# Patient Record
Sex: Male | Born: 1965 | Race: White | Hispanic: No | Marital: Married | Smoking: Former smoker
Health system: Southern US, Community
[De-identification: ages and names within clinical notes are randomized; demographics above are authoritative.]

## PROBLEM LIST (undated history)

## (undated) DIAGNOSIS — A6920 Lyme disease, unspecified: Secondary | ICD-10-CM

## (undated) DIAGNOSIS — G4733 Obstructive sleep apnea (adult) (pediatric): Secondary | ICD-10-CM

## (undated) DIAGNOSIS — R944 Abnormal results of kidney function studies: Secondary | ICD-10-CM

## (undated) DIAGNOSIS — R519 Headache, unspecified: Secondary | ICD-10-CM

## (undated) DIAGNOSIS — T7840XA Allergy, unspecified, initial encounter: Secondary | ICD-10-CM

## (undated) DIAGNOSIS — D125 Benign neoplasm of sigmoid colon: Secondary | ICD-10-CM

## (undated) DIAGNOSIS — I5022 Chronic systolic (congestive) heart failure: Secondary | ICD-10-CM

## (undated) DIAGNOSIS — K219 Gastro-esophageal reflux disease without esophagitis: Secondary | ICD-10-CM

## (undated) DIAGNOSIS — D120 Benign neoplasm of cecum: Secondary | ICD-10-CM

## (undated) DIAGNOSIS — M1990 Unspecified osteoarthritis, unspecified site: Secondary | ICD-10-CM

## (undated) DIAGNOSIS — D122 Benign neoplasm of ascending colon: Secondary | ICD-10-CM

## (undated) DIAGNOSIS — Z87891 Personal history of nicotine dependence: Secondary | ICD-10-CM

## (undated) DIAGNOSIS — R7303 Prediabetes: Secondary | ICD-10-CM

## (undated) DIAGNOSIS — I428 Other cardiomyopathies: Secondary | ICD-10-CM

## (undated) DIAGNOSIS — G4730 Sleep apnea, unspecified: Secondary | ICD-10-CM

## (undated) DIAGNOSIS — Z0000 Encounter for general adult medical examination without abnormal findings: Secondary | ICD-10-CM

## (undated) DIAGNOSIS — D123 Benign neoplasm of transverse colon: Secondary | ICD-10-CM

## (undated) DIAGNOSIS — R51 Headache: Secondary | ICD-10-CM

## (undated) DIAGNOSIS — I2510 Atherosclerotic heart disease of native coronary artery without angina pectoris: Secondary | ICD-10-CM

## (undated) DIAGNOSIS — Z87442 Personal history of urinary calculi: Secondary | ICD-10-CM

## (undated) DIAGNOSIS — Q625 Duplication of ureter: Secondary | ICD-10-CM

## (undated) HISTORY — DX: Other cardiomyopathies: I42.8

## (undated) HISTORY — DX: Allergy, unspecified, initial encounter: T78.40XA

## (undated) HISTORY — DX: Benign neoplasm of transverse colon: D12.3

## (undated) HISTORY — PX: HERNIA REPAIR: SHX51

## (undated) HISTORY — PX: ESOPHAGOGASTRODUODENOSCOPY: SHX1529

## (undated) HISTORY — DX: Prediabetes: R73.03

## (undated) HISTORY — DX: Encounter for general adult medical examination without abnormal findings: Z00.00

## (undated) HISTORY — DX: Gastro-esophageal reflux disease without esophagitis: K21.9

## (undated) HISTORY — DX: Lyme disease, unspecified: A69.20

## (undated) HISTORY — DX: Benign neoplasm of sigmoid colon: D12.5

## (undated) HISTORY — DX: Chronic systolic (congestive) heart failure: I50.22

## (undated) HISTORY — PX: VASECTOMY: SHX75

## (undated) HISTORY — DX: Benign neoplasm of cecum: D12.0

## (undated) HISTORY — DX: Sleep apnea, unspecified: G47.30

## (undated) HISTORY — DX: Personal history of nicotine dependence: Z87.891

## (undated) HISTORY — PX: WISDOM TOOTH EXTRACTION: SHX21

## (undated) HISTORY — DX: Obstructive sleep apnea (adult) (pediatric): G47.33

## (undated) HISTORY — DX: Atherosclerotic heart disease of native coronary artery without angina pectoris: I25.10

## (undated) HISTORY — DX: Benign neoplasm of ascending colon: D12.2

## (undated) HISTORY — PX: CARDIAC CATHETERIZATION: SHX172

## (undated) HISTORY — DX: Abnormal results of kidney function studies: R94.4

---

## 2009-08-20 ENCOUNTER — Ambulatory Visit: Payer: Self-pay | Admitting: Internal Medicine

## 2013-08-19 ENCOUNTER — Ambulatory Visit: Payer: Self-pay | Admitting: Gastroenterology

## 2014-09-14 ENCOUNTER — Encounter: Payer: Self-pay | Admitting: Emergency Medicine

## 2014-09-14 DIAGNOSIS — Z720 Tobacco use: Secondary | ICD-10-CM | POA: Diagnosis not present

## 2014-09-14 DIAGNOSIS — R109 Unspecified abdominal pain: Secondary | ICD-10-CM | POA: Diagnosis present

## 2014-09-14 DIAGNOSIS — N200 Calculus of kidney: Secondary | ICD-10-CM | POA: Diagnosis not present

## 2014-09-14 LAB — CBC
HEMATOCRIT: 48.6 % (ref 40.0–52.0)
HEMOGLOBIN: 16.5 g/dL (ref 13.0–18.0)
MCH: 30.1 pg (ref 26.0–34.0)
MCHC: 33.9 g/dL (ref 32.0–36.0)
MCV: 88.7 fL (ref 80.0–100.0)
Platelets: 255 10*3/uL (ref 150–440)
RBC: 5.48 MIL/uL (ref 4.40–5.90)
RDW: 13.8 % (ref 11.5–14.5)
WBC: 18.3 10*3/uL — ABNORMAL HIGH (ref 3.8–10.6)

## 2014-09-14 LAB — BASIC METABOLIC PANEL
ANION GAP: 11 (ref 5–15)
BUN: 13 mg/dL (ref 6–20)
CHLORIDE: 102 mmol/L (ref 101–111)
CO2: 26 mmol/L (ref 22–32)
CREATININE: 1.31 mg/dL — AB (ref 0.61–1.24)
Calcium: 9.4 mg/dL (ref 8.9–10.3)
GFR calc Af Amer: >60 mL/min (ref >60)
Glucose, Bld: 146 mg/dL — ABNORMAL HIGH (ref 65–99)
Potassium: 4.1 mmol/L (ref 3.5–5.1)
Sodium: 139 mmol/L (ref 135–145)

## 2014-09-14 LAB — URINALYSIS COMPLETE WITH MICROSCOPIC (ARMC ONLY)
Bacteria, UA: NONE SEEN
Bilirubin Urine: NEGATIVE
Glucose, UA: NEGATIVE mg/dL
Hgb urine dipstick: NEGATIVE
Nitrite: NEGATIVE
PH: 7 (ref 5.0–8.0)
Protein, ur: 30 mg/dL — AB
Specific Gravity, Urine: 1.026 (ref 1.005–1.030)

## 2014-09-14 LAB — LIPASE, BLOOD: LIPASE: 39 U/L (ref 22–51)

## 2014-09-14 MED ORDER — KETOROLAC TROMETHAMINE 60 MG/2ML IM SOLN
INTRAMUSCULAR | Status: AC
  Filled 2014-09-14: qty 2

## 2014-09-14 MED ORDER — FENTANYL CITRATE (PF) 100 MCG/2ML IJ SOLN
50.0000 ug | Freq: Once | INTRAMUSCULAR | Status: AC
  Administered 2014-09-14: 50 ug via INTRAVENOUS
  Filled 2014-09-14: qty 2

## 2014-09-14 MED ORDER — ONDANSETRON HCL 4 MG/2ML IJ SOLN
4.0000 mg | Freq: Once | INTRAMUSCULAR | Status: AC | PRN
  Administered 2014-09-14: 4 mg via INTRAVENOUS
  Filled 2014-09-14: qty 2

## 2014-09-14 MED ORDER — KETOROLAC TROMETHAMINE 30 MG/ML IJ SOLN
30.0000 mg | Freq: Once | INTRAMUSCULAR | Status: AC
  Administered 2014-09-14: 30 mg via INTRAVENOUS

## 2014-09-14 NOTE — ED Notes (Signed)
Patient with left flank pain a couple hours ago. Patient with nausea and vomiting. Patient reports previous kidney stones and this feels the same.

## 2014-09-15 ENCOUNTER — Emergency Department
Admission: EM | Admit: 2014-09-15 | Discharge: 2014-09-15 | Disposition: A | Discharge status: Home / Self Care | Payer: 59 | Attending: Emergency Medicine | Admitting: Emergency Medicine

## 2014-09-15 ENCOUNTER — Encounter: Payer: Self-pay | Admitting: Emergency Medicine

## 2014-09-15 DIAGNOSIS — N200 Calculus of kidney: Secondary | ICD-10-CM

## 2014-09-15 MED ORDER — TAMSULOSIN HCL 0.4 MG PO CAPS: 0.4000 mg | ORAL_CAPSULE | Freq: Every day | ORAL | Status: DC

## 2014-09-15 MED ORDER — HYDROMORPHONE HCL 1 MG/ML IJ SOLN
INTRAMUSCULAR | Status: AC
  Administered 2014-09-15: 1 mg via INTRAVENOUS
  Filled 2014-09-15: qty 1

## 2014-09-15 MED ORDER — ONDANSETRON HCL 4 MG PO TABS: 4.0000 mg | ORAL_TABLET | Freq: Every day | ORAL | Status: DC | PRN

## 2014-09-15 MED ORDER — HYDROMORPHONE HCL 1 MG/ML IJ SOLN
1.0000 mg | Freq: Once | INTRAMUSCULAR | Status: AC
  Administered 2014-09-15: 1 mg via INTRAVENOUS

## 2014-09-15 MED ORDER — FENTANYL CITRATE (PF) 100 MCG/2ML IJ SOLN: 50.0000 ug | Freq: Once | INTRAMUSCULAR | Status: DC

## 2014-09-15 MED ORDER — OXYCODONE-ACETAMINOPHEN 5-325 MG PO TABS: 1.0000 | ORAL_TABLET | ORAL | Status: DC | PRN

## 2014-09-15 NOTE — ED Provider Notes (Signed)
[facility] Emergency Department Provider Note    ____________________________________________  Time seen: 1315  I have reviewed the triage vital signs and the nursing notes.   HISTORY  Chief Complaint Nephrolithiasis   History limited by: Not Limited   HPI Willie Hess is a 49 y.o. male who presents with left flank pain. He states the pain started roughly 6 hours ago. It does radiate into his groin. It is severe. It states it does remind him of his previous kidney stones. He has had some associated nausea and vomiting. Denies any fevers.     No past medical history on file.  There are no active problems to display for this patient.   No past surgical history on file.  No current outpatient prescriptions on file.  Allergies Review of patient's allergies indicates no known allergies.  No family history on file.  Social History History  Substance Use Topics  . Smoking status: Current Every Day Smoker -- 1.00 packs/day for 30 years    Types: Cigarettes  . Smokeless tobacco: Not on file  . Alcohol Use: No    Review of Systems  Constitutional: Negative for fever. Cardiovascular: Negative for chest pain. Respiratory: Negative for shortness of breath. Gastrointestinal: Left flank pain. Nausea. Vomiting. Genitourinary: Negative for dysuria. Musculoskeletal: Negative for back pain. Skin: Negative for rash. Neurological: Negative for headaches, focal weakness or numbness.   10-point ROS otherwise negative.  ____________________________________________   PHYSICAL EXAM:  VITAL SIGNS: ED Triage Vitals  Enc Vitals Group     BP 09/14/14 2144 132/77 mmHg     Pulse Rate 09/14/14 2144 68     Resp 09/14/14 2144 20     Temp 09/14/14 2144 97.9 F (36.6 C)     Temp Source 09/14/14 2144 Oral     SpO2 --      Weight 09/14/14 2144 185 lb (83.915 kg)     Height 09/14/14 2144 6\' 2"  (1.88 m)     Head Cir --      Peak Flow --      Pain  Score 09/14/14 2145 10   Constitutional: Alert and oriented. Well appearing and in no distress. Eyes: Conjunctivae are normal. PERRL. Normal extraocular movements. ENT   Head: Normocephalic and atraumatic.   Nose: No congestion/rhinnorhea.   Mouth/Throat: Mucous membranes are moist.   Neck: No stridor. Hematological/Lymphatic/Immunilogical: No cervical lymphadenopathy. Cardiovascular: Normal rate, regular rhythm.  No murmurs, rubs, or gallops. Respiratory: Normal respiratory effort without tachypnea nor retractions. Breath sounds are clear and equal bilaterally. No wheezes/rales/rhonchi. Gastrointestinal: Soft and nontender. No distention. Mild CVA tenderness on the left side. Genitourinary: Deferred Musculoskeletal: Normal range of motion in all extremities. No joint effusions.  No lower extremity tenderness nor edema. Neurologic:  Normal speech and language. No gross focal neurologic deficits are appreciated. Speech is normal.  Skin:  Skin is warm, dry and intact. No rash noted. Psychiatric: Mood and affect are normal. Speech and behavior are normal. Patient exhibits appropriate insight and judgment.  ____________________________________________    LABS (pertinent positives/negatives)  Labs Reviewed  BASIC METABOLIC PANEL - Abnormal; Notable for the following:    Glucose, Bld 146 (*)    Creatinine, Ser 1.31 (*)    All other components within normal limits  CBC - Abnormal; Notable for the following:    WBC 18.3 (*)    All other components within normal limits  URINALYSIS COMPLETEWITH MICROSCOPIC ([facility] ONLY) - Abnormal; Notable for the following:    Color, Urine  YELLOW (*)    APPearance CLEAR (*)    Ketones, ur 1+ (*)    Protein, ur 30 (*)    Leukocytes, UA TRACE (*)    Squamous Epithelial / LPF 0-5 (*)    All other components within normal limits  LIPASE, BLOOD      ____________________________________________   EKG  None  ____________________________________________    RADIOLOGY  None  ____________________________________________   PROCEDURES  Procedure(s) performed: None  Critical Care performed: No  ____________________________________________   INITIAL IMPRESSION / ASSESSMENT AND PLAN / ED COURSE  Pertinent labs & imaging results that were available during my care of the patient were reviewed by me and considered in my medical decision making (see chart for details).  Patient presents with complaints of left flank pain. He states feels similar to his previous kidney stones. Bedside ultrasound did not show any hydronephrosis. There was a kidney stone within the kidney. Blood work without any elevation of red or white blood cells. At this point will treat patient with antiemetics, pain medication and Flomax. Will give patient urology numbers for follow-up as needed.  ____________________________________________   FINAL CLINICAL IMPRESSION(S) / ED DIAGNOSES  Final diagnoses:  Kidney stone     Nance Pear, MD 09/15/14 [phone]

## 2014-09-15 NOTE — Discharge Instructions (Signed)
Please seek medical attention for any high fevers, chest pain, shortness of breath, change in behavior, persistent vomiting, bloody stool or any other new or concerning symptoms.  Dietary Guidelines to Help Prevent Kidney Stones Your risk of kidney stones can be decreased by adjusting the foods you eat. The most important thing you can do is drink enough fluid. You should drink enough fluid to keep your urine clear or pale yellow. The following guidelines provide specific information for the type of kidney stone you have had. GUIDELINES ACCORDING TO TYPE OF KIDNEY STONE Calcium Oxalate Kidney Stones  Reduce the amount of salt you eat. Foods that have a lot of salt cause your body to release excess calcium into your urine. The excess calcium can combine with a substance called oxalate to form kidney stones.  Reduce the amount of animal protein you eat if the amount you eat is excessive. Animal protein causes your body to release excess calcium into your urine. Ask your dietitian how much protein from animal sources you should be eating.  Avoid foods that are high in oxalates. If you take vitamins, they should have less than 500 mg of vitamin C. Your body turns vitamin C into oxalates. You do not need to avoid fruits and vegetables high in vitamin C. Calcium Phosphate Kidney Stones  Reduce the amount of salt you eat to help prevent the release of excess calcium into your urine.  Reduce the amount of animal protein you eat if the amount you eat is excessive. Animal protein causes your body to release excess calcium into your urine. Ask your dietitian how much protein from animal sources you should be eating.  Get enough calcium from food or take a calcium supplement (ask your dietitian for recommendations). Food sources of calcium that do not increase your risk of kidney stones include:  Broccoli.  Dairy products, such as cheese and yogurt.  Pudding. Uric Acid Kidney Stones  Do not have more  than 6 oz of animal protein per day. FOOD SOURCES Animal Protein Sources  Meat (all types).  Poultry.  Eggs.  Fish, seafood. Foods High in [employer] seasonings.  Soy sauce.  Teriyaki sauce.  Cured and processed meats.  Salted crackers and snack foods.  Fast food.  Canned soups and most canned foods. Foods High in Oxalates  Grains:  Amaranth.  Barley.  Grits.  Wheat germ.  Bran.  Buckwheat flour.  All bran cereals.  Pretzels.  Whole wheat bread.  Vegetables:  Beans (wax).  Beets and beet greens.  Collard greens.  Eggplant.  [place]ole.  Leeks.  Okra.  Parsley.  R[place]abagas.  Spinach.  Swiss chard.  Tomato paste.  Fried potatoes.  Sweet potatoes.  Fruits:  Red currants.  Figs.  Kiwi.  Rhubarb.  Meat and Other Protein Sources:  Beans (dried).  Soy burgers and other soybean products.  Miso.  Nuts (peanuts, almonds, pecans, cashews, hazelnuts).  Nut butters.  Sesame seeds and tahini (paste made of sesame seeds).  Poppy seeds.  Beverages:  Chocolate drink mixes.  Soy milk.  Instant iced tea.  Juices made from high-oxalate fruits or vegetables.  Other:  Carob.  Chocolate.  Fruitcake.  Marmalades. Document Released: 05/28/2010 Document Revised: 02/05/2013 Document Reviewed: 12/28/2012 [facility] Patient Information 2015 [place], [place]. This information is not intended to replace advice given to you by your health care provider. Make sure you discuss any questions you have with your health care provider.

## 2014-09-15 NOTE — ED Notes (Signed)
MD at bedside. 

## 2014-09-16 ENCOUNTER — Encounter: Payer: Self-pay | Admitting: Emergency Medicine

## 2014-09-16 ENCOUNTER — Emergency Department: Payer: 59

## 2014-09-16 ENCOUNTER — Emergency Department
Admission: EM | Admit: 2014-09-16 | Discharge: 2014-09-16 | Disposition: A | Discharge status: Home / Self Care | Payer: 59 | Attending: Emergency Medicine | Admitting: Emergency Medicine

## 2014-09-16 DIAGNOSIS — Z79899 Other long term (current) drug therapy: Secondary | ICD-10-CM | POA: Insufficient documentation

## 2014-09-16 DIAGNOSIS — Z720 Tobacco use: Secondary | ICD-10-CM | POA: Insufficient documentation

## 2014-09-16 DIAGNOSIS — N200 Calculus of kidney: Secondary | ICD-10-CM | POA: Diagnosis not present

## 2014-09-16 DIAGNOSIS — R109 Unspecified abdominal pain: Secondary | ICD-10-CM

## 2014-09-16 LAB — BASIC METABOLIC PANEL
Anion gap: 7 (ref 5–15)
BUN: 13 mg/dL (ref 6–20)
CHLORIDE: 107 mmol/L (ref 101–111)
CO2: 27 mmol/L (ref 22–32)
Calcium: 8.9 mg/dL (ref 8.9–10.3)
Creatinine, Ser: 1.6 mg/dL — ABNORMAL HIGH (ref 0.61–1.24)
GFR, EST AFRICAN AMERICAN: 57 mL/min — AB (ref >60)
GFR, EST NON AFRICAN AMERICAN: 49 mL/min — AB (ref >60)
GLUCOSE: 132 mg/dL — AB (ref 65–99)
POTASSIUM: 3.6 mmol/L (ref 3.5–5.1)
SODIUM: 141 mmol/L (ref 135–145)

## 2014-09-16 LAB — URINALYSIS COMPLETE WITH MICROSCOPIC (ARMC ONLY)
Bacteria, UA: NONE SEEN
Bilirubin Urine: NEGATIVE
Glucose, UA: NEGATIVE mg/dL
Hgb urine dipstick: NEGATIVE
Leukocytes, UA: NEGATIVE
Nitrite: NEGATIVE
PROTEIN: NEGATIVE mg/dL
SPECIFIC GRAVITY, URINE: 1.027 (ref 1.005–1.030)
pH: 5 (ref 5.0–8.0)

## 2014-09-16 LAB — CBC
HCT: 44 % (ref 40.0–52.0)
HEMOGLOBIN: 14.9 g/dL (ref 13.0–18.0)
MCH: 30.2 pg (ref 26.0–34.0)
MCHC: 33.8 g/dL (ref 32.0–36.0)
MCV: 89.3 fL (ref 80.0–100.0)
PLATELETS: 219 10*3/uL (ref 150–440)
RBC: 4.93 MIL/uL (ref 4.40–5.90)
RDW: 13.8 % (ref 11.5–14.5)
WBC: 10.1 10*3/uL (ref 3.8–10.6)

## 2014-09-16 MED ORDER — NAPROXEN 500 MG PO TABS: 500.0000 mg | ORAL_TABLET | Freq: Two times a day (BID) | ORAL | Status: DC

## 2014-09-16 MED ORDER — SODIUM CHLORIDE 0.9 % IV BOLUS (SEPSIS)
1000.0000 mL | Freq: Once | INTRAVENOUS | Status: AC
  Administered 2014-09-16: 1000 mL via INTRAVENOUS

## 2014-09-16 MED ORDER — KETOROLAC TROMETHAMINE 30 MG/ML IJ SOLN: 30.0000 mg | Freq: Once | INTRAMUSCULAR | Status: AC

## 2014-09-16 MED ORDER — KETOROLAC TROMETHAMINE 60 MG/2ML IM SOLN
INTRAMUSCULAR | Status: AC
Start: 2014-09-16 — End: 2014-09-16
  Administered 2014-09-16: 30 mg
  Filled 2014-09-16: qty 2

## 2014-09-16 NOTE — Discharge Instructions (Signed)
Flank Pain °Flank pain refers to pain that is located on the side of the body between the upper abdomen and the back. The pain may occur over a short period of time (acute) or may be long-term or reoccurring (chronic). It may be mild or severe. Flank pain can be caused by many things. °CAUSES  °Some of the more common causes of flank pain include: °· Muscle strains.   °· Muscle spasms.   °· A disease of your spine (vertebral disk disease).   °· A lung infection (pneumonia).   °· Fluid around your lungs (pulmonary edema).   °· A kidney infection.   °· Kidney stones.   °· A very painful skin rash caused by the chickenpox virus (shingles).   °· Gallbladder disease.   °HOME CARE INSTRUCTIONS  °Home care will depend on the cause of your pain. In general, °· Rest as directed by your caregiver. °· Drink enough fluids to keep your urine clear or pale yellow. °· Only take over-the-counter or prescription medicines as directed by your caregiver. Some medicines may help relieve the pain. °· Tell your caregiver about any changes in your pain. °· Follow up with your caregiver as directed. °SEEK IMMEDIATE MEDICAL CARE IF:  °· Your pain is not controlled with medicine.   °· You have new or worsening symptoms. °· Your pain increases.   °· You have abdominal pain.   °· You have shortness of breath.   °· You have persistent nausea or vomiting.   °· You have swelling in your abdomen.   °· You feel faint or pass out.   °· You have blood in your urine. °· You have a fever or persistent symptoms for more than 2-3 days. °· You have a fever and your symptoms suddenly get worse. °MAKE SURE YOU:  °· Understand these instructions. °· Will watch your condition. °· Will get help right away if you are not doing well or get worse. °Document Released: 03/24/2005 Document Revised: 10/26/2011 Document Reviewed: 09/15/2011 °[employer]® Patient Information ©2015 [employer]. This information is not intended to replace advice given to you by your  health care provider. Make sure you discuss any questions you have with your health care provider. ° °

## 2014-09-16 NOTE — ED Notes (Signed)
Pt comes into the ED c/o left flank pain.  Patient has a history of kidney stones x4 times.  Patient says he has been doubled over since this morning with the pain.  He has taken 4 oxycodone today with no relief.  Patient was seen here on Sunday with this diagnosis.

## 2014-09-16 NOTE — ED Provider Notes (Signed)
[facility] Emergency Department Provider Note  ____________________________________________  Time seen: 2 PM  I have reviewed the triage vital signs and the nursing notes.   HISTORY  Chief Complaint Flank Pain    HPI Willie Hess is a 49 y.o. male who presents with left flank pain. Patient wears a history of kidney stones and this feels similar. He was seen in the emergency department 2 days prior and had an ultrasound which did not show any swelling. He had been doing well but today his pain became severe and not responsive to oxycodone. No fevers no chills. No history of diabetes.He denies hematuria     Past Medical History  Diagnosis Date  . Kidney stone     There are no active problems to display for this patient.   History reviewed. No pertinent past surgical history.  Current Outpatient Rx  Name  Route  Sig  Dispense  Refill  . naproxen (NAPROSYN) 500 MG tablet   Oral   Take 1 tablet (500 mg total) by mouth 2 (two) times daily with a meal.   20 tablet   2   . ondansetron (ZOFRAN) 4 MG tablet   Oral   Take 1 tablet (4 mg total) by mouth daily as needed for nausea or vomiting.   20 tablet   0   . oxyCODONE-acetaminophen (ROXICET) 5-325 MG per tablet   Oral   Take 1 tablet by mouth every 4 (four) hours as needed for severe pain.   20 tablet   0   . tamsulosin (FLOMAX) 0.4 MG CAPS capsule   Oral   Take 1 capsule (0.4 mg total) by mouth daily.   14 capsule   0     Allergies Bee venom  No family history on file.  Social History History  Substance Use Topics  . Smoking status: Current Every Day Smoker -- 1.00 packs/day for 30 years    Types: Cigarettes  . Smokeless tobacco: Never Used  . Alcohol Use: No    Review of Systems  Constitutional: Negative for fever. Eyes: Negative for visual changes. ENT: Negative for sore throat Cardiovascular: Negative for chest pain. Respiratory: Negative for shortness of  breath. Gastrointestinal: Left flank pain Genitourinary: Negative for dysuria. Musculoskeletal: Negative for back pain. Skin: Negative for rash. Neurological: Negative for headaches  Psychiatric: No anxiety    ____________________________________________   PHYSICAL EXAM:  VITAL SIGNS: ED Triage Vitals  Enc Vitals Group     BP 09/16/14 1333 143/93 mmHg     Pulse Rate 09/16/14 1333 90     Resp 09/16/14 1333 20     Temp 09/16/14 1333 98.1 F (36.7 C)     Temp Source 09/16/14 1333 Oral     SpO2 09/16/14 1333 96 %     Weight 09/16/14 1333 185 lb (83.915 kg)     Height 09/16/14 1333 6\' 2"  (1.88 m)     Head Cir --      Peak Flow --      Pain Score 09/16/14 1333 9     Pain Loc --      Pain Edu? --      Excl. in [place]? --      Constitutional: Alert and oriented. Well appearing and in no distress. Pleasant and interactive Eyes: Conjunctivae are normal.  ENT   Head: Normocephalic and atraumatic.   Mouth/Throat: Mucous membranes are moist. Cardiovascular: Normal rate, regular rhythm. Normal and symmetric distal pulses are present in all extremities.  No murmurs, rubs, or gallops. Respiratory: Normal respiratory effort without tachypnea nor retractions. Breath sounds are clear and equal bilaterally.  Gastrointestinal: Soft and non-tender in all quadrants. No distention. There is no CVA tenderness. Genitourinary: deferred Musculoskeletal: Nontender with normal range of motion in all extremities. No lower extremity tenderness nor edema. Neurologic:  Normal speech and language. No gross focal neurologic deficits are appreciated. Skin:  Skin is warm, dry and intact. No rash noted. Psychiatric: Mood and affect are normal. Patient exhibits appropriate insight and judgment.  ____________________________________________    LABS (pertinent positives/negatives)  Labs Reviewed  BASIC METABOLIC PANEL - Abnormal; Notable for the following:    Glucose, Bld 132 (*)    Creatinine, Ser  1.60 (*)    GFR calc non Af Amer 49 (*)    GFR calc Af Amer 57 (*)    All other components within normal limits  URINALYSIS COMPLETEWITH MICROSCOPIC ([facility] ONLY) - Abnormal; Notable for the following:    Color, Urine YELLOW (*)    APPearance CLEAR (*)    Ketones, ur TRACE (*)    Squamous Epithelial / LPF 0-5 (*)    All other components within normal limits  CBC    ____________________________________________   EKG None  ____________________________________________    RADIOLOGY I have personally reviewed any xrays that were ordered on this patient: CT shows 3 mm left UVJ stone  ____________________________________________   PROCEDURES  Procedure(s) performed: none  Critical Care performed: none  ____________________________________________   INITIAL IMPRESSION / ASSESSMENT AND PLAN / ED COURSE  Pertinent labs & imaging results that were available during my care of the patient were reviewed by me and considered in my medical decision making (see chart for details).  Patient received IV Toradol in the emergency department with significant relief of his pain. He also received 1 L IV fluids. CT demonstrates 3 mm stone. The bacteria in his urine no fever normal white count. Patient is okay for discharge with return precautions given.  ____________________________________________   FINAL CLINICAL IMPRESSION(S) / ED DIAGNOSES  Final diagnoses:  Flank pain  Kidney stone     Lavonia Drafts, MD 09/16/14 1646

## 2014-09-19 ENCOUNTER — Other Ambulatory Visit: Payer: 59

## 2014-09-19 ENCOUNTER — Encounter: Payer: Self-pay | Admitting: *Deleted

## 2014-09-19 NOTE — Patient Instructions (Signed)
  Your procedure is scheduled on: 09-23-14 Report to [facility] To find out your arrival time please call [phone] between 1PM - 3PM on 09-22-14  Remember: Instructions that are not followed completely may result in serious medical risk, up to and including death, or upon the discretion of your surgeon and anesthesiologist your surgery may need to be rescheduled.    _X___ 1. Do not eat food or drink liquids after midnight. No gum chewing or hard candies.     _X___ 2. No Alcohol for 24 hours before or after surgery.   ____ 3. Bring all medications with you on the day of surgery if instructed.    ____ 4. Notify your doctor if there is any change in your medical condition     (cold, fever, infections).     Do not wear jewelry, make-up, hairpins, clips or nail polish.  Do not wear lotions, powders, or perfumes. You may wear deodorant.  Do not shave 48 hours prior to surgery. Men may shave face and neck.  Do not bring valuables to the hospital.    [facility] is not responsible for any belongings or valuables.               Contacts, dentures or bridgework may not be worn into surgery.  Leave your suitcase in the car. After surgery it may be brought to your room.  For patients admitted to the hospital, discharge time is determined by your  treatment team.   Patients discharged the day of surgery will not be allowed to drive home.   Please read over the following fact sheets that you were given:      ____ Take these medicines the morning of surgery with A SIP OF WATER:    1. NONE  2.   3.   4.  5.  6.  ____ Fleet Enema (as directed)   ____ Use CHG Soap as directed  ____ Use inhalers on the day of surgery  ____ Stop metformin 2 days prior to surgery    ____ Take 1/2 of usual insulin dose the night before surgery and none on the morning of surgery.   ____ Stop Coumadin/Plavix/aspirin-N/A  ____ Stop Anti-inflammatories-NO NSAIDS OR ASA  PRODUCTS-NUCYNTA/TYLENOL OK TO TAKE   ____ Stop supplements until after surgery.    ____ Bring C-Pap to the hospital.

## 2014-09-22 NOTE — H&P (Signed)
NAMEKEETON, KASSEBAUM                 ACCOUNT NO.:  [id number]  MEDICAL RECORD NO.:  [id number]  LOCATION:  [facility]                        FACILITY:  [facility]  PHYSICIAN:  Maryan Puls          DATE OF BIRTH:  January 20, 1966  DATE OF ADMISSION:  09/23/2014 DATE OF DISCHARGE:                            HISTORY AND PHYSICAL   CHIEF COMPLAINT:  Flank pain.  HISTORY OF PRESENT ILLNESS:  The patient is a 49 year old, Caucasian male, with sudden onset of left flank pain radiating anteriorly and associated with nausea and vomiting on July 31st.  CT scan was performed at that time, indicating presence of a 4 x 3 mm distal left ureteral stone with hydronephrosis.  Patient comes in now for left ureteroscopic ureterolithotomy with holmium laser lithotripsy.  PAST MEDICAL HISTORY:  No drug allergies.  CURRENT MEDICATIONS:  Zegerid, oxycodone, naproxen, and tamsulosin.  Previous surgical procedures included vasectomy 2011, ureteroscopic stone retrieval 1992, 1989, and 1999.  SOCIAL HISTORY:  Patient smokes a pack a day and has a 20 pack year history.  He denied alcohol use.  FAMILY HISTORY:  Remarkable for sister and father with heart disease.  PAST AND CURRENT MEDICAL CONDITIONS: 1. Allergic rhinitis. 2. Recurrent kidney stone disease.  REVIEW OF SYSTEMS:  Patient denied chest pain, shortness of breath, diabetes, stroke, or hypertension.  PHYSICAL EXAM:  General:  A well-nourished white male, in no distress.  HEENT:  Sclerae were clear.  Pupils are equally round, react to light and accommodation.  Extraocular movements were intact.  NECK:  Supple.  No palpable cervical adenopathy.  LUNGS:  Clear to auscultation.  CARDIOVASCULAR:  Regular rate without audible murmurs.  ABDOMEN:  Soft abdomen.  No abdominal tenderness.  GU AND RECTAL:  Deferred.  NEUROMUSCULAR:  Alert and oriented x3.  IMPRESSION:  Left ureterolithiasis with renal colic.  PLAN:  Left ureteroscopic ureterolithotomy  with holmium laser lithotripsy.          ______________________________ Maryan Puls     MW/MEDQ  D:  09/22/2014  T:  09/22/2014  Job:  [id number]

## 2014-09-23 ENCOUNTER — Encounter: Payer: Self-pay | Admitting: *Deleted

## 2014-09-23 ENCOUNTER — Encounter
Admission: RE | Disposition: A | Discharge status: Home / Self Care | Payer: Self-pay | Source: Ambulatory Visit | Attending: Urology

## 2014-09-23 ENCOUNTER — Ambulatory Visit
Admission: RE | Admit: 2014-09-23 | Discharge: 2014-09-23 | Disposition: A | Discharge status: Home / Self Care | Payer: 59 | Source: Ambulatory Visit | Attending: Urology | Admitting: Urology

## 2014-09-23 ENCOUNTER — Ambulatory Visit: Payer: 59 | Admitting: Anesthesiology

## 2014-09-23 DIAGNOSIS — N201 Calculus of ureter: Secondary | ICD-10-CM | POA: Diagnosis present

## 2014-09-23 DIAGNOSIS — N200 Calculus of kidney: Secondary | ICD-10-CM

## 2014-09-23 DIAGNOSIS — Z87442 Personal history of urinary calculi: Secondary | ICD-10-CM | POA: Diagnosis not present

## 2014-09-23 DIAGNOSIS — Q648 Other specified congenital malformations of urinary system: Secondary | ICD-10-CM | POA: Insufficient documentation

## 2014-09-23 DIAGNOSIS — Z79899 Other long term (current) drug therapy: Secondary | ICD-10-CM | POA: Insufficient documentation

## 2014-09-23 DIAGNOSIS — F17200 Nicotine dependence, unspecified, uncomplicated: Secondary | ICD-10-CM | POA: Insufficient documentation

## 2014-09-23 DIAGNOSIS — F17210 Nicotine dependence, cigarettes, uncomplicated: Secondary | ICD-10-CM | POA: Diagnosis not present

## 2014-09-23 DIAGNOSIS — R51 Headache: Secondary | ICD-10-CM | POA: Diagnosis not present

## 2014-09-23 DIAGNOSIS — N132 Hydronephrosis with renal and ureteral calculous obstruction: Secondary | ICD-10-CM | POA: Insufficient documentation

## 2014-09-23 DIAGNOSIS — K219 Gastro-esophageal reflux disease without esophagitis: Secondary | ICD-10-CM | POA: Insufficient documentation

## 2014-09-23 HISTORY — DX: Headache: R51

## 2014-09-23 HISTORY — PX: URETEROSCOPY WITH HOLMIUM LASER LITHOTRIPSY: SHX6645

## 2014-09-23 HISTORY — DX: Gastro-esophageal reflux disease without esophagitis: K21.9

## 2014-09-23 HISTORY — DX: Headache, unspecified: R51.9

## 2014-09-23 SURGERY — URETEROSCOPY, WITH LITHOTRIPSY USING HOLMIUM LASER
Anesthesia: General | Laterality: Left | Wound class: Clean Contaminated

## 2014-09-23 MED ORDER — FAMOTIDINE 20 MG PO TABS
20.0000 mg | ORAL_TABLET | Freq: Once | ORAL | Status: AC
  Administered 2014-09-23: 20 mg via ORAL

## 2014-09-23 MED ORDER — NUCYNTA 50 MG PO TABS
50.0000 mg | ORAL_TABLET | Freq: Four times a day (QID) | ORAL | Status: DC | PRN
Start: 2014-09-23 — End: 2014-12-11

## 2014-09-23 MED ORDER — LIDOCAINE HCL 2 % EX GEL
CUTANEOUS | Status: AC
  Filled 2014-09-23: qty 10

## 2014-09-23 MED ORDER — ONDANSETRON HCL 4 MG/2ML IJ SOLN: 4.0000 mg | Freq: Once | INTRAMUSCULAR | Status: DC | PRN

## 2014-09-23 MED ORDER — LIDOCAINE HCL (CARDIAC) 20 MG/ML IV SOLN
INTRAVENOUS | Status: DC | PRN
  Administered 2014-09-23: 60 mg via INTRAVENOUS

## 2014-09-23 MED ORDER — BELLADONNA ALKALOIDS-OPIUM 16.2-60 MG RE SUPP
RECTAL | Status: AC
  Filled 2014-09-23: qty 1

## 2014-09-23 MED ORDER — DOCUSATE SODIUM 100 MG PO CAPS: 200.0000 mg | ORAL_CAPSULE | Freq: Two times a day (BID) | ORAL | Status: DC

## 2014-09-23 MED ORDER — FENTANYL CITRATE (PF) 100 MCG/2ML IJ SOLN
INTRAMUSCULAR | Status: DC | PRN
  Administered 2014-09-23: 50 ug via INTRAVENOUS
  Administered 2014-09-23 (×4): 25 ug via INTRAVENOUS
  Administered 2014-09-23: 50 ug via INTRAVENOUS

## 2014-09-23 MED ORDER — LEVOFLOXACIN IN D5W 500 MG/100ML IV SOLN
INTRAVENOUS | Status: AC
  Filled 2014-09-23: qty 100

## 2014-09-23 MED ORDER — FENTANYL CITRATE (PF) 100 MCG/2ML IJ SOLN
INTRAMUSCULAR | Status: DC
Start: 2014-09-23 — End: 2014-09-23
  Filled 2014-09-23: qty 2

## 2014-09-23 MED ORDER — MIDAZOLAM HCL 5 MG/5ML IJ SOLN
INTRAMUSCULAR | Status: DC | PRN
  Administered 2014-09-23: 2 mg via INTRAVENOUS

## 2014-09-23 MED ORDER — FENTANYL CITRATE (PF) 100 MCG/2ML IJ SOLN
INTRAMUSCULAR | Status: AC
  Filled 2014-09-23: qty 2

## 2014-09-23 MED ORDER — HYDROMORPHONE HCL 1 MG/ML IJ SOLN
INTRAMUSCULAR | Status: DC
Start: 2014-09-23 — End: 2014-09-23
  Filled 2014-09-23: qty 1

## 2014-09-23 MED ORDER — HYOSCYAMINE SULFATE 0.125 MG SL SUBL
0.2500 mg | SUBLINGUAL_TABLET | SUBLINGUAL | Status: DC | PRN
  Administered 2014-09-23: 0.25 mg via SUBLINGUAL
  Filled 2014-09-23 (×2): qty 2

## 2014-09-23 MED ORDER — LIDOCAINE HCL 2 % EX GEL
CUTANEOUS | Status: DC | PRN
  Administered 2014-09-23: 1

## 2014-09-23 MED ORDER — SODIUM CHLORIDE 0.9 % IV SOLN
INTRAVENOUS | Status: DC | PRN
  Administered 2014-09-23: 45 mL

## 2014-09-23 MED ORDER — FAMOTIDINE 20 MG PO TABS
ORAL_TABLET | ORAL | Status: AC
  Administered 2014-09-23: 20 mg via ORAL
  Filled 2014-09-23: qty 1

## 2014-09-23 MED ORDER — PROPOFOL 10 MG/ML IV BOLUS
INTRAVENOUS | Status: DC | PRN
  Administered 2014-09-23: 30 mg via INTRAVENOUS
  Administered 2014-09-23: 170 mg via INTRAVENOUS
  Administered 2014-09-23: 30 mg via INTRAVENOUS

## 2014-09-23 MED ORDER — ONDANSETRON HCL 4 MG/2ML IJ SOLN
INTRAMUSCULAR | Status: DC | PRN
  Administered 2014-09-23: 4 mg via INTRAVENOUS

## 2014-09-23 MED ORDER — FENTANYL CITRATE (PF) 100 MCG/2ML IJ SOLN
25.0000 ug | INTRAMUSCULAR | Status: AC | PRN
  Administered 2014-09-23 (×6): 25 ug via INTRAVENOUS

## 2014-09-23 MED ORDER — LEVOFLOXACIN IN D5W 500 MG/100ML IV SOLN
500.0000 mg | Freq: Once | INTRAVENOUS | Status: AC
  Administered 2014-09-23: 500 mg via INTRAVENOUS

## 2014-09-23 MED ORDER — ONDANSETRON 8 MG PO TBDP: 8.0000 mg | ORAL_TABLET | Freq: Four times a day (QID) | ORAL | Status: DC | PRN

## 2014-09-23 MED ORDER — URIBEL 118 MG PO CAPS: 1.0000 | ORAL_CAPSULE | Freq: Four times a day (QID) | ORAL | Status: DC | PRN

## 2014-09-23 MED ORDER — SODIUM CHLORIDE 0.9 % IR SOLN
Status: DC | PRN
  Administered 2014-09-23: 3500 mL

## 2014-09-23 MED ORDER — BELLADONNA ALKALOIDS-OPIUM 16.2-60 MG RE SUPP
RECTAL | Status: DC | PRN
  Administered 2014-09-23: 1 via RECTAL

## 2014-09-23 MED ORDER — LACTATED RINGERS IV SOLN
INTRAVENOUS | Status: DC
  Administered 2014-09-23 (×2): via INTRAVENOUS

## 2014-09-23 MED ORDER — HYDROMORPHONE HCL 1 MG/ML IJ SOLN
INTRAMUSCULAR | Status: AC
  Filled 2014-09-23: qty 1

## 2014-09-23 MED ORDER — LEVOFLOXACIN IN D5W 500 MG/100ML IV SOLN: 500.0000 mg | INTRAVENOUS | Status: DC

## 2014-09-23 MED ORDER — LEVOFLOXACIN 500 MG PO TABS: 500.0000 mg | ORAL_TABLET | Freq: Every day | ORAL | Status: DC

## 2014-09-23 MED ORDER — HYDROMORPHONE HCL 1 MG/ML IJ SOLN
0.2500 mg | INTRAMUSCULAR | Status: DC | PRN
  Administered 2014-09-23 (×8): 0.25 mg via INTRAVENOUS

## 2014-09-23 SURGICAL SUPPLY — 31 items
BAG DRAIN CYSTO-URO LG1000N (MISCELLANEOUS) ×2 IMPLANT
BALLN URETL DIL 7X4 (MISCELLANEOUS) ×2
BALLOON URETL DIL 7X4 (MISCELLANEOUS) ×1 IMPLANT
CATH URETL 5X70 OPEN END (CATHETERS) ×2 IMPLANT
CNTNR SPEC 2.5X3XGRAD LEK (MISCELLANEOUS) ×1
CONRAY 43 FOR UROLOGY 50M (MISCELLANEOUS) ×2 IMPLANT
CONT SPEC 4OZ STER OR WHT (MISCELLANEOUS) ×1
CONTAINER SPEC 2.5X3XGRAD LEK (MISCELLANEOUS) ×1 IMPLANT
FEE TECHNICIAN ONLY PER HOUR (MISCELLANEOUS) IMPLANT
GLOVE BIO SURGEON STRL SZ7 (GLOVE) ×4 IMPLANT
GLOVE BIO SURGEON STRL SZ7.5 (GLOVE) ×2 IMPLANT
GOWN STRL REUS W/ TWL LRG LVL3 (GOWN DISPOSABLE) ×1 IMPLANT
GOWN STRL REUS W/TWL LRG LVL3 (GOWN DISPOSABLE) ×1
GUIDEWIRE STR ZIPWIRE 025X150 (MISCELLANEOUS) ×4 IMPLANT
GUIDEWIRE STR ZIPWIRE 035X150 (MISCELLANEOUS) ×2 IMPLANT
JELLY LUB 2OZ STRL (MISCELLANEOUS) ×1
JELLY LUBE 2OZ STRL (MISCELLANEOUS) ×1 IMPLANT
KIT RM TURNOVER CYSTO AR (KITS) ×2 IMPLANT
LASER HOLMIUM FIBER SU 272UM (MISCELLANEOUS) IMPLANT
LASER HOLMIUM STANDBY (MISCELLANEOUS) ×2 IMPLANT
LASER HOLMIUM SU 940UM (MISCELLANEOUS) IMPLANT
PACK CYSTO AR (MISCELLANEOUS) ×2 IMPLANT
PREP PVP WINGED SPONGE (MISCELLANEOUS) ×2 IMPLANT
SET CYSTO W/LG BORE CLAMP LF (SET/KITS/TRAYS/PACK) ×2 IMPLANT
SOL .9 NS 3000ML IRR  AL (IV SOLUTION) ×2
SOL .9 NS 3000ML IRR UROMATIC (IV SOLUTION) ×2 IMPLANT
SOL PREP PVP 2OZ (MISCELLANEOUS) ×2
SOLUTION PREP PVP 2OZ (MISCELLANEOUS) ×1 IMPLANT
STENT URET 6FRX26 CONTOUR (STENTS) ×4 IMPLANT
SYRINGE 10CC LL (SYRINGE) ×2 IMPLANT
WATER STERILE IRR 1000ML POUR (IV SOLUTION) ×2 IMPLANT

## 2014-09-23 NOTE — Anesthesia Procedure Notes (Signed)
Procedure Name: LMA Insertion Date/Time: 09/23/2014 2:29 PM Performed by: Dionne Bucy Pre-anesthesia Checklist: Patient identified, Patient being monitored, Timeout performed, Emergency Drugs available and Suction available Patient Re-evaluated:Patient Re-evaluated prior to inductionOxygen Delivery Method: Circle system utilized Preoxygenation: Pre-oxygenation with 100% oxygen Intubation Type: IV induction Ventilation: Mask ventilation without difficulty LMA: LMA inserted LMA Size: 5.0 Number of attempts: 1 Placement Confirmation: positive ETCO2 and breath sounds checked- equal and bilateral Tube secured with: Tape Dental Injury: Teeth and Oropharynx as per pre-operative assessment

## 2014-09-23 NOTE — Anesthesia Preprocedure Evaluation (Addendum)
Anesthesia Evaluation  Patient identified by MRN, date of birth, ID band Patient awake    Reviewed: Allergy & Precautions, H&P , NPO status , Patient's Chart, lab work & pertinent test results  Airway Mallampati: III  TM Distance: >3 FB Neck ROM: full    Dental no notable dental hx. (+) Teeth Intact   Pulmonary Current Smoker,  breath sounds clear to auscultation  Pulmonary exam normal       Cardiovascular Exercise Tolerance: Good negative cardio ROS Normal cardiovascular examRhythm:regular Rate:Normal     Neuro/Psych  Headaches, negative psych ROS   GI/Hepatic Neg liver ROS, GERD-  Controlled and Medicated,  Endo/Other  negative endocrine ROS  Renal/GU Renal disease  negative genitourinary   Musculoskeletal   Abdominal   Peds  Hematology negative hematology ROS (+)   Anesthesia Other Findings Past Medical History:   Kidney stone                                                 GERD (gastroesophageal reflux disease)                       Headache                                                     Reproductive/Obstetrics negative OB ROS                           Anesthesia Physical Anesthesia Plan  ASA: III  Anesthesia Plan: General LMA   Post-op Pain Management:    Induction:   Airway Management Planned:   Additional Equipment:   Intra-op Plan:   Post-operative Plan:   Informed Consent: I have reviewed the patients History and Physical, chart, labs and discussed the procedure including the risks, benefits and alternatives for the proposed anesthesia with the patient or authorized representative who has indicated his/her understanding and acceptance.   Dental Advisory Given  Plan Discussed with: Anesthesiologist, CRNA and Surgeon  Anesthesia Plan Comments:       Anesthesia Quick Evaluation

## 2014-09-23 NOTE — Transfer of Care (Signed)
Immediate Anesthesia Transfer of Care Note  Patient: Willie Hess  Procedure(s) Performed: Procedure(s): URETEROSCOPY, CYSTOSCOPY WITH STENT PLACEMENT, HOLMIUM STAND BY  (Left)  Patient Location: PACU  Anesthesia Type:General  Level of Consciousness: awake and patient cooperative  Airway & Oxygen Therapy: Patient Spontanous Breathing and Patient connected to face mask oxygen  Post-op Assessment: Report given to RN and Post -op Vital signs reviewed and stable  Post vital signs: Reviewed and stable  Last Vitals:  Filed Vitals:   09/23/14 1605  BP: 143/83  Pulse: 88  Temp: 97.4  Resp: 16    Complications: No apparent anesthesia complications

## 2014-09-23 NOTE — H&P (Signed)
Date of Initial H&P: 09/22/14  History reviewed, patient examined, no change in status, stable for surgery.

## 2014-09-23 NOTE — Op Note (Signed)
Preoperative diagnosis: Left ureterolithiasis Postoperative diagnosis: 1. Left ureterolithiasis                                              2. Left hydronephrosis with duplex collecting system  Procedure: 1. Left ureteroscopy                      2. Left retrograde pyelogram                       3. Placement of double pigtail stents into left duplex ureters   Surgeon: Otelia Limes. Yves Dill MD, FACS Anesthesia: Gen.  Indications:See the history and physical. After informed consent the above procedure(s) were requested     Technique and findings: After adequate general anesthesia been obtained patient was placed into dorsal lithotomy position and the perineum was prepped and draped in the usual fashion. Fluoroscopy revealed a possible distal left ureteral stone. At this point the 23 [place] the scope was coupled to the camera and then visually advanced into the bladder. Bladder was thoroughly inspected. No bladder tumors were identified. Right orifice was normally situated on the trigone and had clear reflux. The left hemitrigone was dilated and ureter dictated very distally on the trigone. A 0.025 Glidewire was then passed into the left ureter. The distal ureter was then dilated to 7 mm with balloon dilating catheter. The balloon was then deflated and the dilating catheter was removed taking care to leave the guidewire in position. The mini Stortz ureteroscope was then visually advanced into the bladder and into the distal left ureter. The patient appeared to have a duplex ureter that connected with in the intramural segment. The ureter that was balloon dilated was then inspected with the ureteroscope. There was significant edema of the intramural portion and proximal ureter was dilated. The ureteroscope was passed easily to the proximal ureter and stone was not identified. At this point the ureteroscope was packed out to the right of attachment to the duplicated ureter and a 0.025 Glidewire was passed through  this ureter. The ureteroscope was passed up this ureter and again edema was noted in the intramural segment but stone not identified all the way up to the proximal portion of this ureter. Retrograde pyelogram performed were performed of both ureters through the ureteroscope and no filling defects were identified. The cystoscope was then sequentially backloaded over either guidewire and 6 x 26 cm double pigtail stents were passed over both guidewires and positioned in both ureters under fluoroscopic guidance. Both guidewires were pulled taking care leave the stent in position. Sutures were left on the stent for later retrieval. Bladder was drained and the cystoscope was removed. 10 cc of viscous Xylocaine was instilled within the ureter and the bladder. A B&O suppository was placed. Procedure was then terminated and the patient was transferred to the recovery room in stable condition.

## 2014-09-23 NOTE — Discharge Instructions (Addendum)
Kidney Stones °Kidney stones (urolithiasis) are deposits that form inside your kidneys. The intense pain is caused by the stone moving through the urinary tract. When the stone moves, the ureter goes into spasm around the stone. The stone is usually passed in the urine.  °CAUSES  °· A disorder that makes certain neck glands produce too much parathyroid hormone (primary hyperparathyroidism). °· A buildup of uric acid crystals, similar to gout in your joints. °· Narrowing (stricture) of the ureter. °· A kidney obstruction present at birth (congenital obstruction). °· Previous surgery on the kidney or ureters. °· Numerous kidney infections. °SYMPTOMS  °· Feeling sick to your stomach (nauseous). °· Throwing up (vomiting). °· Blood in the urine (hematuria). °· Pain that usually spreads (radiates) to the groin. °· Frequency or urgency of urination. °DIAGNOSIS  °· Taking a history and physical exam. °· Blood or urine tests. °· CT scan. °· Occasionally, an examination of the inside of the urinary bladder (cystoscopy) is performed. °TREATMENT  °· Observation. °· Increasing your fluid intake. °· Extracorporeal shock wave lithotripsy--This is a noninvasive procedure that uses shock waves to break up kidney stones. °· Surgery may be needed if you have severe pain or persistent obstruction. There are various surgical procedures. Most of the procedures are performed with the use of small instruments. Only small incisions are needed to accommodate these instruments, so recovery time is minimized. °The size, location, and chemical composition are all important variables that will determine the proper choice of action for you. Talk to your health care provider to better understand your situation so that you will minimize the risk of injury to yourself and your kidney.  °HOME CARE INSTRUCTIONS  °· Drink enough water and fluids to keep your urine clear or pale yellow. This will help you to pass the stone or stone fragments. °· Strain  all urine through the provided strainer. Keep all particulate matter and stones for your health care provider to see. The stone causing the pain may be as small as a grain of salt. It is very important to use the strainer each and every time you pass your urine. The collection of your stone will allow your health care provider to analyze it and verify that a stone has actually passed. The stone analysis will often identify what you can do to reduce the incidence of recurrences. °· Only take over-the-counter or prescription medicines for pain, discomfort, or fever as directed by your health care provider. °· Make a follow-up appointment with your health care provider as directed. °· Get follow-up X-rays if required. The absence of pain does not always mean that the stone has passed. It may have only stopped moving. If the urine remains completely obstructed, it can cause loss of kidney function or even complete destruction of the kidney. It is your responsibility to make sure X-rays and follow-ups are completed. Ultrasounds of the kidney can show blockages and the status of the kidney. Ultrasounds are not associated with any radiation and can be performed easily in a matter of minutes. °SEEK MEDICAL CARE IF: °· You experience pain that is progressive and unresponsive to any pain medicine you have been prescribed. °SEEK IMMEDIATE MEDICAL CARE IF:  °· Pain cannot be controlled with the prescribed medicine. °· You have a fever or shaking chills. °· The severity or intensity of pain increases over 18 hours and is not relieved by pain medicine. °· You develop a new onset of abdominal pain. °· You feel faint or pass out. °·   You are unable to urinate. MAKE SURE YOU:   Understand these instructions.  Will watch your condition.  Will get help right away if you are not doing well or get worse. Document Released: 01/31/2005 Document Revised: 10/03/2012 Document Reviewed: 07/04/2012 [facility] Patient Information 2015  [place], [place]. This information is not intended to replace advice given to you by your health care provider. Make sure you discuss any questions you have with your health care provider. Ureteral Stent Implantation, Care After Refer to this sheet in the next few weeks. These instructions provide you with information on caring for yourself after your procedure. Your health care provider may also give you more specific instructions. Your treatment has been planned according to current medical practices, but problems sometimes occur. Call your health care provider if you have any problems or questions after your procedure. WHAT TO EXPECT AFTER THE PROCEDURE You should be back to normal activity within 48 hours after the procedure. Nausea and vomiting may occur and are commonly the result of anesthesia. It is common to experience sharp pain in the back or lower abdomen and penis with voiding. This is caused by movement of the ends of the stent with the act of urinating.It usually goes away within minutes after you have stopped urinating. HOME CARE INSTRUCTIONS Make sure to drink plenty of fluids. You may have small amounts of bleeding, causing your urine to be red. This is normal. Certain movements may trigger pain or a feeling that you need to urinate. You may be given medicines to prevent infection or bladder spasms. Be sure to take all medicines as directed. Only take over-the-counter or prescription medicines for pain, discomfort, or fever as directed by your health care provider. Do not take aspirin, as this can make bleeding worse. Your stent will be left in until the blockage is resolved. This may take 2 weeks or longer, depending on the reason for stent implantation. You may have an X-ray exam to make sure your ureter is open and that the stent has not moved out of position (migrated). The stent can be removed by your health care provider in the office. Medicines may be given for comfort while the stent  is being removed. Be sure to keep all follow-up appointments so your health care provider can check that you are healing properly. SEEK MEDICAL CARE IF:  You experience increasing pain.  Your pain medicine is not working. SEEK IMMEDIATE MEDICAL CARE IF:  Your urine is dark red or has blood clots.  You are leaking urine (incontinent).  You have a fever, chills, feeling sick to your stomach (nausea), or vomiting.  Your pain is not relieved by pain medicine.  The end of the stent comes out of the urethra.  You are unable to urinate. Document Released: 10/03/2012 Document Revised: 02/05/2013 Document Reviewed: 10/03/2012 [facility] Patient Information 2015 [place], [place]. This information is not intended to replace advice given to you by your health care provider. Make sure you discuss any questions you have with your health care provider. Ureteral Stent Implantation, Care After Refer to this sheet in the next few weeks. These instructions provide you with information on caring for yourself after your procedure. Your health care provider may also give you more specific instructions. Your treatment has been planned according to current medical practices, but problems sometimes occur. Call your health care provider if you have any problems or questions after your procedure. WHAT TO EXPECT AFTER THE PROCEDURE You should be back to normal activity  within 48 hours after the procedure. Nausea and vomiting may occur and are commonly the result of anesthesia. It is common to experience sharp pain in the back or lower abdomen and penis with voiding. This is caused by movement of the ends of the stent with the act of urinating.It usually goes away within minutes after you have stopped urinating. HOME CARE INSTRUCTIONS Make sure to drink plenty of fluids. You may have small amounts of bleeding, causing your urine to be red. This is normal. Certain movements may trigger pain or a feeling that you need to  urinate. You may be given medicines to prevent infection or bladder spasms. Be sure to take all medicines as directed. Only take over-the-counter or prescription medicines for pain, discomfort, or fever as directed by your health care provider. Do not take aspirin, as this can make bleeding worse. Your stent will be left in until the blockage is resolved. This may take 2 weeks or longer, depending on the reason for stent implantation. You may have an X-ray exam to make sure your ureter is open and that the stent has not moved out of position (migrated). The stent can be removed by your health care provider in the office. Medicines may be given for comfort while the stent is being removed. Be sure to keep all follow-up appointments so your health care provider can check that you are healing properly. SEEK MEDICAL CARE IF:  You experience increasing pain.  Your pain medicine is not working. SEEK IMMEDIATE MEDICAL CARE IF:  Your urine is dark red or has blood clots.  You are leaking urine (incontinent).  You have a fever, chills, feeling sick to your stomach (nausea), or vomiting.  Your pain is not relieved by pain medicine.  The end of the stent comes out of the urethra.  You are unable to urinate. Document Released: 10/03/2012 Document Revised: 02/05/2013 Document Reviewed: 10/03/2012 [facility] Patient Information 2015 [place], [place]. This information is not intended to replace advice given to you by your health care provider. Make sure you discuss any questions you have with your health care provider. Ureteral Stent Implantation, Care After Refer to this sheet in the next few weeks. These instructions provide you with information on caring for yourself after your procedure. Your health care provider may also give you more specific instructions. Your treatment has been planned according to current medical practices, but problems sometimes occur. Call your health care provider if you have any  problems or questions after your procedure. WHAT TO EXPECT AFTER THE PROCEDURE You should be back to normal activity within 48 hours after the procedure. Nausea and vomiting may occur and are commonly the result of anesthesia. It is common to experience sharp pain in the back or lower abdomen and penis with voiding. This is caused by movement of the ends of the stent with the act of urinating.It usually goes away within minutes after you have stopped urinating. HOME CARE INSTRUCTIONS Make sure to drink plenty of fluids. You may have small amounts of bleeding, causing your urine to be red. This is normal. Certain movements may trigger pain or a feeling that you need to urinate. You may be given medicines to prevent infection or bladder spasms. Be sure to take all medicines as directed. Only take over-the-counter or prescription medicines for pain, discomfort, or fever as directed by your health care provider. Do not take aspirin, as this can make bleeding worse. Your stent will be left in until the blockage is  resolved. This may take 2 weeks or longer, depending on the reason for stent implantation. You may have an X-ray exam to make sure your ureter is open and that the stent has not moved out of position (migrated). The stent can be removed by your health care provider in the office. Medicines may be given for comfort while the stent is being removed. Be sure to keep all follow-up appointments so your health care provider can check that you are healing properly. SEEK MEDICAL CARE IF:  You experience increasing pain.  Your pain medicine is not working. SEEK IMMEDIATE MEDICAL CARE IF:  Your urine is dark red or has blood clots.  You are leaking urine (incontinent).  You have a fever, chills, feeling sick to your stomach (nausea), or vomiting.  Your pain is not relieved by pain medicine.  The end of the stent comes out of the urethra.  You are unable to urinate. AMBULATORY SURGERY  DISCHARGE  INSTRUCTIONS   1) The drugs that you were given will stay in your system until tomorrow so for the next 24 hours you should not:  A) Drive an automobile B) Make any legal decisions C) Drink any alcoholic beverage   2) You may resume regular meals tomorrow.  Today it is better to start with liquids and gradually work up to solid foods.  You may eat anything you prefer, but it is better to start with liquids, then soup and crackers, and gradually work up to solid foods.   3) Please notify your doctor immediately if you have any unusual bleeding, trouble breathing, redness and pain at the surgery site, drainage, fever, or pain not relieved by medication.    4) Additional Instructions:  Call the office to schedule a return appointment for one week from today to have stents removed.   Please contact your physician with any problems or Same Day Surgery at [phone], Monday through Friday 6 am to 4 pm, or [place] at [facility] number at [phone].

## 2014-09-24 ENCOUNTER — Encounter: Payer: Self-pay | Admitting: Urology

## 2014-09-24 NOTE — Anesthesia Postprocedure Evaluation (Signed)
  Anesthesia Post-op Note  Patient: Willie Hess  Procedure(s) Performed: Procedure(s): URETEROSCOPY, CYSTOSCOPY WITH STENT PLACEMENT, HOLMIUM STAND BY  (Left)  Anesthesia type:General LMA  Patient location: PACU  Post pain: Pain level controlled  Post assessment: Post-op Vital signs reviewed, Patient's Cardiovascular Status Stable, Respiratory Function Stable, Patent Airway and No signs of Nausea or vomiting  Post vital signs: Reviewed and stable  Last Vitals:  Filed Vitals:   09/23/14 1824  BP: 131/78  Pulse: 75  Temp:   Resp: 16    Level of consciousness: awake, alert  and patient cooperative  Complications: No apparent anesthesia complications

## 2014-11-17 ENCOUNTER — Telehealth: Payer: Self-pay | Admitting: *Deleted

## 2014-11-17 NOTE — Telephone Encounter (Signed)
  Oncology Nurse Navigator Documentation    Navigator Encounter Type: Telephone;Screening (11/17/14 1200)      Notified patient that due to age being below the target age of 64-77 for lung cancer screening, he does not meet criteria for being at higher risk for lung cancer and being eligible for lung cancer screening scan. Encouraged to participate in smoking cessation program offered by the hospital and to have screening scan when he is 49 years old. Will forward to referring provider.

## 2014-12-11 ENCOUNTER — Encounter (INDEPENDENT_AMBULATORY_CARE_PROVIDER_SITE_OTHER): Payer: Self-pay

## 2014-12-11 ENCOUNTER — Ambulatory Visit (INDEPENDENT_AMBULATORY_CARE_PROVIDER_SITE_OTHER): Payer: 59 | Admitting: Family Medicine

## 2014-12-11 ENCOUNTER — Encounter: Payer: Self-pay | Admitting: Family Medicine

## 2014-12-11 VITALS — BP 130/76 | HR 85 | Temp 98.3°F | Resp 16 | Ht 74.0 in | Wt 186.6 lb

## 2014-12-11 DIAGNOSIS — R944 Abnormal results of kidney function studies: Secondary | ICD-10-CM | POA: Diagnosis not present

## 2014-12-11 DIAGNOSIS — Z720 Tobacco use: Secondary | ICD-10-CM

## 2014-12-11 DIAGNOSIS — Z87442 Personal history of urinary calculi: Secondary | ICD-10-CM

## 2014-12-11 DIAGNOSIS — Z23 Encounter for immunization: Secondary | ICD-10-CM

## 2014-12-11 DIAGNOSIS — F17210 Nicotine dependence, cigarettes, uncomplicated: Secondary | ICD-10-CM | POA: Insufficient documentation

## 2014-12-11 DIAGNOSIS — K219 Gastro-esophageal reflux disease without esophagitis: Secondary | ICD-10-CM

## 2014-12-11 DIAGNOSIS — IMO0001 Reserved for inherently not codable concepts without codable children: Secondary | ICD-10-CM | POA: Insufficient documentation

## 2014-12-11 DIAGNOSIS — Z716 Tobacco abuse counseling: Secondary | ICD-10-CM | POA: Diagnosis not present

## 2014-12-11 HISTORY — DX: Gastro-esophageal reflux disease without esophagitis: K21.9

## 2014-12-11 HISTORY — DX: Abnormal results of kidney function studies: R94.4

## 2014-12-11 MED ORDER — BUPROPION HCL ER (SR) 150 MG PO TB12: 150.0000 mg | ORAL_TABLET | Freq: Two times a day (BID) | ORAL | Status: DC

## 2014-12-11 NOTE — Progress Notes (Signed)
Name: Willie Hess   MRN: [id number]    DOB: Jul 06, 1965   Date:12/11/2014       Progress Note  Subjective  Chief Complaint  Chief Complaint  Patient presents with  . Establish Care    HPI   Willie Hess is a 49 y.o. male here today to transition care of medical needs to a primary care provider. He reports a recent history of kidney stone and was seen in the local ER otherwise he has some acid reflux. No complaints or concerns expressed today. He is a heavy cigarette smoker, about 1ppd most days but sometimes more. Started age 13 yo. Has tried to quit cold [place] on his own several times but he gets quite moody. Has a strong family hx of cardiac issues so had comprehensive cardiovascular testing done in 2014 which was all normal.    Past Medical History  Diagnosis Date  . Kidney stone   . GERD (gastroesophageal reflux disease)   . Headache     Patient Active Problem List   Diagnosis Date Noted  . GERD without esophagitis 12/11/2014  . History of kidney stones 12/11/2014    Social History  Substance Use Topics  . Smoking status: Current Every Day Smoker -- 1.00 packs/day for 30 years    Types: Cigarettes  . Smokeless tobacco: Never Used  . Alcohol Use: No     Current outpatient prescriptions:  .  Omeprazole-Sodium Bicarbonate (ZEGERID) 20-1100 MG CAPS capsule, Take 1 capsule by mouth 2 (two) times daily., Disp: , Rfl:   Past Surgical History  Procedure Laterality Date  . Esophagogastroduodenoscopy    . Wisdom tooth extraction    . Ureteroscopy with holmium laser lithotripsy Left 09/23/2014    Procedure: URETEROSCOPY, CYSTOSCOPY WITH STENT PLACEMENT, HOLMIUM STAND BY ;  Surgeon: Royston Cowper, MD;  Location: [facility] ORS;  Service: Urology;  Laterality: Left;    No family history on file.  Allergies  Allergen Reactions  . Bee Venom Swelling     Review of Systems  CONSTITUTIONAL: No significant weight changes, fever, chills, weakness or fatigue.  HEENT:  -  Eyes: No visual changes.  - Ears: No auditory changes. No pain.  - Nose: No sneezing, congestion, runny nose. - Throat: No sore throat. No changes in swallowing. SKIN: No rash or itching.  CARDIOVASCULAR: No chest pain, chest pressure or chest discomfort. No palpitations or edema.  RESPIRATORY: No shortness of breath, cough or sputum.  GASTROINTESTINAL: No anorexia, nausea, vomiting. No changes in bowel habits. No abdominal pain or blood.  GENITOURINARY: No dysuria. No frequency. No discharge.  NEUROLOGICAL: No headache, dizziness, syncope, paralysis, ataxia, numbness or tingling in the extremities. No memory changes. No change in bowel or bladder control.  MUSCULOSKELETAL: No joint pain. No muscle pain. HEMATOLOGIC: No anemia, bleeding or bruising.  LYMPHATICS: No enlarged lymph nodes.  PSYCHIATRIC: No change in mood. No change in sleep pattern.  ENDOCRINOLOGIC: No reports of sweating, cold or heat intolerance. No polyuria or polydipsia.     Objective  BP 130/76 mmHg  Pulse 85  Temp(Src) 98.3 F (36.8 C) (Oral)  Resp 16  Ht _0  (1.88 m)  Wt 186 lb 9.6 oz (84.641 kg)  BMI 23.95 kg/m2  SpO2 97% Body mass index is 23.95 kg/(m^2).  Physical Exam  Constitutional: Patient appears well-developed and well-nourished. In no distress.  HEENT:  - Head: Normocephalic and atraumatic.  - Ears: Bilateral TMs gray, no erythema or effusion - Nose: Nasal mucosa moist -  Mouth/Throat: Oropharynx is clear and moist. No tonsillar hypertrophy or erythema. No post nasal drainage.  - Eyes: Conjunctivae clear, EOM movements normal. PERRLA. No scleral icterus.  Neck: Normal range of motion. Neck supple. No JVD present. No thyromegaly present.  Cardiovascular: Normal rate, regular rhythm and normal heart sounds.  No murmur heard.  Pulmonary/Chest: Effort normal and breath sounds normal. No respiratory distress. Musculoskeletal: Normal range of motion bilateral UE and LE, no joint  effusions. Peripheral vascular: Bilateral LE no edema. Neurological: CN II-XII grossly intact with no focal deficits. Alert and oriented to person, place, and time. Coordination, balance, strength, speech and gait are normal.  Skin: Skin is warm and dry. No rash noted. No erythema.  Psychiatric: Patient has a normal mood and affect. Behavior is normal in office today. Judgment and thought content normal in office today.   Recent Results (from the past 2160 hour(s))  Basic metabolic panel     Status: Abnormal   Collection Time: 09/14/14  9:49 PM  Result Value Ref Range   Sodium 139 135 - 145 mmol/L   Potassium 4.1 3.5 - 5.1 mmol/L   Chloride 102 101 - 111 mmol/L   CO2 26 22 - 32 mmol/L   Glucose, Bld 146 (H) 65 - 99 mg/dL   BUN 13 6 - 20 mg/dL   Creatinine, Ser 1.31 (H) 0.61 - 1.24 mg/dL   Calcium 9.4 8.9 - 10.3 mg/dL   GFR calc non Af Amer >60 >60 mL/min   GFR calc Af Amer >60 >60 mL/min    Comment: (NOTE) The eGFR has been calculated using the CKD EPI equation. This calculation has not been validated in all clinical situations. eGFR's persistently <60 mL/min signify possible Chronic Kidney Disease.    Anion gap 11 5 - 15  CBC     Status: Abnormal   Collection Time: 09/14/14  9:49 PM  Result Value Ref Range   WBC 18.3 (H) 3.8 - 10.6 K/uL   RBC 5.48 4.40 - 5.90 MIL/uL   Hemoglobin 16.5 13.0 - 18.0 g/dL   HCT 48.6 40.0 - 52.0 %   MCV 88.7 80.0 - 100.0 fL   MCH 30.1 26.0 - 34.0 pg   MCHC 33.9 32.0 - 36.0 g/dL   RDW 13.8 11.5 - 14.5 %   Platelets 255 150 - 440 K/uL  Urinalysis complete, with microscopic- may I&O cath if menses [facility] only)     Status: Abnormal   Collection Time: 09/14/14  9:49 PM  Result Value Ref Range   Color, Urine YELLOW (A) YELLOW   APPearance CLEAR (A) CLEAR   Glucose, UA NEGATIVE NEGATIVE mg/dL   Bilirubin Urine NEGATIVE NEGATIVE   Ketones, ur 1+ (A) NEGATIVE mg/dL   Specific Gravity, Urine 1.026 1.005 - 1.030   Hgb urine dipstick NEGATIVE NEGATIVE    pH 7.0 5.0 - 8.0   Protein, ur 30 (A) NEGATIVE mg/dL   Nitrite NEGATIVE NEGATIVE   Leukocytes, UA TRACE (A) NEGATIVE   RBC / HPF 0-5 0 - 5 RBC/hpf   WBC, UA 0-5 0 - 5 WBC/hpf   Bacteria, UA NONE SEEN NONE SEEN   Squamous Epithelial / LPF 0-5 (A) NONE SEEN   Mucous PRESENT   Lipase, blood     Status: None   Collection Time: 09/14/14  9:49 PM  Result Value Ref Range   Lipase 39 22 - 51 U/L  CBC     Status: None   Collection Time: 09/16/14  1:37 PM  Result  Value Ref Range   WBC 10.1 3.8 - 10.6 K/uL   RBC 4.93 4.40 - 5.90 MIL/uL   Hemoglobin 14.9 13.0 - 18.0 g/dL   HCT 44.0 40.0 - 52.0 %   MCV 89.3 80.0 - 100.0 fL   MCH 30.2 26.0 - 34.0 pg   MCHC 33.8 32.0 - 36.0 g/dL   RDW 13.8 11.5 - 14.5 %   Platelets 219 150 - 440 K/uL  Basic metabolic panel     Status: Abnormal   Collection Time: 09/16/14  1:37 PM  Result Value Ref Range   Sodium 141 135 - 145 mmol/L   Potassium 3.6 3.5 - 5.1 mmol/L   Chloride 107 101 - 111 mmol/L   CO2 27 22 - 32 mmol/L   Glucose, Bld 132 (H) 65 - 99 mg/dL   BUN 13 6 - 20 mg/dL   Creatinine, Ser 1.60 (H) 0.61 - 1.24 mg/dL   Calcium 8.9 8.9 - 10.3 mg/dL   GFR calc non Af Amer 49 (L) >60 mL/min   GFR calc Af Amer 57 (L) >60 mL/min    Comment: (NOTE) The eGFR has been calculated using the CKD EPI equation. This calculation has not been validated in all clinical situations. eGFR's persistently <60 mL/min signify possible Chronic Kidney Disease.    Anion gap 7 5 - 15  Urinalysis complete, with microscopic- may I&O cath if menses [facility] only)     Status: Abnormal   Collection Time: 09/16/14  1:37 PM  Result Value Ref Range   Color, Urine YELLOW (A) YELLOW   APPearance CLEAR (A) CLEAR   Glucose, UA NEGATIVE NEGATIVE mg/dL   Bilirubin Urine NEGATIVE NEGATIVE   Ketones, ur TRACE (A) NEGATIVE mg/dL   Specific Gravity, Urine 1.027 1.005 - 1.030   Hgb urine dipstick NEGATIVE NEGATIVE   pH 5.0 5.0 - 8.0   Protein, ur NEGATIVE NEGATIVE mg/dL   Nitrite  NEGATIVE NEGATIVE   Leukocytes, UA NEGATIVE NEGATIVE   RBC / HPF 0-5 0 - 5 RBC/hpf   WBC, UA 0-5 0 - 5 WBC/hpf   Bacteria, UA NONE SEEN NONE SEEN   Squamous Epithelial / LPF 0-5 (A) NONE SEEN   Mucous PRESENT      Assessment & Plan  1. GERD without esophagitis Stable.  2. Decreased creatinine clearance Repeat CMP.  - Comprehensive metabolic panel  3. Heavy cigarette smoker The patient has been counseled on smoking cessation benefits, goals, strategies and available over the counter and prescription medications that may help them in their efforts.  Options discussed include Nicoderm patches, Wellbutrin and Chantix.  The patient voices understanding their increased risk of cardiovascular and pulmonary diseases with continued use of tobacco products.  - buPROPion (WELLBUTRIN SR) 150 MG 12 hr tablet; Take 1 tablet (150 mg total) by mouth 2 (two) times daily.  Dispense: 60 tablet; Refill: 1  4. Encounter for smoking cessation counseling  - buPROPion (WELLBUTRIN SR) 150 MG 12 hr tablet; Take 1 tablet (150 mg total) by mouth 2 (two) times daily.  Dispense: 60 tablet; Refill: 1  5. Need for immunization against influenza  - Flu Vaccine QUAD 36+ mos PF IM (Fluarix & Fluzone Quad PF)  6. History of kidney stones Keep well hydrated.

## 2014-12-11 NOTE — Patient Instructions (Signed)
Smoking Cessation, Tips for Success If you are ready to quit smoking, congratulations! You have chosen to help yourself be healthier. Cigarettes bring nicotine, tar, carbon monoxide, and other irritants into your body. Your lungs, heart, and blood vessels will be able to work better without these poisons. There are many different ways to quit smoking. Nicotine gum, nicotine patches, a nicotine inhaler, or nicotine nasal spray can help with physical craving. Hypnosis, support groups, and medicines help break the habit of smoking. WHAT THINGS CAN I DO TO MAKE QUITTING EASIER?  Here are some tips to help you quit for good:  Pick a date when you will quit smoking completely. Tell all of your friends and family about your plan to quit on that date.  Do not try to slowly cut down on the number of cigarettes you are smoking. Pick a quit date and quit smoking completely starting on that day.  Throw away all cigarettes.   Clean and remove all ashtrays from your home, work, and car.  On a card, write down your reasons for quitting. Carry the card with you and read it when you get the urge to smoke.  Cleanse your body of nicotine. Drink enough water and fluids to keep your urine clear or pale yellow. Do this after quitting to flush the nicotine from your body.  Learn to predict your moods. Do not let a bad situation be your excuse to have a cigarette. Some situations in your life might tempt you into wanting a cigarette.  Never have "just one" cigarette. It leads to wanting another and another. Remind yourself of your decision to quit.  Change habits associated with smoking. If you smoked while driving or when feeling stressed, try other activities to replace smoking. Stand up when drinking your coffee. Brush your teeth after eating. Sit in a different chair when you read the paper. Avoid alcohol while trying to quit, and try to drink fewer caffeinated beverages. Alcohol and caffeine may urge you to  smoke.  Avoid foods and drinks that can trigger a desire to smoke, such as sugary or spicy foods and alcohol.  Ask people who smoke not to smoke around you.  Have something planned to do right after eating or having a cup of coffee. For example, plan to take a walk or exercise.  Try a relaxation exercise to calm you down and decrease your stress. Remember, you may be tense and nervous for the first 2 weeks after you quit, but this will pass.  Find new activities to keep your hands busy. Play with a pen, coin, or rubber band. Doodle or draw things on paper.  Brush your teeth right after eating. This will help cut down on the craving for the taste of tobacco after meals. You can also try mouthwash.   Use oral substitutes in place of cigarettes. Try using lemon drops, carrots, cinnamon sticks, or chewing gum. Keep them handy so they are available when you have the urge to smoke.  When you have the urge to smoke, try deep breathing.  Designate your home as a nonsmoking area.  If you are a heavy smoker, ask your health care provider about a prescription for nicotine chewing gum. It can ease your withdrawal from nicotine.  Reward yourself. Set aside the cigarette money you save and buy yourself something nice.  Look for support from others. Join a support group or smoking cessation program. Ask someone at home or at work to help you with your plan   to quit smoking.  Always ask yourself, "Do I need this cigarette or is this just a reflex?" Tell yourself, "Today, I choose not to smoke," or "I do not want to smoke." You are reminding yourself of your decision to quit.  Do not replace cigarette smoking with electronic cigarettes (commonly called e-cigarettes). The safety of e-cigarettes is unknown, and some may contain harmful chemicals.  If you relapse, do not give up! Plan ahead and think about what you will do the next time you get the urge to smoke. HOW WILL I FEEL WHEN I QUIT SMOKING? You  may have symptoms of withdrawal because your body is used to nicotine (the addictive substance in cigarettes). You may crave cigarettes, be irritable, feel very hungry, cough often, get headaches, or have difficulty concentrating. The withdrawal symptoms are only temporary. They are strongest when you first quit but will go away within 10-14 days. When withdrawal symptoms occur, stay in control. Think about your reasons for quitting. Remind yourself that these are signs that your body is healing and getting used to being without cigarettes. Remember that withdrawal symptoms are easier to treat than the major diseases that smoking can cause.  Even after the withdrawal is over, expect periodic urges to smoke. However, these cravings are generally short lived and will go away whether you smoke or not. Do not smoke! WHAT RESOURCES ARE AVAILABLE TO HELP ME QUIT SMOKING? Your health care provider can direct you to community resources or hospitals for support, which may include:  Group support.  Education.  Hypnosis.  Therapy.   This information is not intended to replace advice given to you by your health care provider. Make sure you discuss any questions you have with your health care provider.   Document Released: 10/30/2003 Document Revised: 02/21/2014 Document Reviewed: 07/19/2012 Elsevier Interactive Patient Education 2016 [employer].  

## 2015-02-14 ENCOUNTER — Other Ambulatory Visit: Payer: Self-pay | Admitting: Family Medicine

## 2015-10-08 ENCOUNTER — Ambulatory Visit: Payer: 59 | Admitting: Family Medicine

## 2015-10-12 ENCOUNTER — Encounter: Payer: Self-pay | Admitting: Family Medicine

## 2015-10-12 ENCOUNTER — Other Ambulatory Visit: Payer: Self-pay

## 2015-10-12 ENCOUNTER — Ambulatory Visit (INDEPENDENT_AMBULATORY_CARE_PROVIDER_SITE_OTHER): Payer: 59 | Admitting: Family Medicine

## 2015-10-12 DIAGNOSIS — Z0000 Encounter for general adult medical examination without abnormal findings: Secondary | ICD-10-CM | POA: Insufficient documentation

## 2015-10-12 DIAGNOSIS — Z1211 Encounter for screening for malignant neoplasm of colon: Secondary | ICD-10-CM | POA: Diagnosis not present

## 2015-10-12 DIAGNOSIS — R944 Abnormal results of kidney function studies: Secondary | ICD-10-CM

## 2015-10-12 DIAGNOSIS — IMO0001 Reserved for inherently not codable concepts without codable children: Secondary | ICD-10-CM

## 2015-10-12 DIAGNOSIS — Z720 Tobacco use: Secondary | ICD-10-CM | POA: Diagnosis not present

## 2015-10-12 DIAGNOSIS — K219 Gastro-esophageal reflux disease without esophagitis: Secondary | ICD-10-CM

## 2015-10-12 HISTORY — DX: Encounter for general adult medical examination without abnormal findings: Z00.00

## 2015-10-12 MED ORDER — VARENICLINE TARTRATE 1 MG PO TABS: 1.0000 mg | ORAL_TABLET | Freq: Two times a day (BID) | ORAL | 3 refills | Status: DC

## 2015-10-12 MED ORDER — VARENICLINE TARTRATE 0.5 MG X 11 & 1 MG X 42 PO MISC: ORAL | 0 refills | Status: DC

## 2015-10-12 NOTE — Assessment & Plan Note (Signed)
Refer to have colonoscopy done

## 2015-10-12 NOTE — Assessment & Plan Note (Addendum)
81 mg coated aspirin recommended for heart protection, and patient says he'll be able to tolerate this just fine; avoid triggers if possible; use of PPI chronically has risks

## 2015-10-12 NOTE — Patient Instructions (Addendum)
I do encourage you to quit smoking Call [phone] to sign up for smoking cessation classes You can call 1-800-QUIT-NOW to talk with a smoking cessation coach  Caution: prolonged use of proton pump inhibitors like omeprazole (Prilosec), pantoprazole (Protonix), esomeprazole (Nexium), and others like Dexilant and Aciphex may increase your risk of pneumonia, Clostridium difficile colitis, osteoporosis, anemia and other health complications Try to limit or avoid triggers like coffee, caffeinated beverages, onions, chocolate, spicy foods, peppermint, acid foods like pizza, spaghetti sauce, and orange juice Lose weight if you are overweight or obese Try elevating the head of your bed by placing a small wedge between your mattress and box springs to keep acid in the stomach at night instead of coming up into your esophagus  Smoking Cessation, Tips for Success If you are ready to quit smoking, congratulations! You have chosen to help yourself be healthier. Cigarettes bring nicotine, tar, carbon monoxide, and other irritants into your body. Your lungs, heart, and blood vessels will be able to work better without these poisons. There are many different ways to quit smoking. Nicotine gum, nicotine patches, a nicotine inhaler, or nicotine nasal spray can help with physical craving. Hypnosis, support groups, and medicines help break the habit of smoking. WHAT THINGS CAN I DO TO MAKE QUITTING EASIER?  Here are some tips to help you quit for good:  Pick a date when you will quit smoking completely. Tell all of your friends and family about your plan to quit on that date.  Do not try to slowly cut down on the number of cigarettes you are smoking. Pick a quit date and quit smoking completely starting on that day.  Throw away all cigarettes.   Clean and remove all ashtrays from your home, work, and car.  On a card, write down your reasons for quitting. Carry the card with you and read it when you get the  urge to smoke.  Cleanse your body of nicotine. Drink enough water and fluids to keep your urine clear or pale yellow. Do this after quitting to flush the nicotine from your body.  Learn to predict your moods. Do not let a bad situation be your excuse to have a cigarette. Some situations in your life might tempt you into wanting a cigarette.  Never have "just one" cigarette. It leads to wanting another and another. Remind yourself of your decision to quit.  Change habits associated with smoking. If you smoked while driving or when feeling stressed, try other activities to replace smoking. Stand up when drinking your coffee. Brush your teeth after eating. Sit in a different chair when you read the paper. Avoid alcohol while trying to quit, and try to drink fewer caffeinated beverages. Alcohol and caffeine may urge you to smoke.  Avoid foods and drinks that can trigger a desire to smoke, such as sugary or spicy foods and alcohol.  Ask people who smoke not to smoke around you.  Have something planned to do right after eating or having a cup of coffee. For example, plan to take a walk or exercise.  Try a relaxation exercise to calm you down and decrease your stress. Remember, you may be tense and nervous for the first 2 weeks after you quit, but this will pass.  Find new activities to keep your hands busy. Play with a pen, coin, or rubber band. Doodle or draw things on paper.  Brush your teeth right after eating. This will help cut down on the craving for the taste  of tobacco after meals. You can also try mouthwash.   Use oral substitutes in place of cigarettes. Try using lemon drops, carrots, cinnamon sticks, or chewing gum. Keep them handy so they are available when you have the urge to smoke.  When you have the urge to smoke, try deep breathing.  Designate your home as a nonsmoking area.  If you are a heavy smoker, ask your health care provider about a prescription for nicotine chewing  gum. It can ease your withdrawal from nicotine.  Reward yourself. Set aside the cigarette money you save and buy yourself something nice.  Look for support from others. Join a support group or smoking cessation program. Ask someone at home or at work to help you with your plan to quit smoking.  Always ask yourself, "Do I need this cigarette or is this just a reflex?" Tell yourself, "Today, I choose not to smoke," or "I do not want to smoke." You are reminding yourself of your decision to quit.  Do not replace cigarette smoking with electronic cigarettes (commonly called e-cigarettes). The safety of e-cigarettes is unknown, and some may contain harmful chemicals.  If you relapse, do not give up! Plan ahead and think about what you will do the next time you get the urge to smoke. HOW WILL I FEEL WHEN I QUIT SMOKING? You may have symptoms of withdrawal because your body is used to nicotine (the addictive substance in cigarettes). You may crave cigarettes, be irritable, feel very hungry, cough often, get headaches, or have difficulty concentrating. The withdrawal symptoms are only temporary. They are strongest when you first quit but will go away within 10-14 days. When withdrawal symptoms occur, stay in control. Think about your reasons for quitting. Remind yourself that these are signs that your body is healing and getting used to being without cigarettes. Remember that withdrawal symptoms are easier to treat than the major diseases that smoking can cause.  Even after the withdrawal is over, expect periodic urges to smoke. However, these cravings are generally short lived and will go away whether you smoke or not. Do not smoke! WHAT RESOURCES ARE AVAILABLE TO HELP ME QUIT SMOKING? Your health care provider can direct you to community resources or hospitals for support, which may include:  Group support.  Education.  Hypnosis.  Therapy.   This information is not intended to replace advice given  to you by your health care provider. Make sure you discuss any questions you have with your health care provider.   Document Released: 10/30/2003 Document Revised: 02/21/2014 Document Reviewed: 07/19/2012 Elsevier Interactive Patient Education [employer].

## 2015-10-12 NOTE — Assessment & Plan Note (Addendum)
Screening form completed; labs pending

## 2015-10-12 NOTE — Progress Notes (Signed)
BP 114/72   Pulse 98   Temp 98.5 F (36.9 C) (Oral)   Resp 16   Ht 6' (1.829 m)   Wt 186 lb 6.4 oz (84.6 kg)   SpO2 98%   BMI 25.28 kg/m    Subjective:    Patient ID: Willie Hess, male    DOB: 1965-06-23, 50 y.o.   MRN: [id number]  HPI: Willie Hess is a 50 y.o. male  Chief Complaint  Patient presents with  . Form    [facility]   Patient is new to me; here to get a wellness screen form filled out for his employer; needs fasting labs and needs to quit smoking  Patient smokes about a 1 ppd He has tried to quit a few times; he tried cold [place], tried backing off May have tried wellbutrin to help  He is not trying anything right now He has not tried Chantix  He used to watch the commercials and worry about the side effects Started smoking at age 62 years old No smokers in the home, did grow up with smokers Allowed to smoke at work, could just step outside First cig of the day within 30 minutes  Last cig of the evening just before Not smoking in the middle of the night  Has had acid reflux since high school Started on zantac and that really helped Symptoms well-controlled with medicine; no blood in the stool  Depression screen [facility] 2/9 10/12/2015 12/11/2014  Decreased Interest 0 0  Down, Depressed, Hopeless 0 0  PHQ - 2 Score 0 0   Relevant past medical, surgical, family and social history reviewed Past Medical History:  Diagnosis Date  . GERD (gastroesophageal reflux disease)   . Headache   . Kidney stone    Past Surgical History:  Procedure Laterality Date  . ESOPHAGOGASTRODUODENOSCOPY    . URETEROSCOPY WITH HOLMIUM LASER LITHOTRIPSY Left 09/23/2014   Procedure: URETEROSCOPY, CYSTOSCOPY WITH STENT PLACEMENT, HOLMIUM STAND BY ;  Surgeon: Royston Cowper, MD;  Location: [facility] ORS;  Service: Urology;  Laterality: Left;  . WISDOM TOOTH EXTRACTION     No family history on file. Social History  Substance Use Topics  . Smoking status: Current Every Day Smoker   Packs/day: 1.00    Years: 30.00    Types: Cigarettes  . Smokeless tobacco: Never Used  . Alcohol use No   Interim medical history since last visit reviewed. Allergies and medications reviewed  Review of Systems Per HPI unless specifically indicated above     Objective:    BP 114/72   Pulse 98   Temp 98.5 F (36.9 C) (Oral)   Resp 16   Ht 6' (1.829 m)   Wt 186 lb 6.4 oz (84.6 kg)   SpO2 98%   BMI 25.28 kg/m   Wt Readings from Last 3 Encounters:  10/12/15 186 lb 6.4 oz (84.6 kg)  12/11/14 186 lb 9.6 oz (84.6 kg)  09/23/14 185 lb (83.9 kg)    Physical Exam  Constitutional: He appears well-developed and well-nourished. No distress.  HENT:  Head: Normocephalic and atraumatic.  Eyes: EOM are normal. No scleral icterus.  Neck: No JVD present.  Cardiovascular: Normal rate and regular rhythm.   Pulmonary/Chest: Effort normal and breath sounds normal.  Abdominal: Soft. He exhibits no distension.  Musculoskeletal: He exhibits no edema.  Neurological: He is alert.  Skin: Skin is warm and dry. No pallor.  Psychiatric: He has a normal mood and affect. His behavior is  normal. Judgment and thought content normal.   Results for orders placed or performed during the hospital encounter of 09/16/14  CBC  Result Value Ref Range   WBC 10.1 3.8 - 10.6 K/uL   RBC 4.93 4.40 - 5.90 MIL/uL   Hemoglobin 14.9 13.0 - 18.0 g/dL   HCT 44.0 40.0 - 52.0 %   MCV 89.3 80.0 - 100.0 fL   MCH 30.2 26.0 - 34.0 pg   MCHC 33.8 32.0 - 36.0 g/dL   RDW 13.8 11.5 - 14.5 %   Platelets 219 150 - 440 K/uL  Basic metabolic panel  Result Value Ref Range   Sodium 141 135 - 145 mmol/L   Potassium 3.6 3.5 - 5.1 mmol/L   Chloride 107 101 - 111 mmol/L   CO2 27 22 - 32 mmol/L   Glucose, Bld 132 (H) 65 - 99 mg/dL   BUN 13 6 - 20 mg/dL   Creatinine, Ser 1.60 (H) 0.61 - 1.24 mg/dL   Calcium 8.9 8.9 - 10.3 mg/dL   GFR calc non Af Amer 49 (L) >60 mL/min   GFR calc Af Amer 57 (L) >60 mL/min   Anion gap 7 5 - 15   Urinalysis complete, with microscopic- may I&O cath if menses ([facility] only)  Result Value Ref Range   Color, Urine YELLOW (A) YELLOW   APPearance CLEAR (A) CLEAR   Glucose, UA NEGATIVE NEGATIVE mg/dL   Bilirubin Urine NEGATIVE NEGATIVE   Ketones, ur TRACE (A) NEGATIVE mg/dL   Specific Gravity, Urine 1.027 1.005 - 1.030   Hgb urine dipstick NEGATIVE NEGATIVE   pH 5.0 5.0 - 8.0   Protein, ur NEGATIVE NEGATIVE mg/dL   Nitrite NEGATIVE NEGATIVE   Leukocytes, UA NEGATIVE NEGATIVE   RBC / HPF 0-5 0 - 5 RBC/hpf   WBC, UA 0-5 0 - 5 WBC/hpf   Bacteria, UA NONE SEEN NONE SEEN   Squamous Epithelial / LPF 0-5 (A) NONE SEEN   Mucous PRESENT       Assessment & Plan:   Problem List Items Addressed This Visit      Digestive   GERD without esophagitis    81 mg coated aspirin recommended for heart protection, and patient says he'll be able to tolerate this just fine; avoid triggers if possible; use of PPI chronically has risks        Other   Tobacco abuse    Discussed different forms of smoking cessation with the patient, and I encouraged cessation. Patient requests trial of Chantix.  I discussed common side effects including but not limited to nausea/vomiting, vivid dreams, behaviour changes, depression, suicidal ideation; also explained possible increased cardiovascular risk. After discussion, patient opts to try quitting with Chantix. The 1-800-QUIT-NOW number given in patient instructions. Patient was encouraged to choose a quit date about 1 week after starting medicine.      Relevant Medications   varenicline (CHANTIX STARTING MONTH PAK) 0.5 MG X 11 & 1 MG X 42 tablet   varenicline (CHANTIX CONTINUING MONTH PAK) 1 MG tablet   Preventative health care    Screening form completed; labs pending      Relevant Orders   Glucose, Random   Decreased creatinine clearance    Noted on previous labs from last year; will be getting creatinine as part of today's labs      Colon cancer screening      Refer to have colonoscopy done      Relevant Orders   Ambulatory referral to Gastroenterology  Other Visit Diagnoses   None.      Follow up plan: Return in about 1 year (around 10/11/2016) for complete physical.  An after-visit summary was printed and given to the patient at [place].  Please see the patient instructions which may contain other information and recommendations beyond what is mentioned above in the assessment and plan.  Meds ordered this encounter  Medications  . varenicline (CHANTIX STARTING MONTH PAK) 0.5 MG X 11 & 1 MG X 42 tablet    Sig: Take one 0.5 mg tablet by mouth once daily for 3 days, then increase to one 0.5 mg tablet twice daily for 4 days, then increase to one 1 mg tablet twice daily.    Dispense:  53 tablet    Refill:  0  . varenicline (CHANTIX CONTINUING MONTH PAK) 1 MG tablet    Sig: Take 1 tablet (1 mg total) by mouth 2 (two) times daily.    Dispense:  60 tablet    Refill:  3    Orders Placed This Encounter  Procedures  . Glucose, Random  . Ambulatory referral to Gastroenterology

## 2015-10-12 NOTE — Assessment & Plan Note (Signed)
Discussed different forms of smoking cessation with the patient, and I encouraged cessation. Patient requests trial of Chantix.  I discussed common side effects including but not limited to nausea/vomiting, vivid dreams, behaviour changes, depression, suicidal ideation; also explained possible increased cardiovascular risk. After discussion, patient opts to try quitting with Chantix. The 1-800-QUIT-NOW number given in patient instructions. Patient was encouraged to choose a quit date about 1 week after starting medicine. 

## 2015-10-12 NOTE — Assessment & Plan Note (Signed)
Noted on previous labs from last year; will be getting creatinine as part of today's labs

## 2015-10-13 ENCOUNTER — Telehealth: Payer: Self-pay

## 2015-10-13 ENCOUNTER — Other Ambulatory Visit: Payer: Self-pay

## 2015-10-13 LAB — LP+CREAT+HB A1C
CHOLESTEROL TOTAL: 198 mg/dL (ref 100–199)
CREATININE: 1.08 mg/dL (ref 0.76–1.27)
Chol/HDL Ratio: 3.9 ratio units (ref 0.0–5.0)
GFR calc non Af Amer: 80 mL/min/{1.73_m2} (ref >59)
GFR, EST AFRICAN AMERICAN: 92 mL/min/{1.73_m2} (ref >59)
HDL: 51 mg/dL (ref >39)
Hgb A1c MFr Bld: 5.6 % (ref 4.8–5.6)
LDL CALC: 131 mg/dL — AB (ref 0–99)
TRIGLYCERIDES: 81 mg/dL (ref 0–149)
VLDL Cholesterol Cal: 16 mg/dL (ref 5–40)

## 2015-10-13 LAB — GLUCOSE, RANDOM: Glucose: 82 mg/dL (ref 65–99)

## 2015-10-13 NOTE — Telephone Encounter (Signed)
Screening Colonoscopy Z12.11 [facility] 0000000 Please pre cert

## 2015-10-13 NOTE — Telephone Encounter (Signed)
Gastroenterology Pre-Procedure Review  Request Date: 11/12/2015 Requesting Physician: Dr. Sanda Klein  PATIENT REVIEW QUESTIONS: The patient responded to the following health history questions as indicated:    1. Are you having any GI issues? no 2. Do you have a personal history of Polyps? no 3. Do you have a family history of Colon Cancer or Polyps? no 4. Diabetes Mellitus? no 5. Joint replacements in the past 12 months?no 6. Major health problems in the past 3 months?no 7. Any artificial heart valves, MVP, or defibrillator?no    MEDICATIONS & ALLERGIES:    Patient reports the following regarding taking any anticoagulation/antiplatelet therapy:   Plavix, Coumadin, Eliquis, Xarelto, Lovenox, Pradaxa, Brilinta, or Effient? no Aspirin? no  Patient confirms/reports the following medications:  Current Outpatient Prescriptions  Medication Sig Dispense Refill  . Omeprazole-Sodium Bicarbonate (ZEGERID) 20-1100 MG CAPS capsule Take 1 capsule by mouth 2 (two) times daily.    . varenicline (CHANTIX STARTING MONTH PAK) 0.5 MG X 11 & 1 MG X 42 tablet Take one 0.5 mg tablet by mouth once daily for 3 days, then increase to one 0.5 mg tablet twice daily for 4 days, then increase to one 1 mg tablet twice daily. 53 tablet 0   No current facility-administered medications for this visit.     Patient confirms/reports the following allergies:  Allergies  Allergen Reactions  . Bee Venom Swelling    No orders of the defined types were placed in this encounter.   AUTHORIZATION INFORMATION Primary Insurance: 1D#: Group #:  Secondary Insurance: 1D#: Group #:  SCHEDULE INFORMATION: Date: 11/12/2015 Time: Location: [facility]

## 2015-10-27 ENCOUNTER — Encounter: Payer: Self-pay | Admitting: Family Medicine

## 2015-10-27 ENCOUNTER — Ambulatory Visit (INDEPENDENT_AMBULATORY_CARE_PROVIDER_SITE_OTHER): Payer: 59 | Admitting: Family Medicine

## 2015-10-27 VITALS — BP 110/74 | HR 88 | Temp 97.9°F | Wt 187.8 lb

## 2015-10-27 DIAGNOSIS — Z23 Encounter for immunization: Secondary | ICD-10-CM | POA: Diagnosis not present

## 2015-10-27 NOTE — Progress Notes (Signed)
Brought in the tobacco appeal form we already discussed at last visit; form completed CMA gave flu shot

## 2015-11-05 ENCOUNTER — Encounter: Payer: Self-pay | Admitting: *Deleted

## 2015-11-10 NOTE — Discharge Instructions (Signed)

## 2015-11-12 ENCOUNTER — Ambulatory Visit: Payer: 59 | Admitting: Anesthesiology

## 2015-11-12 ENCOUNTER — Encounter
Admission: RE | Disposition: A | Discharge status: Home / Self Care | Payer: Self-pay | Source: Ambulatory Visit | Attending: Gastroenterology

## 2015-11-12 ENCOUNTER — Ambulatory Visit
Admission: RE | Admit: 2015-11-12 | Discharge: 2015-11-12 | Disposition: A | Discharge status: Home / Self Care | Payer: 59 | Source: Ambulatory Visit | Attending: Gastroenterology | Admitting: Gastroenterology

## 2015-11-12 DIAGNOSIS — K641 Second degree hemorrhoids: Secondary | ICD-10-CM | POA: Diagnosis not present

## 2015-11-12 DIAGNOSIS — D125 Benign neoplasm of sigmoid colon: Secondary | ICD-10-CM | POA: Diagnosis not present

## 2015-11-12 DIAGNOSIS — Z87442 Personal history of urinary calculi: Secondary | ICD-10-CM | POA: Insufficient documentation

## 2015-11-12 DIAGNOSIS — K219 Gastro-esophageal reflux disease without esophagitis: Secondary | ICD-10-CM | POA: Insufficient documentation

## 2015-11-12 DIAGNOSIS — D120 Benign neoplasm of cecum: Secondary | ICD-10-CM | POA: Diagnosis not present

## 2015-11-12 DIAGNOSIS — D123 Benign neoplasm of transverse colon: Secondary | ICD-10-CM | POA: Diagnosis not present

## 2015-11-12 DIAGNOSIS — Z1211 Encounter for screening for malignant neoplasm of colon: Secondary | ICD-10-CM | POA: Insufficient documentation

## 2015-11-12 DIAGNOSIS — F17210 Nicotine dependence, cigarettes, uncomplicated: Secondary | ICD-10-CM | POA: Insufficient documentation

## 2015-11-12 DIAGNOSIS — D122 Benign neoplasm of ascending colon: Secondary | ICD-10-CM | POA: Diagnosis not present

## 2015-11-12 DIAGNOSIS — Z91030 Bee allergy status: Secondary | ICD-10-CM | POA: Insufficient documentation

## 2015-11-12 HISTORY — PX: COLONOSCOPY WITH PROPOFOL: SHX5780

## 2015-11-12 HISTORY — PX: POLYPECTOMY: SHX5525

## 2015-11-12 SURGERY — COLONOSCOPY WITH PROPOFOL
Anesthesia: Monitor Anesthesia Care | Wound class: Contaminated

## 2015-11-12 MED ORDER — LACTATED RINGERS IV SOLN
INTRAVENOUS | Status: DC
  Administered 2015-11-12 (×2): via INTRAVENOUS

## 2015-11-12 MED ORDER — PROPOFOL 10 MG/ML IV BOLUS
INTRAVENOUS | Status: DC | PRN
  Administered 2015-11-12: 30 mg via INTRAVENOUS
  Administered 2015-11-12: 70 mg via INTRAVENOUS
  Administered 2015-11-12: 20 mg via INTRAVENOUS
  Administered 2015-11-12: 30 mg via INTRAVENOUS
  Administered 2015-11-12: 20 mg via INTRAVENOUS
  Administered 2015-11-12: 30 mg via INTRAVENOUS
  Administered 2015-11-12: 20 mg via INTRAVENOUS
  Administered 2015-11-12: 30 mg via INTRAVENOUS

## 2015-11-12 MED ORDER — STERILE WATER FOR IRRIGATION IR SOLN
Status: DC | PRN
  Administered 2015-11-12: 11:00:00

## 2015-11-12 MED ORDER — LIDOCAINE HCL (CARDIAC) 20 MG/ML IV SOLN
INTRAVENOUS | Status: DC | PRN
  Administered 2015-11-12: 50 mg via INTRAVENOUS

## 2015-11-12 SURGICAL SUPPLY — 23 items

## 2015-11-12 NOTE — H&P (Signed)
  Willie Lame, MD [facility] [address] [place], [place] [address] Phone: [phone] Fax : [phone]  Primary Care Physician:  Willie Derry, MD Primary Gastroenterologist:  Dr. Allen Norris  Pre-Procedure History & Physical: HPI:  Willie Hess is a 50 y.o. male is here for a screening colonoscopy.   Past Medical History:  Diagnosis Date  . GERD (gastroesophageal reflux disease)   . Headache    migraines 1x/6 mos (lesser HAs more often)  . Kidney stone     Past Surgical History:  Procedure Laterality Date  . ESOPHAGOGASTRODUODENOSCOPY    . URETEROSCOPY WITH HOLMIUM LASER LITHOTRIPSY Left 09/23/2014   Procedure: URETEROSCOPY, CYSTOSCOPY WITH STENT PLACEMENT, HOLMIUM STAND BY ;  Surgeon: Royston Cowper, MD;  Location: [facility] ORS;  Service: Urology;  Laterality: Left;  . WISDOM TOOTH EXTRACTION      Prior to Admission medications   Medication Sig Start Date End Date Taking? Authorizing Provider  Omeprazole-Sodium Bicarbonate (ZEGERID) 20-1100 MG CAPS capsule Take 1 capsule by mouth 2 (two) times daily.   Yes Historical Provider, MD  varenicline (CHANTIX STARTING MONTH PAK) 0.5 MG X 11 & 1 MG X 42 tablet Take one 0.5 mg tablet by mouth once daily for 3 days, then increase to one 0.5 mg tablet twice daily for 4 days, then increase to one 1 mg tablet twice daily. 10/12/15   Arnetha Courser, MD    Allergies as of 10/13/2015 - Review Complete 10/13/2015  Allergen Reaction Noted  . Bee venom Swelling 09/15/2014    History reviewed. No pertinent family history.  Social History   Social History  . Marital status: Married    Spouse name: N/A  . Number of children: N/A  . Years of education: N/A   Occupational History  . Not on file.   Social History Main Topics  . Smoking status: Current Every Day Smoker    Packs/day: 1.00    Years: 30.00    Types: Cigarettes  . Smokeless tobacco: Never Used  . Alcohol use No  . Drug use: No  . Sexual activity: Not on file   Other  Topics Concern  . Not on file   Social History Narrative  . No narrative on file    Review of Systems: See HPI, otherwise negative ROS  Physical Exam: Pulse 70   Temp 97.5 F (36.4 C) (Temporal)   Resp 16   Ht 6' (1.829 m)   Wt 187 lb (84.8 kg)   SpO2 99%   BMI 25.36 kg/m  General:   Alert,  pleasant and cooperative in NAD Head:  Normocephalic and atraumatic. Neck:  Supple; no masses or thyromegaly. Lungs:  Clear throughout to auscultation.    Heart:  Regular rate and rhythm. Abdomen:  Soft, nontender and nondistended. Normal bowel sounds, without guarding, and without rebound.   Neurologic:  Alert and  oriented x4;  grossly normal neurologically.  Impression/Plan: Willie Hess is now here to undergo a screening colonoscopy.  Risks, benefits, and alternatives regarding colonoscopy have been reviewed with the patient.  Questions have been answered.  All parties agreeable.

## 2015-11-12 NOTE — Transfer of Care (Signed)
Immediate Anesthesia Transfer of Care Note  Patient: Willie Hess  Procedure(s) Performed: Procedure(s): COLONOSCOPY WITH PROPOFOL (N/A) POLYPECTOMY  Patient Location: PACU  Anesthesia Type: MAC  Level of Consciousness: awake, alert  and patient cooperative  Airway and Oxygen Therapy: Patient Spontanous Breathing and Patient connected to supplemental oxygen  Post-op Assessment: Post-op Vital signs reviewed, Patient's Cardiovascular Status Stable, Respiratory Function Stable, Patent Airway and No signs of Nausea or vomiting  Post-op Vital Signs: Reviewed and stable  Complications: No apparent anesthesia complications

## 2015-11-12 NOTE — Op Note (Signed)
[facility] Gastroenterology Patient Name: Willie Hess Procedure Date: 11/12/2015 10:53 AM MRN: [id number] Account #: [id number] Date of Birth: [birth year] Admit Type: Outpatient Age: 50 Room: [facility] OR ROOM 01 Gender: Male Note Status: Finalized Procedure:            Colonoscopy Indications:          Screening for colorectal malignant neoplasm Providers:            Lucilla Lame MD, MD Referring MD:         Arnetha Courser (Referring MD) Medicines:            Propofol per Anesthesia Complications:        No immediate complications. Procedure:            Pre-Anesthesia Assessment:                       - Prior to the procedure, a History and Physical was                        performed, and patient medications and allergies were                        reviewed. The patient's tolerance of previous                        anesthesia was also reviewed. The risks and benefits of                        the procedure and the sedation options and risks were                        discussed with the patient. All questions were                        answered, and informed consent was obtained. Prior                        Anticoagulants: The patient has taken no previous                        anticoagulant or antiplatelet agents. ASA Grade                        Assessment: II - A patient with mild systemic disease.                        After reviewing the risks and benefits, the patient was                        deemed in satisfactory condition to undergo the                        procedure.                       After obtaining informed consent, the colonoscope was                        passed under direct vision. Throughout the procedure,  the patient's blood pressure, pulse, and oxygen                        saturations were monitored continuously. The Olympus                        CF-HQ190L Colonoscope (S#. [id number]) was introduced              through the anus and advanced to the the cecum,                        identified by appendiceal orifice and ileocecal valve.                        The colonoscopy was performed without difficulty. The                        patient tolerated the procedure well. The quality of                        the bowel preparation was excellent. Findings:      The perianal and digital rectal examinations were normal.      A 3 mm polyp was found in the cecum. The polyp was sessile. The polyp       was removed with a cold biopsy forceps. Resection and retrieval were       complete.      A 4 mm polyp was found in the ascending colon. The polyp was sessile.       The polyp was removed with a cold biopsy forceps. Resection and       retrieval were complete.      A 4 mm polyp was found in the transverse colon. The polyp was sessile.       The polyp was removed with a cold biopsy forceps. Resection and       retrieval were complete.      A 4 mm polyp was found in the sigmoid colon. The polyp was sessile. The       polyp was removed with a cold biopsy forceps. Resection and retrieval       were complete.      Non-bleeding internal hemorrhoids were found during retroflexion. The       hemorrhoids were Grade II (internal hemorrhoids that prolapse but reduce       spontaneously). Impression:           - One 3 mm polyp in the cecum, removed with a cold                        biopsy forceps. Resected and retrieved.                       - One 4 mm polyp in the ascending colon, removed with a                        cold biopsy forceps. Resected and retrieved.                       - One 4 mm polyp in the transverse colon, removed with  a cold biopsy forceps. Resected and retrieved.                       - One 4 mm polyp in the sigmoid colon, removed with a                        cold biopsy forceps. Resected and retrieved.                       - Non-bleeding internal  hemorrhoids. Recommendation:       - Await pathology results.                       - Discharge patient to home.                       - Resume previous diet.                       - Continue present medications. Procedure Code(s):    --- Professional ---                       [phone], Colonoscopy, flexible; with biopsy, single or                        multiple Diagnosis Code(s):    --- Professional ---                       Z12.11, Encounter for screening for malignant neoplasm                        of colon                       D12.0, Benign neoplasm of cecum                       D12.2, Benign neoplasm of ascending colon                       D12.3, Benign neoplasm of transverse colon (hepatic                        flexure or splenic flexure)                       D12.5, Benign neoplasm of sigmoid colon CPT copyright 2016 American Medical Association. All rights reserved. The codes documented in this report are preliminary and upon coder review may  be revised to meet current compliance requirements. Lucilla Lame MD, MD 11/12/2015 11:13:36 AM This report has been signed electronically. Number of Addenda: 0 Note Initiated On: 11/12/2015 10:53 AM Scope Withdrawal Time: 0 hours 7 minutes 59 seconds  Total Procedure Duration: 0 hours 10 minutes 16 seconds       [facility]

## 2015-11-12 NOTE — Anesthesia Procedure Notes (Signed)
Procedure Name: MAC Performed by: Dimitri Dsouza Pre-anesthesia Checklist: Patient identified, Emergency Drugs available, Suction available, Timeout performed and Patient being monitored Patient Re-evaluated:Patient Re-evaluated prior to inductionOxygen Delivery Method: Nasal cannula Placement Confirmation: positive ETCO2       

## 2015-11-12 NOTE — Anesthesia Postprocedure Evaluation (Signed)
Anesthesia Post Note  Patient: Willie Hess  Procedure(s) Performed: Procedure(s) (LRB): COLONOSCOPY WITH PROPOFOL (N/A) POLYPECTOMY  Patient location during evaluation: PACU Anesthesia Type: MAC Level of consciousness: awake and alert Pain management: pain level controlled Vital Signs Assessment: post-procedure vital signs reviewed and stable Respiratory status: spontaneous breathing, nonlabored ventilation, respiratory function stable and patient connected to nasal cannula oxygen Cardiovascular status: stable and blood pressure returned to baseline Anesthetic complications: no    Jaslyn Bansal

## 2015-11-12 NOTE — Anesthesia Preprocedure Evaluation (Signed)
Anesthesia Evaluation  Patient identified by MRN, date of birth, ID band Patient awake    Reviewed: NPO status   Airway Mallampati: III  TM Distance: >3 FB Neck ROM: full    Dental no notable dental hx.    Pulmonary Current Smoker,    Pulmonary exam normal        Cardiovascular Exercise Tolerance: Good negative cardio ROS Normal cardiovascular exam     Neuro/Psych  Headaches, negative psych ROS   GI/Hepatic Neg liver ROS, GERD  Controlled,  Endo/Other  negative endocrine ROS  Renal/GU Renal disease (kidney stones)  negative genitourinary   Musculoskeletal   Abdominal   Peds  Hematology negative hematology ROS (+)   Anesthesia Other Findings                                         Reproductive/Obstetrics                             Anesthesia Physical Anesthesia Plan  ASA: II  Anesthesia Plan: MAC   Post-op Pain Management:    Induction:   Airway Management Planned:   Additional Equipment:   Intra-op Plan:   Post-operative Plan:   Informed Consent: I have reviewed the patients History and Physical, chart, labs and discussed the procedure including the risks, benefits and alternatives for the proposed anesthesia with the patient or authorized representative who has indicated his/her understanding and acceptance.     Plan Discussed with: CRNA  Anesthesia Plan Comments:         Anesthesia Quick Evaluation

## 2015-11-13 ENCOUNTER — Encounter: Payer: Self-pay | Admitting: Gastroenterology

## 2015-11-17 ENCOUNTER — Encounter: Payer: Self-pay | Admitting: Gastroenterology

## 2015-11-23 ENCOUNTER — Encounter: Payer: Self-pay | Admitting: Gastroenterology

## 2015-12-17 ENCOUNTER — Encounter: Payer: 59 | Admitting: Family Medicine

## 2015-12-24 ENCOUNTER — Encounter: Payer: Self-pay | Admitting: Family Medicine

## 2015-12-24 ENCOUNTER — Ambulatory Visit (INDEPENDENT_AMBULATORY_CARE_PROVIDER_SITE_OTHER): Payer: 59 | Admitting: Family Medicine

## 2015-12-24 DIAGNOSIS — Z0000 Encounter for general adult medical examination without abnormal findings: Secondary | ICD-10-CM

## 2015-12-24 NOTE — Assessment & Plan Note (Signed)
USPSTF grade A and B recommendations reviewed with patient; age-appropriate recommendations, preventive care, screening tests, etc discussed and encouraged; healthy living encouraged; see AVS for patient education given to patient  

## 2015-12-24 NOTE — Patient Instructions (Addendum)
Smoking Cessation, Tips for Success If you are ready to quit smoking, congratulations! You have chosen to help yourself be healthier. Cigarettes bring nicotine, tar, carbon monoxide, and other irritants into your body. Your lungs, heart, and blood vessels will be able to work better without these poisons. There are many different ways to quit smoking. Nicotine gum, nicotine patches, a nicotine inhaler, or nicotine nasal spray can help with physical craving. Hypnosis, support groups, and medicines help break the habit of smoking. WHAT THINGS CAN I DO TO MAKE QUITTING EASIER?  Here are some tips to help you quit for good:  Pick a date when you will quit smoking completely. Tell all of your friends and family about your plan to quit on that date.  Do not try to slowly cut down on the number of cigarettes you are smoking. Pick a quit date and quit smoking completely starting on that day.  Throw away all cigarettes.   Clean and remove all ashtrays from your home, work, and car.  On a card, write down your reasons for quitting. Carry the card with you and read it when you get the urge to smoke.  Cleanse your body of nicotine. Drink enough water and fluids to keep your urine clear or pale yellow. Do this after quitting to flush the nicotine from your body.  Learn to predict your moods. Do not let a bad situation be your excuse to have a cigarette. Some situations in your life might tempt you into wanting a cigarette.  Never have "just one" cigarette. It leads to wanting another and another. Remind yourself of your decision to quit.  Change habits associated with smoking. If you smoked while driving or when feeling stressed, try other activities to replace smoking. Stand up when drinking your coffee. Brush your teeth after eating. Sit in a different chair when you read the paper. Avoid alcohol while trying to quit, and try to drink fewer caffeinated beverages. Alcohol and caffeine may urge you to  smoke.  Avoid foods and drinks that can trigger a desire to smoke, such as sugary or spicy foods and alcohol.  Ask people who smoke not to smoke around you.  Have something planned to do right after eating or having a cup of coffee. For example, plan to take a walk or exercise.  Try a relaxation exercise to calm you down and decrease your stress. Remember, you may be tense and nervous for the first 2 weeks after you quit, but this will pass.  Find new activities to keep your hands busy. Play with a pen, coin, or rubber band. Doodle or draw things on paper.  Brush your teeth right after eating. This will help cut down on the craving for the taste of tobacco after meals. You can also try mouthwash.   Use oral substitutes in place of cigarettes. Try using lemon drops, carrots, cinnamon sticks, or chewing gum. Keep them handy so they are available when you have the urge to smoke.  When you have the urge to smoke, try deep breathing.  Designate your home as a nonsmoking area.  If you are a heavy smoker, ask your health care provider about a prescription for nicotine chewing gum. It can ease your withdrawal from nicotine.  Reward yourself. Set aside the cigarette money you save and buy yourself something nice.  Look for support from others. Join a support group or smoking cessation program. Ask someone at home or at work to help you with your plan   to quit smoking.  Always ask yourself, "Do I need this cigarette or is this just a reflex?" Tell yourself, "Today, I choose not to smoke," or "I do not want to smoke." You are reminding yourself of your decision to quit.  Do not replace cigarette smoking with electronic cigarettes (commonly called e-cigarettes). The safety of e-cigarettes is unknown, and some may contain harmful chemicals.  If you relapse, do not give up! Plan ahead and think about what you will do the next time you get the urge to smoke. HOW WILL I FEEL WHEN I QUIT SMOKING? You  may have symptoms of withdrawal because your body is used to nicotine (the addictive substance in cigarettes). You may crave cigarettes, be irritable, feel very hungry, cough often, get headaches, or have difficulty concentrating. The withdrawal symptoms are only temporary. They are strongest when you first quit but will go away within 10-14 days. When withdrawal symptoms occur, stay in control. Think about your reasons for quitting. Remind yourself that these are signs that your body is healing and getting used to being without cigarettes. Remember that withdrawal symptoms are easier to treat than the major diseases that smoking can cause.  Even after the withdrawal is over, expect periodic urges to smoke. However, these cravings are generally short lived and will go away whether you smoke or not. Do not smoke! WHAT RESOURCES ARE AVAILABLE TO HELP ME QUIT SMOKING? Your health care provider can direct you to community resources or hospitals for support, which may include:  Group support.  Education.  Hypnosis.  Therapy.   This information is not intended to replace advice given to you by your health care provider. Make sure you discuss any questions you have with your health care provider.   Document Released: 10/30/2003 Document Revised: 02/21/2014 Document Reviewed: 07/19/2012 Elsevier Interactive Patient Education 2016 [place] Maintenance, Male A healthy lifestyle and preventative care can promote health and wellness.  Maintain regular health, dental, and eye exams.  Eat a healthy diet. Foods like vegetables, fruits, whole grains, low-fat dairy products, and lean protein foods contain the nutrients you need and are low in calories. Decrease your intake of foods high in solid fats, added sugars, and salt. Get information about a proper diet from your health care provider, if necessary.  Regular physical exercise is one of the most important things you can do for your health.  Most adults should get at least 150 minutes of moderate-intensity exercise (any activity that increases your heart rate and causes you to sweat) each week. In addition, most adults need muscle-strengthening exercises on 2 or more days a week.   Maintain a healthy weight. The body mass index (BMI) is a screening tool to identify possible weight problems. It provides an estimate of body fat based on height and weight. Your health care provider can find your BMI and can help you achieve or maintain a healthy weight. For males 20 years and older:  A BMI below 18.5 is considered underweight.  A BMI of 18.5 to 24.9 is normal.  A BMI of 25 to 29.9 is considered overweight.  A BMI of 30 and above is considered obese.  Maintain normal blood lipids and cholesterol by exercising and minimizing your intake of saturated fat. Eat a balanced diet with plenty of fruits and vegetables. Blood tests for lipids and cholesterol should begin at age 63 and be repeated every 5 years. If your lipid or cholesterol levels are high, you are over age 32,  or you are at high risk for heart disease, you may need your cholesterol levels checked more frequently.Ongoing high lipid and cholesterol levels should be treated with medicines if diet and exercise are not working.  If you smoke, find out from your health care provider how to quit. If you do not use tobacco, do not start.  Lung cancer screening is recommended for adults aged 65-80 years who are at high risk for developing lung cancer because of a history of smoking. A yearly low-dose CT scan of the lungs is recommended for people who have at least a 30-pack-year history of smoking and are current smokers or have quit within the past 15 years. A pack year of smoking is smoking an average of 1 pack of cigarettes a day for 1 year (for example, a 30-pack-year history of smoking could mean smoking 1 pack a day for 30 years or 2 packs a day for 15 years). Yearly screening should  continue until the smoker has stopped smoking for at least 15 years. Yearly screening should be stopped for people who develop a health problem that would prevent them from having lung cancer treatment.  If you choose to drink alcohol, do not have more than 2 drinks per day. One drink is considered to be 12 oz (360 mL) of beer, 5 oz (150 mL) of wine, or 1.5 oz (45 mL) of liquor.  Avoid the use of street drugs. Do not share needles with anyone. Ask for help if you need support or instructions about stopping the use of drugs.  High blood pressure causes heart disease and increases the risk of stroke. High blood pressure is more likely to develop in:  People who have blood pressure in the end of the normal range (100-139/85-89 mm Hg).  People who are overweight or obese.  People who are African American.  If you are 48-27 years of age, have your blood pressure checked every 3-5 years. If you are 5 years of age or older, have your blood pressure checked every year. You should have your blood pressure measured twice--once when you are at a hospital or clinic, and once when you are not at a hospital or clinic. Record the average of the two measurements. To check your blood pressure when you are not at a hospital or clinic, you can use:  An automated blood pressure machine at a pharmacy.  A home blood pressure monitor.  If you are 32-48 years old, ask your health care provider if you should take aspirin to prevent heart disease.  Diabetes screening involves taking a blood sample to check your fasting blood sugar level. This should be done once every 3 years after age 3 if you are at a normal weight and without risk factors for diabetes. Testing should be considered at a younger age or be carried out more frequently if you are overweight and have at least 1 risk factor for diabetes.  Colorectal cancer can be detected and often prevented. Most routine colorectal cancer screening begins at the age of  4 and continues through age 37. However, your health care provider may recommend screening at an earlier age if you have risk factors for colon cancer. On a yearly basis, your health care provider may provide home test kits to check for hidden blood in the stool. A small camera at the end of a tube may be used to directly examine the colon (sigmoidoscopy or colonoscopy) to detect the earliest forms of colorectal cancer. Talk to  your health care provider about this at age 77 when routine screening begins. A direct exam of the colon should be repeated every 5-10 years through age 80, unless early forms of precancerous polyps or small growths are found.  People who are at an increased risk for hepatitis B should be screened for this virus. You are considered at high risk for hepatitis B if:  You were born in a country where hepatitis B occurs often. Talk with your health care provider about which countries are considered high risk.  Your parents were born in a high-risk country and you have not received a shot to protect against hepatitis B (hepatitis B vaccine).  You have HIV or AIDS.  You use needles to inject street drugs.  You live with, or have sex with, someone who has hepatitis B.  You are a man who has sex with other men (MSM).  You get hemodialysis treatment.  You take certain medicines for conditions like cancer, organ transplantation, and autoimmune conditions.  Hepatitis C blood testing is recommended for all people born from [birth year] through 1965 and any individual with known risk factors for hepatitis C.  Healthy men should no longer receive prostate-specific antigen (PSA) blood tests as part of routine cancer screening. Talk to your health care provider about prostate cancer screening.  Testicular cancer screening is not recommended for adolescents or adult males who have no symptoms. Screening includes self-exam, a health care provider exam, and other screening tests. Consult with  your health care provider about any symptoms you have or any concerns you have about testicular cancer.  Practice safe sex. Use condoms and avoid high-risk sexual practices to reduce the spread of sexually transmitted infections (STIs).  You should be screened for STIs, including gonorrhea and chlamydia if:  You are sexually active and are younger than 24 years.  You are older than 24 years, and your health care provider tells you that you are at risk for this type of infection.  Your sexual activity has changed since you were last screened, and you are at an increased risk for chlamydia or gonorrhea. Ask your health care provider if you are at risk.  If you are at risk of being infected with HIV, it is recommended that you take a prescription medicine daily to prevent HIV infection. This is called pre-exposure prophylaxis (PrEP). You are considered at risk if:  You are a man who has sex with other men (MSM).  You are a heterosexual man who is sexually active with multiple partners.  You take drugs by injection.  You are sexually active with a partner who has HIV.  Talk with your health care provider about whether you are at high risk of being infected with HIV. If you choose to begin PrEP, you should first be tested for HIV. You should then be tested every 3 months for as long as you are taking PrEP.  Use sunscreen. Apply sunscreen liberally and repeatedly throughout the day. You should seek shade when your shadow is shorter than you. Protect yourself by wearing long sleeves, pants, a wide-brimmed hat, and sunglasses year round whenever you are outdoors.  Tell your health care provider of new moles or changes in moles, especially if there is a change in shape or color. Also, tell your health care provider if a mole is larger than the size of a pencil eraser.  A one-time screening for abdominal aortic aneurysm (AAA) and surgical repair of large AAAs by ultrasound is recommended  for men  aged 7-75 years who are current or former smokers.  Stay current with your vaccines (immunizations).   This information is not intended to replace advice given to you by your health care provider. Make sure you discuss any questions you have with your health care provider.   Document Released: 07/30/2007 Document Revised: 02/21/2014 Document Reviewed: 06/28/2010 Elsevier Interactive Patient Education [employer].

## 2015-12-24 NOTE — Progress Notes (Signed)
Patient ID: Willie Hess, male   DOB: Mar 17, 1965, 50 y.o.   MRN: [id number]   Subjective:   Willie Hess is a 50 y.o. male here for a complete physical exam  Interim issues since last visit: had colonoscopy; no problems  USPSTF grade A and B recommendations Alcohol:  Depression:  Depression screen [facility] 2/9 12/24/2015 10/12/2015 12/11/2014  Decreased Interest 0 0 0  Down, Depressed, Hopeless 0 0 0  PHQ - 2 Score 0 0 0   Hypertension: excellent Obesity: no Tobacco use: going to start chantix and will quit HIV, hep B, hep C: declined STD testing and prevention (chl/gon/syphilis): declined Lipids: UTD Glucose: UTD Colorectal cancer: UTD Breast cancer: no lumps Lung cancer: n/a, start chest CT at age 87 Osteoporosis: no steroids AAA: n/a Aspirin: 10 year risk assessement is 5.5% as a smoker; 2.5% as a NON-smoker if he will quit, reviewed; had big work-up on the heart, EKG, stress test in his mid-40s, had echo; everything came back perfect; mother has longevity on mother's side of the family Diet: not much red meat, likes his steak, not often, once in a while, meat and two veggies, pork roast, beef roast, chicken; not enough fruits and veggies Exercise: very active at work, going all day long Skin cancer: no worrisome moles Prostate cancer: start 74, no fam hx  Past Medical History:  Diagnosis Date  . GERD (gastroesophageal reflux disease)   . Headache    migraines 1x/6 mos (lesser HAs more often)  . Kidney stone    Past Surgical History:  Procedure Laterality Date  . COLONOSCOPY WITH PROPOFOL N/A 11/12/2015   Procedure: COLONOSCOPY WITH PROPOFOL;  Surgeon: Lucilla Lame, MD;  Location: [place];  Service: Endoscopy;  Laterality: N/A;  . ESOPHAGOGASTRODUODENOSCOPY    . POLYPECTOMY  11/12/2015   Procedure: POLYPECTOMY;  Surgeon: Lucilla Lame, MD;  Location: [place];  Service: Endoscopy;;  . URETEROSCOPY WITH HOLMIUM LASER LITHOTRIPSY Left 09/23/2014   Procedure: URETEROSCOPY, CYSTOSCOPY WITH STENT PLACEMENT, HOLMIUM STAND BY ;  Surgeon: Royston Cowper, MD;  Location: [facility];  Service: Urology;  Laterality: Left;  . WISDOM TOOTH EXTRACTION     Family History  Problem Relation Age of Onset  . Thyroid disease Mother   . Heart disease Father    Social History  Substance Use Topics  . Smoking status: Current Every Day Smoker    Packs/day: 1.00    Years: 30.00    Types: Cigarettes  . Smokeless tobacco: Never Used  . Alcohol use No   Review of Systems  Objective:   Vitals:   12/24/15 0834  BP: (!) 108/58  Pulse: 87  Resp: 14  Temp: 98.5 F (36.9 C)  TempSrc: Oral  SpO2: 97%  Weight: 186 lb (84.4 kg)   Body mass index is 25.23 kg/m. Wt Readings from Last 3 Encounters:  12/24/15 186 lb (84.4 kg)  11/12/15 187 lb (84.8 kg)  10/27/15 187 lb 12.8 oz (85.2 kg)   Physical Exam  Constitutional: He appears well-developed and well-nourished. No distress.  HENT:  Head: Normocephalic and atraumatic.  Nose: Nose normal.  Mouth/Throat: Oropharynx is clear and moist.  Eyes: EOM are normal. No scleral icterus.  Neck: No JVD present. No thyromegaly present.  Cardiovascular: Normal rate, regular rhythm and normal heart sounds.   Pulmonary/Chest: Effort normal and breath sounds normal. No respiratory distress. He has no wheezes. He has no rales.  Abdominal: Soft. Bowel sounds are normal. He exhibits no distension.  There is no tenderness. There is no guarding.  Musculoskeletal: Normal range of motion. He exhibits no edema.  Lymphadenopathy:    He has no cervical adenopathy.  Neurological: He is alert. He displays normal reflexes. He exhibits normal muscle tone. Coordination normal.  Skin: Skin is warm and dry. No rash noted. He is not diaphoretic. No erythema. No pallor.  Psychiatric: He has a normal mood and affect. His behavior is normal. Judgment and thought content normal.    Assessment/Plan:   Problem List Items Addressed  This Visit      Other   Preventative health care    USPSTF grade A and B recommendations reviewed with patient; age-appropriate recommendations, preventive care, screening tests, etc discussed and encouraged; healthy living encouraged; see AVS for patient education given to patient         Follow up plan: Return in about 1 year (around 12/23/2016) for complete physical.  An After Visit Summary was printed and given to the patient.

## 2015-12-24 NOTE — Progress Notes (Signed)
BP (!) 108/58   Pulse 87   Temp 98.5 F (36.9 C) (Oral)   Resp 14   Wt 186 lb (84.4 kg)   SpO2 97%   BMI 25.23 kg/m    Subjective:    Patient ID: Willie Hess, male    DOB: 02/01/1966, 50 y.o.   MRN: [id number]  HPI: Willie Hess is a 50 y.o. male  Chief Complaint  Patient presents with  . Annual Exam    Depression screen [facility] 2/9 12/24/2015 10/12/2015 12/11/2014  Decreased Interest 0 0 0  Down, Depressed, Hopeless 0 0 0  PHQ - 2 Score 0 0 0    No flowsheet data found.  Relevant past medical, surgical, family and social history reviewed Past Medical History:  Diagnosis Date  . GERD (gastroesophageal reflux disease)   . Headache    migraines 1x/6 mos (lesser HAs more often)  . Kidney stone    Past Surgical History:  Procedure Laterality Date  . COLONOSCOPY WITH PROPOFOL N/A 11/12/2015   Procedure: COLONOSCOPY WITH PROPOFOL;  Surgeon: Lucilla Lame, MD;  Location: [place];  Service: Endoscopy;  Laterality: N/A;  . ESOPHAGOGASTRODUODENOSCOPY    . POLYPECTOMY  11/12/2015   Procedure: POLYPECTOMY;  Surgeon: Lucilla Lame, MD;  Location: [place];  Service: Endoscopy;;  . URETEROSCOPY WITH HOLMIUM LASER LITHOTRIPSY Left 09/23/2014   Procedure: URETEROSCOPY, CYSTOSCOPY WITH STENT PLACEMENT, HOLMIUM STAND BY ;  Surgeon: Royston Cowper, MD;  Location: [facility];  Service: Urology;  Laterality: Left;  . WISDOM TOOTH EXTRACTION     Family History  Problem Relation Age of Onset  . Thyroid disease Mother   . Heart disease Father    Social History  Substance Use Topics  . Smoking status: Current Every Day Smoker    Packs/day: 1.00    Years: 30.00    Types: Cigarettes  . Smokeless tobacco: Never Used  . Alcohol use No    Interim medical history since last visit reviewed. Allergies and medications reviewed  Review of Systems Per HPI unless specifically indicated above     Objective:    BP (!) 108/58   Pulse 87   Temp 98.5 F (36.9 C) (Oral)    Resp 14   Wt 186 lb (84.4 kg)   SpO2 97%   BMI 25.23 kg/m   Wt Readings from Last 3 Encounters:  12/24/15 186 lb (84.4 kg)  11/12/15 187 lb (84.8 kg)  10/27/15 187 lb 12.8 oz (85.2 kg)    Physical Exam  Results for orders placed or performed in visit on 10/12/15  LP+Creat+Hb A1c  Result Value Ref Range   Hgb A1c MFr Bld 5.6 4.8 - 5.6 %   Creatinine, Ser 1.08 0.76 - 1.27 mg/dL   GFR calc non Af Amer 80 >59 mL/min/1.73   GFR calc Af Amer 92 >59 mL/min/1.73   Cholesterol, Total 198 100 - 199 mg/dL   Triglycerides 81 0 - 149 mg/dL   HDL 51 >39 mg/dL   VLDL Cholesterol Cal 16 5 - 40 mg/dL   LDL Calculated 131 (H) 0 - 99 mg/dL   Chol/HDL Ratio 3.9 0.0 - 5.0 ratio units  Glucose, random  Result Value Ref Range   Glucose 82 65 - 99 mg/dL      Assessment & Plan:   Problem List Items Addressed This Visit    None       Follow up plan: No Follow-up on file.  An after-visit summary was printed  and given to the patient at [place].  Please see the patient instructions which may contain other information and recommendations beyond what is mentioned above in the assessment and plan.  No orders of the defined types were placed in this encounter.   No orders of the defined types were placed in this encounter.

## 2016-01-26 ENCOUNTER — Encounter: Payer: Self-pay | Admitting: Emergency Medicine

## 2016-01-26 ENCOUNTER — Ambulatory Visit
Admission: EM | Admit: 2016-01-26 | Discharge: 2016-01-26 | Disposition: A | Discharge status: Home / Self Care | Payer: 59 | Attending: Family Medicine | Admitting: Family Medicine

## 2016-01-26 DIAGNOSIS — J0140 Acute pansinusitis, unspecified: Secondary | ICD-10-CM

## 2016-01-26 MED ORDER — PSEUDOEPHEDRINE HCL 60 MG PO TABS: 60.0000 mg | ORAL_TABLET | Freq: Four times a day (QID) | ORAL | 0 refills | Status: DC | PRN

## 2016-01-26 MED ORDER — AMOXICILLIN-POT CLAVULANATE 875-125 MG PO TABS: 1.0000 | ORAL_TABLET | Freq: Two times a day (BID) | ORAL | 0 refills | Status: DC

## 2016-01-26 NOTE — ED Provider Notes (Signed)
CSN: [id number]     Arrival date & time 01/26/16  0957 History   First MD Initiated Contact with Patient 01/26/16 1151     Chief Complaint  Patient presents with  . Facial Pain   (Consider location/radiation/quality/duration/timing/severity/associated sxs/prior Treatment) 50 year old male presents with nasal congestion and slight cough for over 2 weeks. In the past 4 days, experiencing more severe left sided facial and sinus pain and pressure, headaches, and low grade fever. Denies any GI symptoms. Has taken OTC cold medication and Mucinex with minimal relief. Daughter and wife recently ill with URI/Bronchitis last week.    The history is provided by the patient.    Past Medical History:  Diagnosis Date  . GERD (gastroesophageal reflux disease)   . Headache    migraines 1x/6 mos (lesser HAs more often)  . Kidney stone    Past Surgical History:  Procedure Laterality Date  . COLONOSCOPY WITH PROPOFOL N/A 11/12/2015   Procedure: COLONOSCOPY WITH PROPOFOL;  Surgeon: Lucilla Lame, MD;  Location: [place];  Service: Endoscopy;  Laterality: N/A;  . ESOPHAGOGASTRODUODENOSCOPY    . POLYPECTOMY  11/12/2015   Procedure: POLYPECTOMY;  Surgeon: Lucilla Lame, MD;  Location: [place];  Service: Endoscopy;;  . URETEROSCOPY WITH HOLMIUM LASER LITHOTRIPSY Left 09/23/2014   Procedure: URETEROSCOPY, CYSTOSCOPY WITH STENT PLACEMENT, HOLMIUM STAND BY ;  Surgeon: Royston Cowper, MD;  Location: [facility];  Service: Urology;  Laterality: Left;  . WISDOM TOOTH EXTRACTION     Family History  Problem Relation Age of Onset  . Thyroid disease Mother   . Heart disease Father    Social History  Substance Use Topics  . Smoking status: Current Every Day Smoker    Packs/day: 1.00    Years: 30.00    Types: Cigarettes  . Smokeless tobacco: Never Used  . Alcohol use No    Review of Systems  Constitutional: Positive for fatigue and fever. Negative for appetite change and chills.  HENT:  Positive for congestion, rhinorrhea, sinus pain, sinus pressure and sore throat. Negative for ear pain.   Eyes: Negative for discharge.  Respiratory: Positive for cough. Negative for chest tightness, shortness of breath and wheezing.   Cardiovascular: Negative for chest pain.  Gastrointestinal: Negative for abdominal pain, diarrhea, nausea and vomiting.  Musculoskeletal: Negative for arthralgias, myalgias, neck pain and neck stiffness.  Skin: Negative for rash.  Neurological: Positive for headaches. Negative for dizziness, syncope, weakness, light-headedness and numbness.  Hematological: Negative for adenopathy.    Allergies  Bee venom  Home Medications   Prior to Admission medications   Medication Sig Start Date End Date Taking? Authorizing Provider  amoxicillin-clavulanate (AUGMENTIN) 875-125 MG tablet Take 1 tablet by mouth every 12 (twelve) hours. 01/26/16   Katy Apo, NP  Omeprazole-Sodium Bicarbonate (ZEGERID) 20-1100 MG CAPS capsule Take 1 capsule by mouth 2 (two) times daily.    Historical Provider, MD  pseudoephedrine (SUDAFED) 60 MG tablet Take 1 tablet (60 mg total) by mouth every 6 (six) hours as needed for congestion. 01/26/16   Katy Apo, NP  varenicline (CHANTIX STARTING MONTH PAK) 0.5 MG X 11 & 1 MG X 42 tablet Take one 0.5 mg tablet by mouth once daily for 3 days, then increase to one 0.5 mg tablet twice daily for 4 days, then increase to one 1 mg tablet twice daily. 10/12/15   Arnetha Courser, MD   Meds Ordered and Administered this Visit  Medications - No data to display  BP (!) 150/91 (BP Location: Right Arm)   Pulse 81   Temp 98.5 F (36.9 C) (Oral)   Resp 16   Ht 6\' 2"  (1.88 m)   Wt 186 lb (84.4 kg)   SpO2 100%   BMI 23.88 kg/m  No data found.   Physical Exam  Constitutional: He is oriented to person, place, and time. He appears well-developed and well-nourished. No distress.  HENT:  Head: Normocephalic and atraumatic.  Right Ear: Hearing,  tympanic membrane, external ear and ear canal normal.  Left Ear: Hearing, tympanic membrane, external ear and ear canal normal.  Nose: Mucosal edema and rhinorrhea present. Right sinus exhibits no maxillary sinus tenderness and no frontal sinus tenderness. Left sinus exhibits maxillary sinus tenderness and frontal sinus tenderness.  Mouth/Throat: Uvula is midline and mucous membranes are normal. Posterior oropharyngeal erythema present.  Neck: Normal range of motion. Neck supple.  Cardiovascular: Normal rate, regular rhythm and normal heart sounds.   Pulmonary/Chest: Effort normal. No respiratory distress. He has decreased breath sounds in the right upper field and the left upper field. He has no wheezes. He has no rhonchi.  Lymphadenopathy:    He has no cervical adenopathy.  Neurological: He is alert and oriented to person, place, and time.  Skin: Skin is warm and dry. Capillary refill takes less than 2 seconds.  Psychiatric: He has a normal mood and affect. His behavior is normal. Judgment and thought content normal.    Urgent Care Course   Clinical Course     Procedures (including critical care time)  Labs Review Labs Reviewed - No data to display  Imaging Review No results found.   Visual Acuity Review  Right Eye Distance:   Left Eye Distance:   Bilateral Distance:    Right Eye Near:   Left Eye Near:    Bilateral Near:         MDM   1. Acute non-recurrent pansinusitis    Recommend start Augmentin 875mg  twice a day as directed. May use Sudafed 60mg  every 8 hours as needed for congestion but may raise BP- Continue to monitor.Take Ibuprofen 600mg  every 8 hours as needed for pain. Increase fluid intake to help loosen mucus. Follow-up with your primary care provider in 3 to 4 days if not improving.      Katy Apo, NP 01/27/16 2055

## 2016-01-26 NOTE — ED Triage Notes (Signed)
Patient c/o sinus congestion and pressure since Friday.  Patient denies fevers.

## 2016-01-26 NOTE — Discharge Instructions (Signed)
Start Augmentin 875mg  twice a day as directed. Use Sudafed 60mg  every 8 hours as needed for congestion. Increase fluid intake to loosen mucus. Follow-up in 3 days with your primary care provider if not improving.

## 2016-06-29 IMAGING — CT CT RENAL STONE PROTOCOL
1 of 2 series · 15 of 32 positions shown, 19 images · non-contrast
Comparison: None.

CLINICAL DATA: Left flank pain for 1 day

EXAM:
CT ABDOMEN AND PELVIS WITHOUT CONTRAST
TECHNIQUE: Multidetector CT imaging of the abdomen and pelvis was performed
following the standard protocol without oral or intravenous contrast
material administration.

[Series 2: stone standard full · axial · 0.79mm/px · z∈[-1024,-578]mm · 15 of 97 slices shown, 19 images]
[im 4/97  soft-tissue]
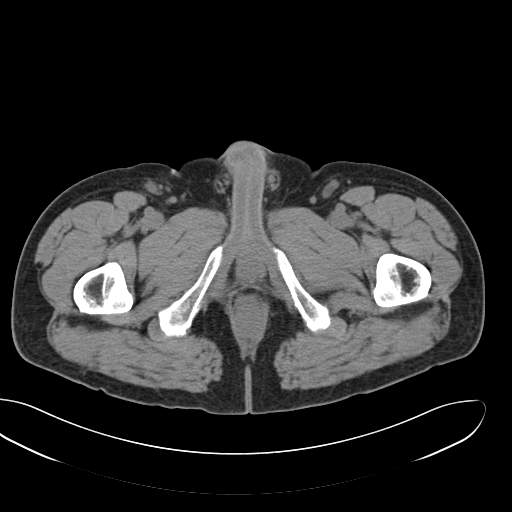
[im 4/97  bone]
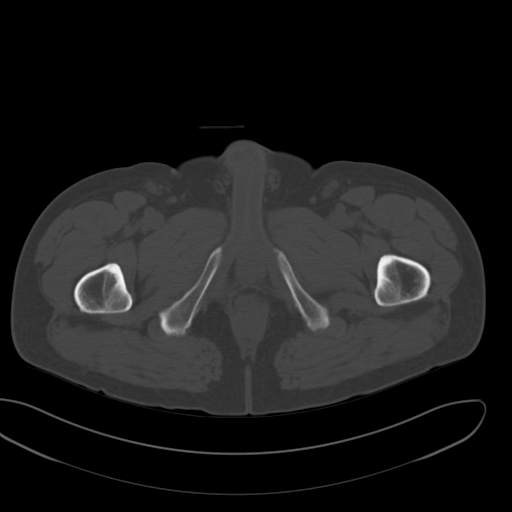
[im 12/97  soft-tissue]
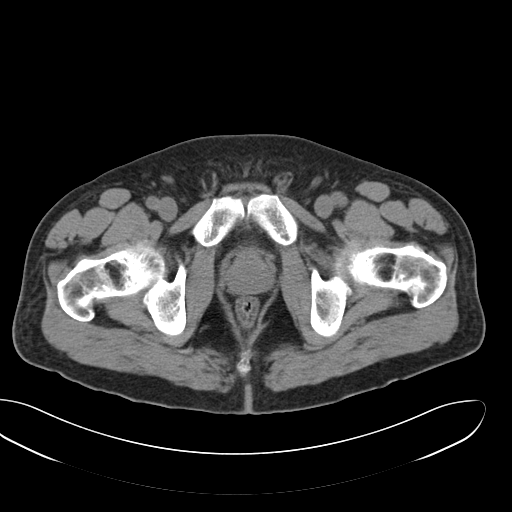
[im 20/97  soft-tissue]
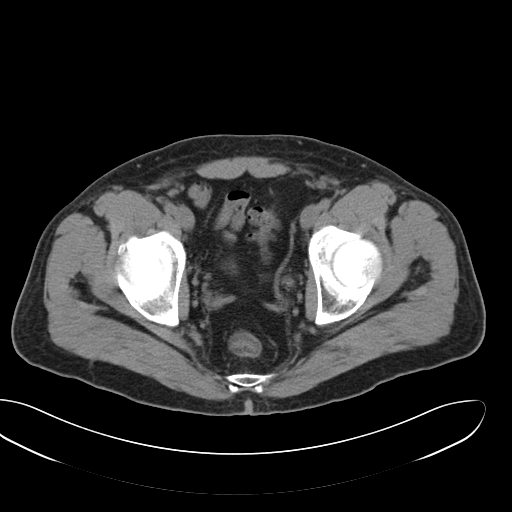
[im 27/97  soft-tissue]
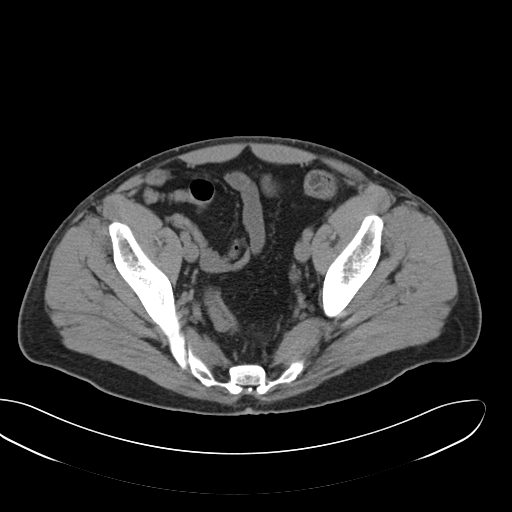
[im 35/97  soft-tissue]
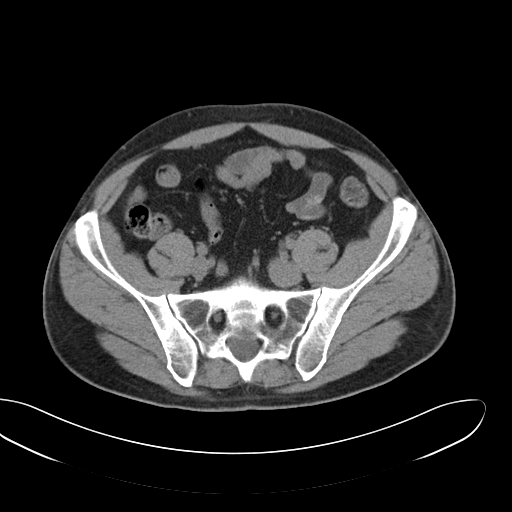
[im 43/97  soft-tissue]
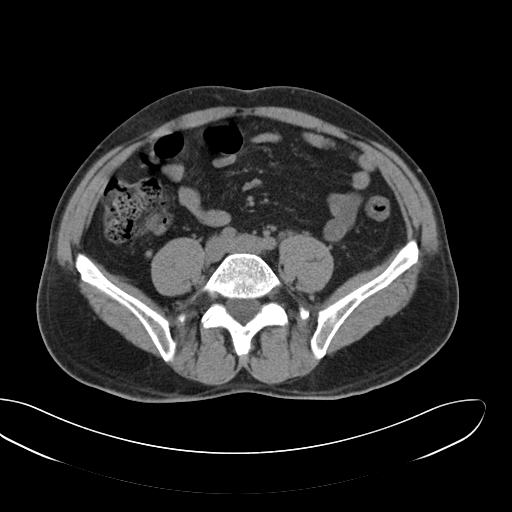
[im 50/97  soft-tissue]
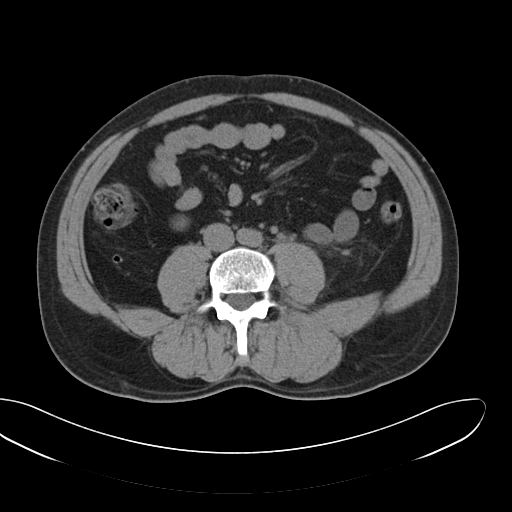
[im 54/97  soft-tissue]
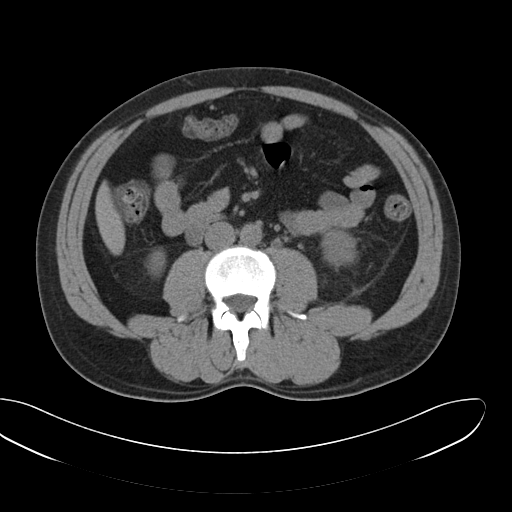
[im 62/97  soft-tissue]
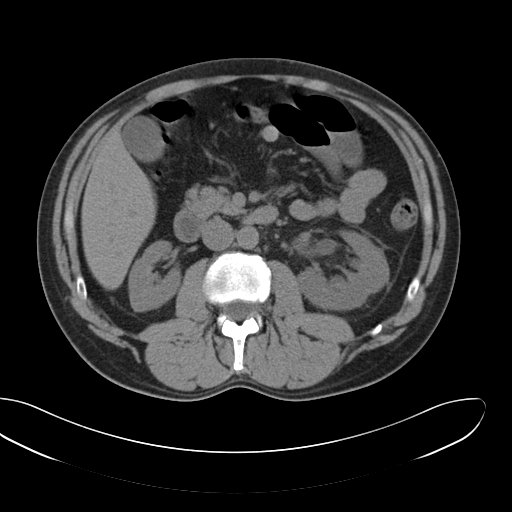
[im 62/97  bone]
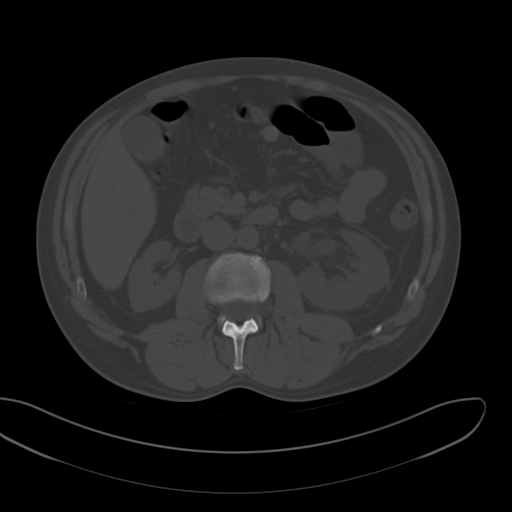
[im 70/97  soft-tissue]
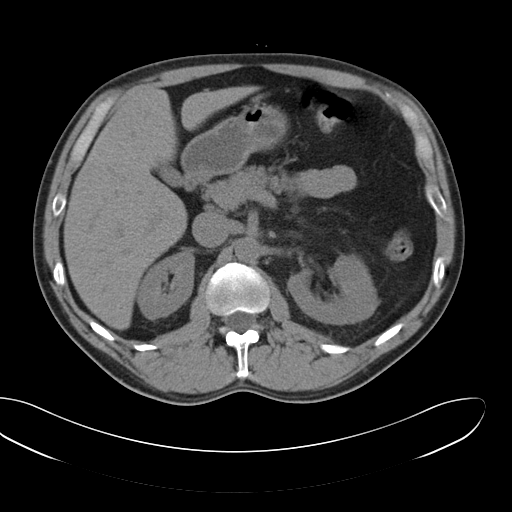
[im 77/97  soft-tissue]
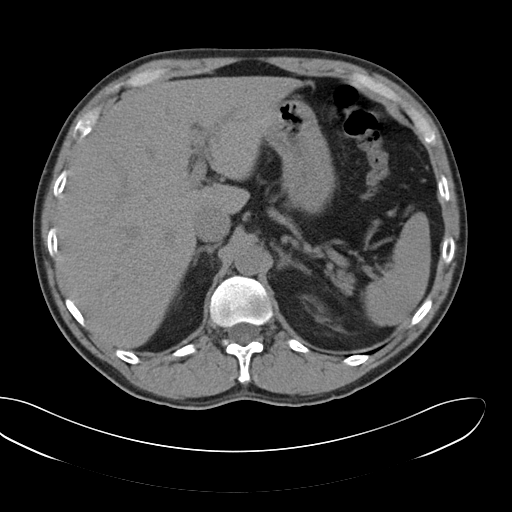
[im 81/97  lung]
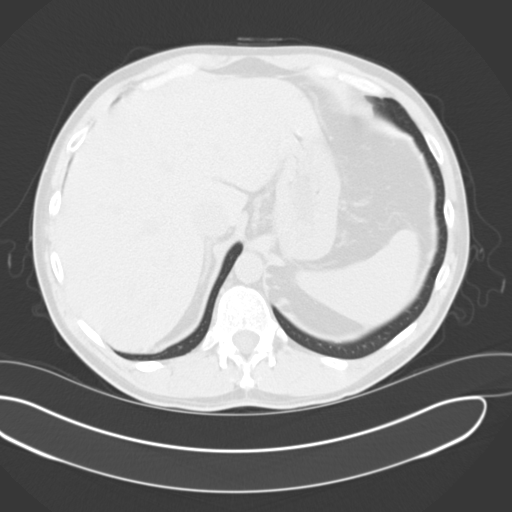
[im 85/97  soft-tissue]
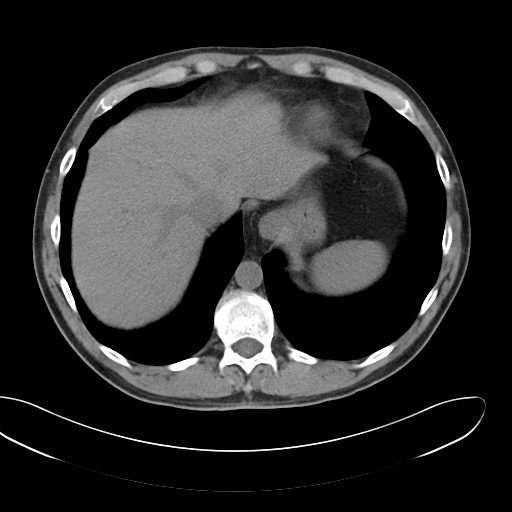
[im 85/97  lung]
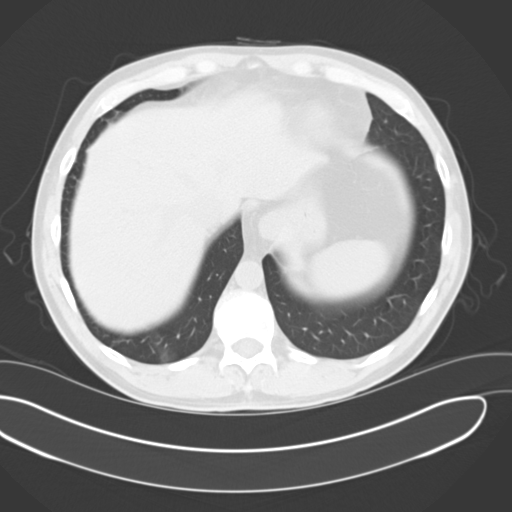
[im 89/97  lung]
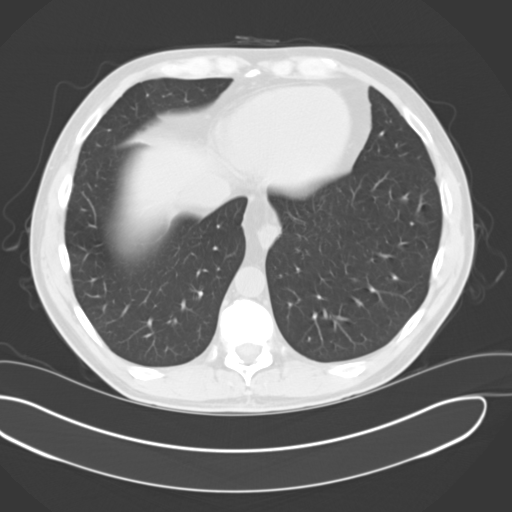
[im 93/97  soft-tissue]
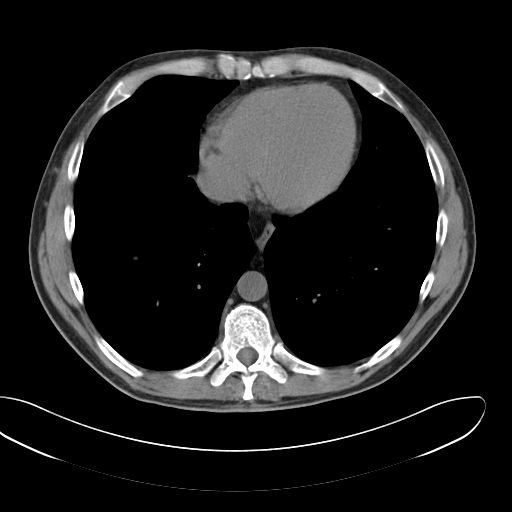
[im 93/97  lung]
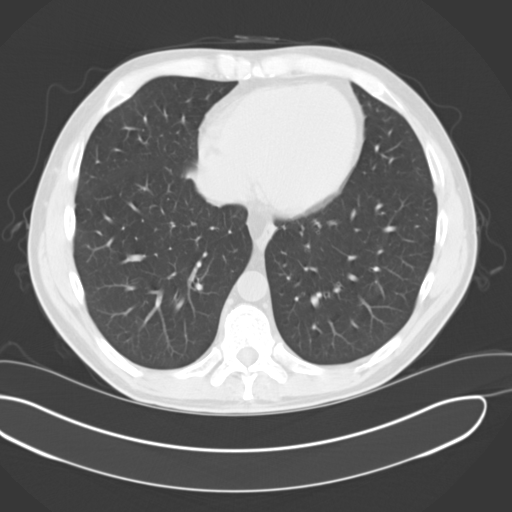

[15 of 32 positions shown; findings below may reference images not displayed]

FINDINGS: Lung bases are clear.

No focal liver lesions are identified on this noncontrast enhanced
study. Gallbladder wall is not thickened. There is no
pericholecystic fluid.

Spleen, pancreas, and adrenals appear normal.

Right kidney shows no evidence of mass or hydronephrosis. There is
no right-sided renal or ureteral calculus. On the left, there is no
renal mass or intrarenal calculus. There is moderate hydronephrosis
on the left with mild perinephric stranding on the left. There is a
calculus in the distal left ureter near the ureterovesical junction
measuring 4 x 3 mm. No other ureteral calculus is identified.

In the pelvis, the urinary bladder is midline with wall thickness
within normal limits. There is a mild degree of fat in each inguinal
ring. There is no pelvic mass or pelvic fluid collection. The
appendix appears normal.

There is no bowel obstruction. No free air or portal venous air.
There is no demonstrable ascites, adenopathy, or abscess in the
abdomen or pelvis. There is mild atherosclerotic change in the aorta
but no aneurysm appreciable. There are no blastic or lytic bone
lesions.
IMPRESSION: For a 3 mm calculus distal left ureter with moderate hydronephrosis
on the left.

No bowel obstruction.  No abscess.  Appendix appears normal.

## 2016-07-06 IMAGING — RF DG ABDOMEN 2V
3 series · 3 of 3 positions shown · non-contrast
Comparison: none

[Series 1: pädiatrie · 1 of 1 slices shown (1 of 3)]
[im 1/1]
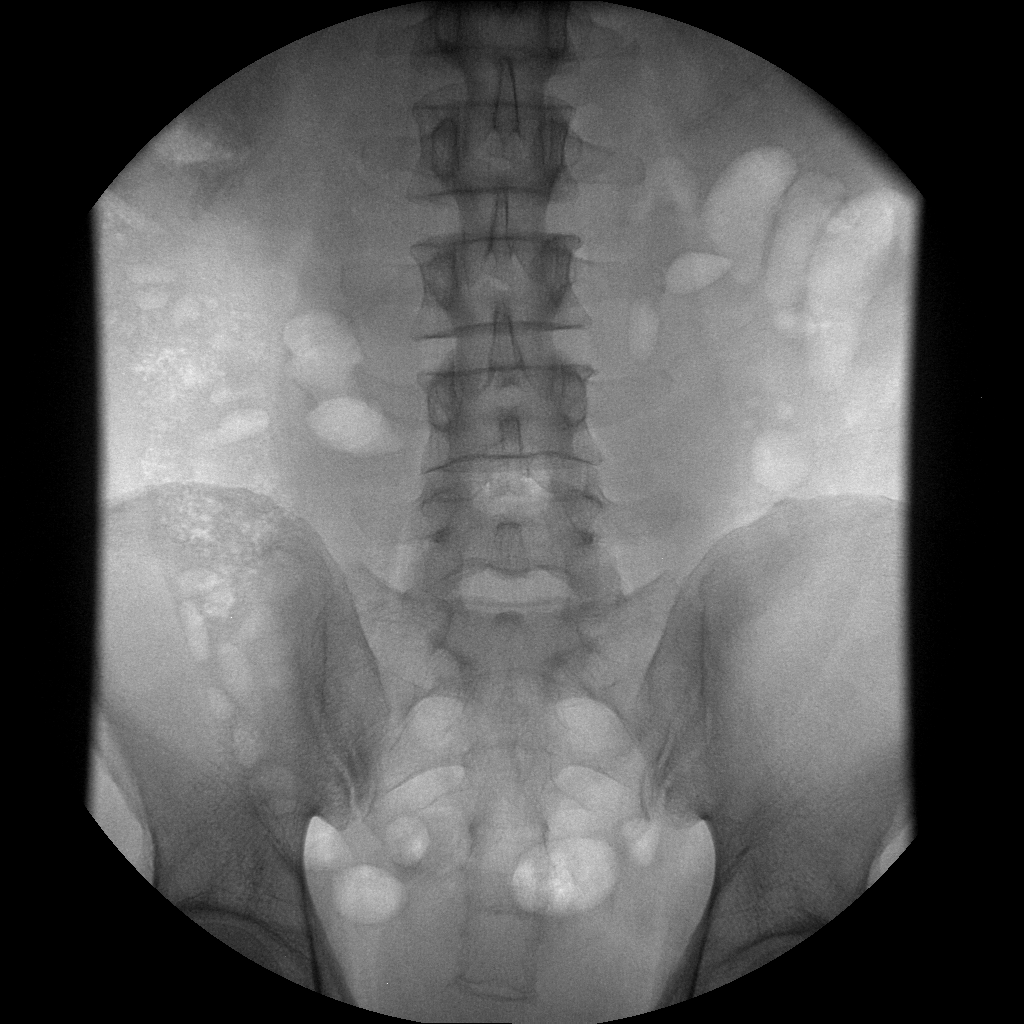

[Series 2: pädiatrie · 1 of 1 slices shown (2 of 3)]
[im 1/1]
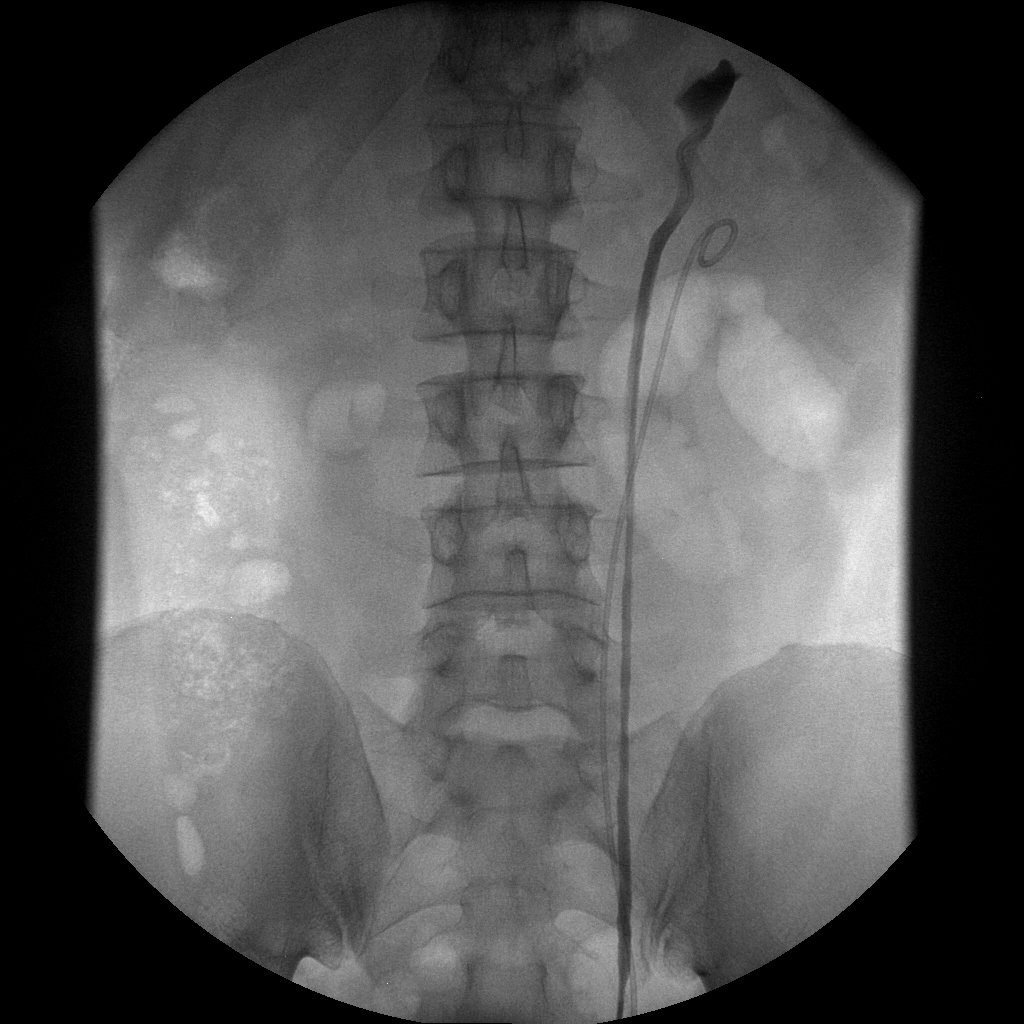

[Series 3: pädiatrie · 1 of 1 slices shown (3 of 3)]
[im 1/1]
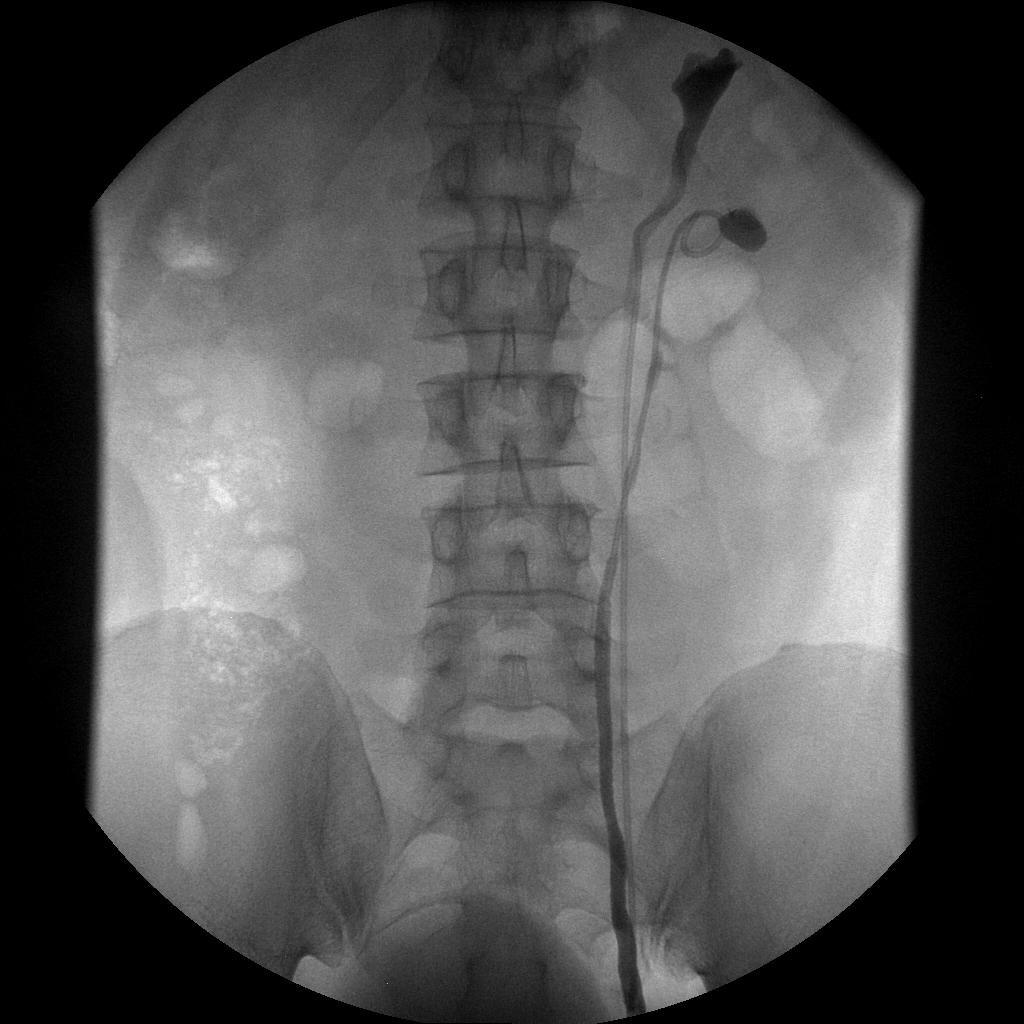

[3 of 3 positions shown; findings below may reference images not displayed]

Canned report from images found in remote index.

Refer to host system for actual result text.

## 2016-08-25 ENCOUNTER — Ambulatory Visit (INDEPENDENT_AMBULATORY_CARE_PROVIDER_SITE_OTHER): Payer: 59 | Admitting: Family Medicine

## 2016-08-25 ENCOUNTER — Encounter: Payer: Self-pay | Admitting: Family Medicine

## 2016-08-25 VITALS — BP 132/80 | HR 93 | Temp 98.2°F | Resp 16 | Ht 72.2 in | Wt 189.0 lb

## 2016-08-25 DIAGNOSIS — Z131 Encounter for screening for diabetes mellitus: Secondary | ICD-10-CM | POA: Diagnosis not present

## 2016-08-25 DIAGNOSIS — Z13220 Encounter for screening for lipoid disorders: Secondary | ICD-10-CM

## 2016-08-25 DIAGNOSIS — Z720 Tobacco use: Secondary | ICD-10-CM

## 2016-08-25 MED ORDER — VARENICLINE TARTRATE 0.5 MG X 11 & 1 MG X 42 PO MISC: ORAL | 0 refills | Status: DC

## 2016-08-25 NOTE — Patient Instructions (Addendum)
I do encourage you to quit smoking Call [phone] to sign up for smoking cessation classes You can call 1-800-QUIT-NOW to talk with a smoking cessation coach   Health Risks of Smoking Smoking cigarettes is very bad for your health. Tobacco smoke has over 200 known poisons in it. It contains the poisonous gases nitrogen oxide and carbon monoxide. There are over 60 chemicals in tobacco smoke that cause cancer. Smoking is difficult to quit because a chemical in tobacco, called nicotine, causes addiction or dependence. When you smoke and inhale, nicotine is absorbed rapidly into the bloodstream through your lungs. Both inhaled and non-inhaled nicotine may be addictive. What are the risks of cigarette smoke? Cigarette smokers have an increased risk of many serious medical problems, including:  Lung cancer.  Lung disease, such as pneumonia, bronchitis, and emphysema.  Chest pain (angina) and heart attack because the heart is not getting enough oxygen.  Heart disease and peripheral blood vessel disease.  High blood pressure (hypertension).  Stroke.  Oral cancer, including cancer of the lip, mouth, or voice box.  Bladder cancer.  Pancreatic cancer.  Cervical cancer.  Pregnancy complications, including premature birth.  Stillbirths and smaller newborn babies, birth defects, and genetic damage to sperm.  Early menopause.  Lower estrogen level for women.  Infertility.  Facial wrinkles.  Blindness.  Increased risk of broken bones (fractures).  Senile dementia.  Stomach ulcers and internal bleeding.  Delayed wound healing and increased risk of complications during surgery.  Even smoking lightly shortens your life expectancy by several years.  Because of secondhand smoke exposure, children of smokers have an increased risk of the following:  Sudden infant death syndrome (SIDS).  Respiratory infections.  Lung cancer.  Heart disease.  Ear infections.  What are  the benefits of quitting? There are many health benefits of quitting smoking. Here are some of them:  Within days of quitting smoking, your risk of having a heart attack decreases, your blood flow improves, and your lung capacity improves. Blood pressure, pulse rate, and breathing patterns start returning to normal soon after quitting.  Within months, your lungs may clear up completely.  Quitting for 10 years reduces your risk of developing lung cancer and heart disease to almost that of a nonsmoker.  People who quit may see an improvement in their overall quality of life.  How do I quit smoking? Smoking is an addiction with both physical and psychological effects, and longtime habits can be hard to change. Your health care provider can recommend:  Programs and community resources, which may include group support, education, or talk therapy.  Prescription medicines to help reduce cravings.  Nicotine replacement products, such as patches, gum, and nasal sprays. Use these products only as directed. Do not replace cigarette smoking with electronic cigarettes, which are commonly called e-cigarettes. The safety of e-cigarettes is not known, and some may contain harmful chemicals.  A combination of two or more of these methods.  Where to find more information:  American Lung Association: www.lung.org  American Cancer Society: www.cancer.org Summary  Smoking cigarettes is very bad for your health. Cigarette smokers have an increased risk of many serious medical problems, including several cancers, heart disease, and stroke.  Smoking is an addiction with both physical and psychological effects, and longtime habits can be hard to change.  By stopping right away, you can greatly reduce the risk of medical problems for you and your family.  To help you quit smoking, your health care provider can recommend programs,   programs, community resources, prescription medicines, and nicotine replacement  products such as patches, gum, and nasal sprays. This information is not intended to replace advice given to you by your health care provider. Make sure you discuss any questions you have with your health care provider. Document Released: 03/10/2004 Document Revised: 02/05/2016 Document Reviewed: 02/05/2016 Elsevier Interactive Patient Education  2017 [employer].  

## 2016-08-25 NOTE — Assessment & Plan Note (Addendum)
Discussed different forms of smoking cessation with the patient, and I encouraged cessation. Patient requests trial of Chantix.  I discussed common side effects including but not limited to nausea/vomiting, vivid dreams, behaviour changes, depression, suicidal ideation; also explained possible increased cardiovascular risk. After discussion, patient opts to try quitting with Chantix. The 1-800-QUIT-NOW number given in patient instructions. Patient was encouraged to choose a quit date about 1 week after starting medicine. More than 3 minutes spent counseling

## 2016-08-25 NOTE — Progress Notes (Signed)
BP 132/80   Pulse 93   Temp 98.2 F (36.8 C) (Oral)   Resp 16   Ht 6' 0.2" (1.834 m)   Wt 189 lb (85.7 kg)   SpO2 96%   BMI 25.49 kg/m    Subjective:    Patient ID: Willie Hess, male    DOB: January 19, 1966, 51 y.o.   MRN: [id number]  HPI: Willie Hess is a 51 y.o. male  Chief Complaint  Patient presents with  . wellness Form   HPI Patient just needs wellness screen for work He is smoking; had the Chantix, never filled He desparately needing to quit His job is really stressful; manages [employer]; employees and management, lots of pressure, numbers Smoking 1 ppd; sometimes more or less; first cig 30 minutes after waking; 30 minutes before bed is last cig; not smoking in the middle of the night No smokers at home  Depression screen [facility] 2/9 08/25/2016 12/24/2015 10/12/2015 12/11/2014  Decreased Interest 0 0 0 0  Down, Depressed, Hopeless 0 0 0 0  PHQ - 2 Score 0 0 0 0   Relevant past medical, surgical, family and social history reviewed Past Medical History:  Diagnosis Date  . GERD (gastroesophageal reflux disease)   . Headache    migraines 1x/6 mos (lesser HAs more often)  . Kidney stone    Past Surgical History:  Procedure Laterality Date  . COLONOSCOPY WITH PROPOFOL N/A 11/12/2015   Procedure: COLONOSCOPY WITH PROPOFOL;  Surgeon: Lucilla Lame, MD;  Location: [place];  Service: Endoscopy;  Laterality: N/A;  . ESOPHAGOGASTRODUODENOSCOPY    . POLYPECTOMY  11/12/2015   Procedure: POLYPECTOMY;  Surgeon: Lucilla Lame, MD;  Location: [place];  Service: Endoscopy;;  . URETEROSCOPY WITH HOLMIUM LASER LITHOTRIPSY Left 09/23/2014   Procedure: URETEROSCOPY, CYSTOSCOPY WITH STENT PLACEMENT, HOLMIUM STAND BY ;  Surgeon: Royston Cowper, MD;  Location: [facility];  Service: Urology;  Laterality: Left;  . WISDOM TOOTH EXTRACTION     Family History  Problem Relation Age of Onset  . Thyroid disease Mother   . Heart disease Father   . Heart disease Sister   .  Heart disease Paternal Grandmother   . Emphysema Paternal Grandfather    Social History   Social History  . Marital status: Married    Spouse name: N/A  . Number of children: N/A  . Years of education: N/A   Occupational History  . Not on file.   Social History Main Topics  . Smoking status: Current Every Day Smoker    Packs/day: 1.00    Years: 30.00    Types: Cigarettes  . Smokeless tobacco: Never Used  . Alcohol use No  . Drug use: No  . Sexual activity: Yes   Other Topics Concern  . Not on file   Social History Narrative  . No narrative on file   Interim medical history since last visit reviewed. Allergies and medications reviewed  Review of Systems Per HPI unless specifically indicated above     Objective:    BP 132/80   Pulse 93   Temp 98.2 F (36.8 C) (Oral)   Resp 16   Ht 6' 0.2" (1.834 m)   Wt 189 lb (85.7 kg)   SpO2 96%   BMI 25.49 kg/m   Wt Readings from Last 3 Encounters:  08/25/16 189 lb (85.7 kg)  01/26/16 186 lb (84.4 kg)  12/24/15 186 lb (84.4 kg)    Physical Exam  Constitutional: He appears  well-developed and well-nourished. No distress.  Eyes: No scleral icterus.  Cardiovascular: Normal rate and regular rhythm.   Pulmonary/Chest: Effort normal and breath sounds normal.  Neurological: He is alert.  Skin: No pallor.  Psychiatric: He has a normal mood and affect.       Assessment & Plan:   Problem List Items Addressed This Visit      Other   Tobacco abuse - Primary    Discussed different forms of smoking cessation with the patient, and I encouraged cessation. Patient requests trial of Chantix.  I discussed common side effects including but not limited to nausea/vomiting, vivid dreams, behaviour changes, depression, suicidal ideation; also explained possible increased cardiovascular risk. After discussion, patient opts to try quitting with Chantix. The 1-800-QUIT-NOW number given in patient instructions. Patient was encouraged to  choose a quit date about 1 week after starting medicine. More than 3 minutes spent counseling       Other Visit Diagnoses    Screening cholesterol level       Relevant Orders   LP+Creat+Hb A1c   Screening for diabetes mellitus       Relevant Orders   LP+Creat+Hb A1c   Glucose      Face-to-face time with patient was more than 15 minutes, >50% time spent counseling and coordination of care  Follow up plan: No Follow-up on file.  An after-visit summary was printed and given to the patient at [place].  Please see the patient instructions which may contain other information and recommendations beyond what is mentioned above in the assessment and plan.  Meds ordered this encounter  Medications  . varenicline (CHANTIX STARTING MONTH PAK) 0.5 MG X 11 & 1 MG X 42 tablet    Sig: Take one 0.5 mg tablet by mouth once daily for 3 days, then increase to one 0.5 mg tablet twice daily for 4 days, then increase to one 1 mg tablet twice daily.    Dispense:  53 tablet    Refill:  0    Orders Placed This Encounter  Procedures  . LP+Creat+Hb A1c  . Glucose

## 2016-08-26 LAB — LP+CREAT+HB A1C
CHOL/HDL RATIO: 4.7 ratio (ref 0.0–5.0)
CREATININE: 1.04 mg/dL (ref 0.76–1.27)
Cholesterol, Total: 200 mg/dL — ABNORMAL HIGH (ref 100–199)
GFR calc Af Amer: 96 mL/min/{1.73_m2} (ref >59)
GFR calc non Af Amer: 83 mL/min/{1.73_m2} (ref >59)
HDL: 43 mg/dL (ref >39)
Hgb A1c MFr Bld: 5.7 % — ABNORMAL HIGH (ref 4.8–5.6)
LDL CALC: 140 mg/dL — AB (ref 0–99)
TRIGLYCERIDES: 86 mg/dL (ref 0–149)
VLDL Cholesterol Cal: 17 mg/dL (ref 5–40)

## 2016-08-26 LAB — GLUCOSE, RANDOM: GLUCOSE: 61 mg/dL — AB (ref 65–99)

## 2016-08-31 ENCOUNTER — Other Ambulatory Visit: Payer: Self-pay

## 2016-08-31 DIAGNOSIS — E7800 Pure hypercholesterolemia, unspecified: Secondary | ICD-10-CM

## 2016-10-10 ENCOUNTER — Ambulatory Visit: Payer: 59 | Admitting: Family Medicine

## 2016-10-11 ENCOUNTER — Encounter: Payer: Self-pay | Admitting: Family Medicine

## 2016-10-11 ENCOUNTER — Ambulatory Visit (INDEPENDENT_AMBULATORY_CARE_PROVIDER_SITE_OTHER): Payer: 59 | Admitting: Family Medicine

## 2016-10-11 VITALS — BP 116/72 | HR 92 | Temp 98.2°F | Resp 16 | Wt 189.8 lb

## 2016-10-11 DIAGNOSIS — Z23 Encounter for immunization: Secondary | ICD-10-CM | POA: Diagnosis not present

## 2016-10-11 DIAGNOSIS — Z720 Tobacco use: Secondary | ICD-10-CM | POA: Diagnosis not present

## 2016-10-11 MED ORDER — VARENICLINE TARTRATE 1 MG PO TABS: 1.0000 mg | ORAL_TABLET | Freq: Two times a day (BID) | ORAL | 3 refills | Status: DC

## 2016-10-11 MED ORDER — VARENICLINE TARTRATE 0.5 MG X 11 & 1 MG X 42 PO MISC: ORAL | 0 refills | Status: DC

## 2016-10-11 NOTE — Progress Notes (Signed)
BP 116/72   Pulse 92   Temp 98.2 F (36.8 C) (Oral)   Resp 16   Wt 189 lb 12.8 oz (86.1 kg)   SpO2 95%   BMI 25.60 kg/m    Subjective:    Patient ID: Willie Hess, male    DOB: 08/07/1965, 51 y.o.   MRN: [id number]  HPI: Willie Hess is a 51 y.o. male  Chief Complaint  Patient presents with  . Appeal forms for insurance     HPI He is here as a smoker with a form for his insurance company; this is similar to the form we filled out last year Currently, he is smoking one pack per day; had been up to 1.5 ppd Job has changed and smoking less, though more stresful First cigarette of the day is about 30 minutes after waking Last cigarette of the day if 15 minutes before going to bed Does not wake up in the middle of the night to smoke Started smoking 32 years ago (age 27) He never did the Chantix No concerns about the medicine from what he had read, just didn't take it He just needs to pull the trigger and make the commitment to quit No one at home smokes Works with a few smokers  Depression screen [facility] 2/9 10/11/2016 08/25/2016 12/24/2015 10/12/2015 12/11/2014  Decreased Interest 0 0 0 0 0  Down, Depressed, Hopeless 0 0 0 0 0  PHQ - 2 Score 0 0 0 0 0    Relevant past medical, surgical, family and social history reviewed Past Medical History:  Diagnosis Date  . GERD (gastroesophageal reflux disease)   . Headache    migraines 1x/6 mos (lesser HAs more often)  . Kidney stone    Past Surgical History:  Procedure Laterality Date  . COLONOSCOPY WITH PROPOFOL N/A 11/12/2015   Procedure: COLONOSCOPY WITH PROPOFOL;  Surgeon: Lucilla Lame, MD;  Location: [place];  Service: Endoscopy;  Laterality: N/A;  . ESOPHAGOGASTRODUODENOSCOPY    . POLYPECTOMY  11/12/2015   Procedure: POLYPECTOMY;  Surgeon: Lucilla Lame, MD;  Location: [place];  Service: Endoscopy;;  . URETEROSCOPY WITH HOLMIUM LASER LITHOTRIPSY Left 09/23/2014   Procedure: URETEROSCOPY, CYSTOSCOPY WITH  STENT PLACEMENT, HOLMIUM STAND BY ;  Surgeon: Royston Cowper, MD;  Location: [facility];  Service: Urology;  Laterality: Left;  . WISDOM TOOTH EXTRACTION     Family History  Problem Relation Age of Onset  . Thyroid disease Mother   . Heart disease Father   . Heart disease Sister   . Heart disease Paternal Grandmother   . Emphysema Paternal Grandfather    Social History   Social History  . Marital status: Married    Spouse name: N/A  . Number of children: N/A  . Years of education: N/A   Occupational History  . Not on file.   Social History Main Topics  . Smoking status: Current Every Day Smoker    Packs/day: 1.00    Years: 30.00    Types: Cigarettes  . Smokeless tobacco: Never Used  . Alcohol use No  . Drug use: No  . Sexual activity: Yes   Other Topics Concern  . Not on file   Social History Narrative  . No narrative on file    Interim medical history since last visit reviewed. Allergies and medications reviewed  Review of Systems Per HPI unless specifically indicated above     Objective:    BP 116/72   Pulse 92  Temp 98.2 F (36.8 C) (Oral)   Resp 16   Wt 189 lb 12.8 oz (86.1 kg)   SpO2 95%   BMI 25.60 kg/m   Wt Readings from Last 3 Encounters:  10/11/16 189 lb 12.8 oz (86.1 kg)  08/25/16 189 lb (85.7 kg)  01/26/16 186 lb (84.4 kg)    Physical Exam  Constitutional: He appears well-developed and well-nourished. No distress.  Eyes: No scleral icterus.  Cardiovascular: Normal rate and regular rhythm.   Pulmonary/Chest: Effort normal and breath sounds normal.  Neurological: He is alert.  Skin: No pallor.  Psychiatric: He has a normal mood and affect.   Results for orders placed or performed in visit on 08/25/16  Glucose  Result Value Ref Range   Glucose 61 (L) 65 - 99 mg/dL  LP+Creat+Hb A1c  Result Value Ref Range   Hgb A1c MFr Bld 5.7 (H) 4.8 - 5.6 %   Creatinine, Ser 1.04 0.76 - 1.27 mg/dL   GFR calc non Af Amer 83 >59 mL/min/1.73   GFR  calc Af Amer 96 >59 mL/min/1.73   Cholesterol, Total 200 (H) 100 - 199 mg/dL   Triglycerides 86 0 - 149 mg/dL   HDL 43 >39 mg/dL   VLDL Cholesterol Cal 17 5 - 40 mg/dL   LDL Calculated 140 (H) 0 - 99 mg/dL   Chol/HDL Ratio 4.7 0.0 - 5.0 ratio      Assessment & Plan:   Problem List Items Addressed This Visit      Other   Tobacco abuse - Primary    Encouraged smoking cessation; we had a frank conversation about the health insurance form; I explained that I was not inclined to sign this again if he wasn't seriously going to quit; he appreciated my side of this; he is motivated to quit (financially, though he does not sound convinced of health risks of smoking); I explained I will do this form just this one more time, but will not do this next year; he understood Discussed different forms of smoking cessation with the patient, and I encouraged cessation. Patient requests trial of Chantix.  I discussed common side effects including but not limited to nausea/vomiting, vivid dreams, behaviour changes, depression, suicidal ideation; also explained possible increased cardiovascular risk. After discussion, patient opts to try quitting with Chantix. The 1-800-QUIT-NOW number given in patient instructions. Patient was encouraged to choose a quit date about 1 week after starting medicine.        Other Visit Diagnoses    Needs flu shot       Relevant Orders   Flu Vaccine QUAD 6+ mos PF IM (Fluarix Quad PF) (Completed)   Need for Tdap vaccination       Relevant Orders   Tdap vaccine greater than or equal to 7yo IM (Completed)       Follow up plan: No Follow-up on file.  An after-visit summary was printed and given to the patient at [place].  Please see the patient instructions which may contain other information and recommendations beyond what is mentioned above in the assessment and plan.  Meds ordered this encounter  Medications  . varenicline (CHANTIX STARTING MONTH PAK) 0.5 MG X 11 & 1  MG X 42 tablet    Sig: Take one 0.5 mg tablet by mouth once daily for 3 days, then increase to one 0.5 mg tablet twice daily for 4 days, then increase to one 1 mg tablet twice daily.    Dispense:  53 tablet  Refill:  0  . varenicline (CHANTIX CONTINUING MONTH PAK) 1 MG tablet    Sig: Take 1 tablet (1 mg total) by mouth 2 (two) times daily.    Dispense:  60 tablet    Refill:  3    Fill this Rx AFTER he finishes the starter month    Orders Placed This Encounter  Procedures  . Flu Vaccine QUAD 6+ mos PF IM (Fluarix Quad PF)  . Tdap vaccine greater than or equal to 7yo IM    Greater than 3 minutes spend counseling on cessation

## 2016-10-11 NOTE — Patient Instructions (Addendum)
I do encourage you to quit smoking Call [phone] to sign up for smoking cessation classes You can call 1-800-QUIT-NOW to talk with a smoking cessation coach  Start the Chantix, and then pick day 8 of the medicine as your quit date Call with any problems   Steps to Quit Smoking Smoking tobacco can be harmful to your health and can affect almost every organ in your body. Smoking puts you, and those around you, at risk for developing many serious chronic diseases. Quitting smoking is difficult, but it is one of the best things that you can do for your health. It is never too late to quit. What are the benefits of quitting smoking? When you quit smoking, you lower your risk of developing serious diseases and conditions, such as:  Lung cancer or lung disease, such as COPD.  Heart disease.  Stroke.  Heart attack.  Infertility.  Osteoporosis and bone fractures.  Additionally, symptoms such as coughing, wheezing, and shortness of breath may get better when you quit. You may also find that you get sick less often because your body is stronger at fighting off colds and infections. If you are pregnant, quitting smoking can help to reduce your chances of having a baby of low birth weight. How do I get ready to quit? When you decide to quit smoking, create a plan to make sure that you are successful. Before you quit:  Pick a date to quit. Set a date within the next two weeks to give you time to prepare.  Write down the reasons why you are quitting. Keep this list in places where you will see it often, such as on your bathroom mirror or in your car or wallet.  Identify the people, places, things, and activities that make you want to smoke (triggers) and avoid them. Make sure to take these actions: ? Throw away all cigarettes at home, at work, and in your car. ? Throw away smoking accessories, such as Scientist, research (medical). ? Clean your car and make sure to empty the ashtray. ? Clean your  home, including curtains and carpets.  Tell your family, friends, and coworkers that you are quitting. Support from your loved ones can make quitting easier.  Talk with your health care provider about your options for quitting smoking.  Find out what treatment options are covered by your health insurance.  What strategies can I use to quit smoking? Talk with your healthcare provider about different strategies to quit smoking. Some strategies include:  Quitting smoking altogether instead of gradually lessening how much you smoke over a period of time. Research shows that quitting "cold [place]" is more successful than gradually quitting.  Attending in-person counseling to help you build problem-solving skills. You are more likely to have success in quitting if you attend several counseling sessions. Even short sessions of 10 minutes can be effective.  Finding resources and support systems that can help you to quit smoking and remain smoke-free after you quit. These resources are most helpful when you use them often. They can include: ? Online chats with a Social worker. ? Telephone quitlines. ? Careers information officer. ? Support groups or group counseling. ? Text messaging programs. ? Mobile phone applications.  Taking medicines to help you quit smoking. (If you are pregnant or breastfeeding, talk with your health care provider first.) Some medicines contain nicotine and some do not. Both types of medicines help with cravings, but the medicines that include nicotine help to relieve withdrawal symptoms. Your health  care provider may recommend: ? Nicotine patches, gum, or lozenges. ? Nicotine inhalers or sprays. ? Non-nicotine medicine that is taken by mouth.  Talk with your health care provider about combining strategies, such as taking medicines while you are also receiving in-person counseling. Using these two strategies together makes you more likely to succeed in quitting than if you  used either strategy on its own. If you are pregnant or breastfeeding, talk with your health care provider about finding counseling or other support strategies to quit smoking. Do not take medicine to help you quit smoking unless told to do so by your health care provider. What things can I do to make it easier to quit? Quitting smoking might feel overwhelming at first, but there is a lot that you can do to make it easier. Take these important actions:  Reach out to your family and friends and ask that they support and encourage you during this time. Call telephone quitlines, reach out to support groups, or work with a counselor for support.  Ask people who smoke to avoid smoking around you.  Avoid places that trigger you to smoke, such as bars, parties, or smoke-break areas at work.  Spend time around people who do not smoke.  Lessen stress in your life, because stress can be a smoking trigger for some people. To lessen stress, try: ? Exercising regularly. ? Deep-breathing exercises. ? Yoga. ? Meditating. ? Performing a body scan. This involves closing your eyes, scanning your body from head to toe, and noticing which parts of your body are particularly tense. Purposefully relax the muscles in those areas.  Download or purchase mobile phone or tablet apps (applications) that can help you stick to your quit plan by providing reminders, tips, and encouragement. There are many free apps, such as QuitGuide from the [employer] Office manager for Disease Control and Prevention). You can find other support for quitting smoking (smoking cessation) through smokefree.gov and other websites.  How will I feel when I quit smoking? Within the first 24 hours of quitting smoking, you may start to feel some withdrawal symptoms. These symptoms are usually most noticeable 2-3 days after quitting, but they usually do not last beyond 2-3 weeks. Changes or symptoms that you might experience include:  Mood  swings.  Restlessness, anxiety, or irritation.  Difficulty concentrating.  Dizziness.  Strong cravings for sugary foods in addition to nicotine.  Mild weight gain.  Constipation.  Nausea.  Coughing or a sore throat.  Changes in how your medicines work in your body.  A depressed mood.  Difficulty sleeping (insomnia).  After the first 2-3 weeks of quitting, you may start to notice more positive results, such as:  Improved sense of smell and taste.  Decreased coughing and sore throat.  Slower heart rate.  Lower blood pressure.  Clearer skin.  The ability to breathe more easily.  Fewer sick days.  Quitting smoking is very challenging for most people. Do not get discouraged if you are not successful the first time. Some people need to make many attempts to quit before they achieve long-term success. Do your best to stick to your quit plan, and talk with your health care provider if you have any questions or concerns. This information is not intended to replace advice given to you by your health care provider. Make sure you discuss any questions you have with your health care provider. Document Released: 01/25/2001 Document Revised: 09/29/2015 Document Reviewed: 06/17/2014 Elsevier Interactive Patient Education  2017 [employer].

## 2016-10-11 NOTE — Assessment & Plan Note (Addendum)
Encouraged smoking cessation; we had a frank conversation about the health insurance form; I explained that I was not inclined to sign this again if he wasn't seriously going to quit; he appreciated my side of this; he is motivated to quit (financially, though he does not sound convinced of health risks of smoking); I explained I will do this form just this one more time, but will not do this next year; he understood Discussed different forms of smoking cessation with the patient, and I encouraged cessation. Patient requests trial of Chantix.  I discussed common side effects including but not limited to nausea/vomiting, vivid dreams, behaviour changes, depression, suicidal ideation; also explained possible increased cardiovascular risk. After discussion, patient opts to try quitting with Chantix. The 1-800-QUIT-NOW number given in patient instructions. Patient was encouraged to choose a quit date about 1 week after starting medicine.

## 2016-12-29 ENCOUNTER — Encounter: Payer: 59 | Admitting: Family Medicine

## 2017-04-01 NOTE — Progress Notes (Signed)
Closing out lab/order note open since:  08/31/16

## 2017-04-01 NOTE — Progress Notes (Signed)
Closing out lab/order note open since:  10/12/15

## 2017-07-27 ENCOUNTER — Encounter: Payer: Self-pay | Admitting: Family Medicine

## 2017-07-27 ENCOUNTER — Ambulatory Visit: Payer: 59 | Admitting: Family Medicine

## 2017-07-27 VITALS — BP 122/72 | HR 91 | Temp 98.4°F | Resp 14 | Ht 72.0 in | Wt 182.3 lb

## 2017-07-27 DIAGNOSIS — Z87891 Personal history of nicotine dependence: Secondary | ICD-10-CM

## 2017-07-27 DIAGNOSIS — Z566 Other physical and mental strain related to work: Secondary | ICD-10-CM

## 2017-07-27 DIAGNOSIS — K4090 Unilateral inguinal hernia, without obstruction or gangrene, not specified as recurrent: Secondary | ICD-10-CM

## 2017-07-27 HISTORY — DX: Personal history of nicotine dependence: Z87.891

## 2017-07-27 MED ORDER — SERTRALINE HCL 50 MG PO TABS: ORAL_TABLET | ORAL | 0 refills | Status: DC

## 2017-07-27 NOTE — Progress Notes (Signed)
BP 122/72   Pulse 91   Temp 98.4 F (36.9 C) (Oral)   Resp 14   Ht 6' (1.829 m)   Wt 182 lb 4.8 oz (82.7 kg)   SpO2 96%   BMI 24.72 kg/m    Subjective:    Patient ID: Willie Hess, male    DOB: 11-04-1965, 52 y.o.   MRN: [id number]  HPI: Willie Hess is a 53 y.o. male  Chief Complaint  Patient presents with  . Inguinal Hernia    left, feels knot and push back in with some pain    HPI Patient is here for an acute reason; he feels a knot in his left inguinal region He thinks he has a hernia He wouuld roll over in the middle of the night and felt a real serious burn, like a cigarette lighter touching him Started happening more frequently About two days, same feeling, happening about every night It is tender and bothering him today; not devastatin pain, but can feel it He put his hand down there and the knot was there and he was able to push it in; when he released, it popped back out When he coughs, it pops out No change in the bowels; no blood in the stool No weight loss or night sweats Friend at work has one, pretty severe and waited too long He quit smoking; he does not feel secure in it; he did the Chantix thing twice, went through two sets, makes it for a little while, then quit and smoked and quit and smoked; "it's been a battle"; I asked what might make him pick back up and he says it's a stress thing; 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 GERD (gastroesophageal reflux disease)   . Headache    migraines 1x/6 mos (lesser HAs more often)  . Kidney stone    Past Surgical History:  Procedure Laterality  Date  . COLONOSCOPY WITH PROPOFOL N/A 11/12/2015   Procedure: COLONOSCOPY WITH PROPOFOL;  Surgeon: Lucilla Lame, MD;  Location: [place];  Service: Endoscopy;  Laterality: N/A;  . ESOPHAGOGASTRODUODENOSCOPY    . POLYPECTOMY  11/12/2015   Procedure: POLYPECTOMY;  Surgeon: Lucilla Lame, MD;  Location: [place];  Service: Endoscopy;;  . URETEROSCOPY WITH HOLMIUM LASER LITHOTRIPSY Left 09/23/2014   Procedure: URETEROSCOPY, CYSTOSCOPY WITH STENT PLACEMENT, HOLMIUM STAND BY ;  Surgeon: Royston Cowper, MD;  Location: [facility];  Service: Urology;  Laterality: Left;  . WISDOM TOOTH EXTRACTION     Family History  Problem Relation Age of Onset  . Thyroid disease Mother   . Heart disease Father   . Heart disease Sister   . Heart disease Paternal Grandmother   . Emphysema Paternal Grandfather    Social History   Tobacco Use  . Smoking status: Former Smoker    Packs/day: 1.00    Years: 30.00    Pack years: 30.00    Types: Cigarettes    Last attempt to quit: 06/01/2017    Years since quitting: 0.1  . Smokeless tobacco: Never Used  Substance  Use Topics  . Alcohol use: No  . Drug use: No    Interim medical history since last visit reviewed. Allergies and medications reviewed  Review of Systems Per HPI unless specifically indicated above     Objective:    BP 122/72   Pulse 91   Temp 98.4 F (36.9 C) (Oral)   Resp 14   Ht 6' (1.829 m)   Wt 182 lb 4.8 oz (82.7 kg)   SpO2 96%   BMI 24.72 kg/m   Wt Readings from Last 3 Encounters:  07/27/17 182 lb 4.8 oz (82.7 kg)  10/11/16 189 lb 12.8 oz (86.1 kg)  08/25/16 189 lb (85.7 kg)    Physical Exam  Constitutional: He appears well-developed and well-nourished. No distress.  Eyes: No scleral icterus.  Cardiovascular: Normal rate and regular rhythm.  No murmur heard. Pulmonary/Chest: Effort normal and breath sounds normal.  Abdominal: Soft. Bowel sounds are normal. There is no tenderness. A hernia is present. Hernia  confirmed positive in the left inguinal area (reducible, easily protrudes with cough).  Neurological: He is alert.  Skin: No pallor.  Psychiatric: He has a normal mood and affect.    Results for orders placed or performed in visit on 08/25/16  Glucose  Result Value Ref Range   Glucose 61 (L) 65 - 99 mg/dL  LP+Creat+Hb A1c  Result Value Ref Range   Hgb A1c MFr Bld 5.7 (H) 4.8 - 5.6 %   Creatinine, Ser 1.04 0.76 - 1.27 mg/dL   GFR calc non Af Amer 83 >59 mL/min/1.73   GFR calc Af Amer 96 >59 mL/min/1.73   Cholesterol, Total 200 (H) 100 - 199 mg/dL   Triglycerides 86 0 - 149 mg/dL   HDL 43 >39 mg/dL   VLDL Cholesterol Cal 17 5 - 40 mg/dL   LDL Calculated 140 (H) 0 - 99 mg/dL   Chol/HDL Ratio 4.7 0.0 - 5.0 ratio      Assessment & Plan:   Problem List Items Addressed This Visit      Other   Hx of smoking    Praised patient for quitting; I hear that he is not confident in staying off of cigarettes; we discussed what might make him pick back up; we'll start low dose SSRI to try to ameliorate work-place stress; he will try other means of dealing with stress; call in 3-4 weeks with an update, sooner if any dark thoughts, problems, side effects       Other Visit Diagnoses    Inguinal hernia of left side without obstruction or gangrene    -  Primary   refer to general surgeon; described s/s of incarceration, go straight to ER if occurs; he agrees   Relevant Orders   Ambulatory referral to General Surgery   Stress at work       will start low dose SSRI to help ameliorate the stress, perhaps keep him from picking cigarettes back up; plan for 3 months perhaps total treatment       Follow up plan: Return if symptoms worsen or fail to improve.  An after-visit summary was printed and given to the patient at [place].  Please see the patient instructions which may contain other information and recommendations beyond what is mentioned above in the assessment and plan.  Meds ordered  this encounter  Medications  . sertraline (ZOLOFT) 50 MG tablet    Sig: Take half of a pill daily for six days, then one pill daily  Dispense:  27 tablet    Refill:  0    Orders Placed This Encounter  Procedures  . Ambulatory referral to General Surgery

## 2017-07-27 NOTE — Patient Instructions (Addendum)
We'll have you see the general surgeon If you have not heard anything from my staff in a week about any orders/referrals/studies from today, please contact us here to follow-up [phone] Start the new sertraline Please call me with an update in 3-4 weeks; call sooner if any problems (dark thoughts, side effects, depression, etc.)   Inguinal Hernia, Adult An inguinal hernia is when fat or the intestines push through the area where the leg meets the lower abdomen (groin) and create a rounded lump (bulge). This condition develops over time. There are three types of inguinal hernias. These types include:  Hernias that can be pushed back into the belly (are reducible).  Hernias that are not reducible (are incarcerated).  Hernias that are not reducible and lose their blood supply (are strangulated). This type of hernia requires emergency surgery.  What are the causes? This condition is caused by having a weak spot in the muscles or tissue. This weakness lets the hernia poke through. This condition can be triggered by:  Suddenly straining the muscles of the lower abdomen.  Lifting heavy objects.  Straining to have a bowel movement. Difficult bowel movements (constipation) can lead to this.  Coughing.  What increases the risk? This condition is more likely to develop in:  Men.  Pregnant women.  People who: ? Are overweight. ? Work in jobs that require long periods of standing or heavy lifting. ? Have had an inguinal hernia before. ? Smoke or have lung disease. These factors can lead to long-lasting (chronic) coughing.  What are the signs or symptoms? Symptoms can depend on the size of the hernia. Often, a small inguinal hernia has no symptoms. Symptoms of a larger hernia include:  A lump in the groin. This is easier to see when the person is standing. It might not be visible when he or she is lying down.  Pain or burning in the groin. This occurs especially when lifting,  straining, or coughing.  A dull ache or a feeling of pressure in the groin.  A lump in the scrotum in men.  Symptoms of a strangulated inguinal hernia can include:  A bulge in the groin that is very painful and tender to the touch.  A bulge that turns red or purple.  Fever, nausea, and vomiting.  The inability to have a bowel movement or to pass gas.  How is this diagnosed? This condition is diagnosed with a medical history and physical exam. Your health care provider may feel your groin area and ask you to cough. How is this treated? Treatment for this condition varies depending on the size of your hernia and whether you have symptoms. If you do not have symptoms, your health care provider may have you watch your hernia carefully and come in for follow-up visits. If your hernia is larger or if you have symptoms, your treatment will include surgery. Follow these instructions at home: Lifestyle  Drink enough fluid to keep your urine clear or pale yellow.  Eat a diet that includes a lot of fiber. Eat plenty of fruits, vegetables, and whole grains. Talk with your health care provider if you have questions.  Avoid lifting heavy objects.  Avoid standing for long periods of time.  Do not use tobacco products, including cigarettes, chewing tobacco, or e-cigarettes. If you need help quitting, ask your health care provider.  Maintain a healthy weight. General instructions  Do not try to force the hernia back in.  Watch your hernia for any changes  in color or size. Let your health care provider know if any changes occur.  Take over-the-counter and prescription medicines only as told by your health care provider.  Keep all follow-up visits as told by your health care provider. This is important. Contact a health care provider if:  You have a fever.  You have new symptoms.  Your symptoms get worse. Get help right away if:  You have pain in the groin that suddenly gets  worse.  A bulge in the groin gets bigger suddenly and does not go down.  You are a man and you have a sudden pain in the scrotum, or the size of your scrotum suddenly changes.  A bulge in the groin area becomes red or purple and is painful to the touch.  You have nausea or vomiting that does not go away.  You feel your heart beating a lot more quickly than normal.  You cannot have a bowel movement or pass gas. This information is not intended to replace advice given to you by your health care provider. Make sure you discuss any questions you have with your health care provider. Document Released: 06/19/2008 Document Revised: 07/09/2015 Document Reviewed: 12/11/2013 Elsevier Interactive Patient Education  2018 [employer].

## 2017-07-27 NOTE — Assessment & Plan Note (Signed)
Praised patient for quitting; I hear that he is not confident in staying off of cigarettes; we discussed what might make him pick back up; we'll start low dose SSRI to try to ameliorate work-place stress; he will try other means of dealing with stress; call in 3-4 weeks with an update, sooner if any dark thoughts, problems, side effects

## 2017-08-10 ENCOUNTER — Encounter: Payer: Self-pay | Admitting: Surgery

## 2017-08-10 ENCOUNTER — Ambulatory Visit (INDEPENDENT_AMBULATORY_CARE_PROVIDER_SITE_OTHER): Payer: Managed Care, Other (non HMO) | Admitting: Surgery

## 2017-08-10 DIAGNOSIS — K4090 Unilateral inguinal hernia, without obstruction or gangrene, not specified as recurrent: Secondary | ICD-10-CM

## 2017-08-10 NOTE — Patient Instructions (Signed)
You have chose to have your hernia repaired. This will be done by Dr. Dahlia Byes on 08/24/2017 at [facility].  Please see your (blue) Pre-care information that you have been given today.  You will need to arrange to be out of work for 2 weeks and then return with a lifting restrictions for 4 more weeks. Please send any FMLA paperwork prior to surgery and we will fill this out and fax it back to your employer within 3 business days.  You may have a bruise in your groin and also swelling and brusing in your testicle area. You may use ice 4-5 times daily for 15-20 minutes each time. Make sure that you place a barrier between you and the ice pack. To decrease the swelling, you may roll up a bath towel and place it vertically in between your thighs with your testicles resting on the towel. You will want to keep this area elevated as much as possible for several days following surgery.    Inguinal Hernia, Adult Muscles help keep everything in the body in its proper place. But if a weak spot in the muscles develops, something can poke through. That is called a hernia. When this happens in the lower part of the belly (abdomen), it is called an inguinal hernia. (It takes its name from a part of the body in this region called the inguinal canal.) A weak spot in the wall of muscles lets some fat or part of the small intestine bulge through. An inguinal hernia can develop at any age. Men get them more often than women. CAUSES  In adults, an inguinal hernia develops over time.  It can be triggered by:  Suddenly straining the muscles of the lower abdomen.  Lifting heavy objects.  Straining to have a bowel movement. Difficult bowel movements (constipation) can lead to this.  Constant coughing. This may be caused by smoking or lung disease.  Being overweight.  Being pregnant.  Working at a job that requires long periods of standing or heavy lifting.  Having had an inguinal hernia before. One type can be an  emergency situation. It is called a strangulated inguinal hernia. It develops if part of the small intestine slips through the weak spot and cannot get back into the abdomen. The blood supply can be cut off. If that happens, part of the intestine may die. This situation requires emergency surgery. SYMPTOMS  Often, a small inguinal hernia has no symptoms. It is found when a healthcare provider does a physical exam. Larger hernias usually have symptoms.   In adults, symptoms may include:  A lump in the groin. This is easier to see when the person is standing. It might disappear when lying down.  In men, a lump in the scrotum.  Pain or burning in the groin. This occurs especially when lifting, straining or coughing.  A dull ache or feeling of pressure in the groin.  Signs of a strangulated hernia can include:  A bulge in the groin that becomes very painful and tender to the touch.  A bulge that turns red or purple.  Fever, nausea and vomiting.  Inability to have a bowel movement or to pass gas. DIAGNOSIS  To decide if you have an inguinal hernia, a healthcare provider will probably do a physical examination.  This will include asking questions about any symptoms you have noticed.  The healthcare provider might feel the groin area and ask you to cough. If an inguinal hernia is felt, the healthcare provider  may try to slide it back into the abdomen.  Usually no other tests are needed. TREATMENT  Treatments can vary. The size of the hernia makes a difference. Options include:  Watchful waiting. This is often suggested if the hernia is small and you have had no symptoms.  No medical procedure will be done unless symptoms develop.  You will need to watch closely for symptoms. If any occur, contact your healthcare provider right away.  Surgery. This is used if the hernia is larger or you have symptoms.  Open surgery. This is usually an outpatient procedure (you will not stay  overnight in a hospital). An cut (incision) is made through the skin in the groin. The hernia is put back inside the abdomen. The weak area in the muscles is then repaired by herniorrhaphy or hernioplasty. Herniorrhaphy: in this type of surgery, the weak muscles are sewn back together. Hernioplasty: a patch or mesh is used to close the weak area in the abdominal wall.  Laparoscopy. In this procedure, a surgeon makes small incisions. A thin tube with a tiny video camera (called a laparoscope) is put into the abdomen. The surgeon repairs the hernia with mesh by looking with the video camera and using two long instruments. HOME CARE INSTRUCTIONS   After surgery to repair an inguinal hernia:  You will need to take pain medicine prescribed by your healthcare provider. Follow all directions carefully.  You will need to take care of the wound from the incision.  Your activity will be restricted for awhile. This will probably include no heavy lifting for several weeks. You also should not do anything too active for a few weeks. When you can return to work will depend on the type of job that you have.  During "watchful waiting" periods, you should:  Maintain a healthy weight.  Eat a diet high in fiber (fruits, vegetables and whole grains).  Drink plenty of fluids to avoid constipation. This means drinking enough water and other liquids to keep your urine clear or pale yellow.  Do not lift heavy objects.  Do not stand for long periods of time.  Quit smoking. This should keep you from developing a frequent cough. SEEK MEDICAL CARE IF:   A bulge develops in your groin area.  You feel pain, a burning sensation or pressure in the groin. This might be worse if you are lifting or straining.  You develop a fever of more than 100.5 F (38.1 C). SEEK IMMEDIATE MEDICAL CARE IF:   Pain in the groin increases suddenly.  A bulge in the groin gets bigger suddenly and does not go down.  For men, there  is sudden pain in the scrotum. Or, the size of the scrotum increases.  A bulge in the groin area becomes red or purple and is painful to touch.  You have nausea or vomiting that does not go away.  You feel your heart beating much faster than normal.  You cannot have a bowel movement or pass gas.  You develop a fever of more than 102.0 F (38.9 C).   This information is not intended to replace advice given to you by your health care provider. Make sure you discuss any questions you have with your health care provider.   Document Released: 06/19/2008 Document Revised: 04/25/2011 Document Reviewed: 08/04/2014 Elsevier Interactive Patient Education [employer].

## 2017-08-11 ENCOUNTER — Ambulatory Visit: Payer: 59 | Admitting: Family Medicine

## 2017-08-11 ENCOUNTER — Encounter: Payer: Self-pay | Admitting: Surgery

## 2017-08-11 NOTE — H&P (View-Only) (Signed)
Patient ID: Willie Hess, male   DOB: 1966-01-07, 52 y.o.   MRN: [id number]  HPI Willie Hess is a 52 y.o. male in consultation at the request of Dr. Harlow Mares for a left inguinal hernia.  He has noticed it for the last few weeks and reports some intermittent burning sensation on his left inguinal area.  This pain is mild to moderate intensity and he reports that feels like a cigarette lighter touching his left groin.  He is able to push back his bulge.  No evidence of incarceration or strangulation.  He is able to perform more than 6 METS of activity without any shortness of breath or chest pain.  He does not smoke anymore.  He denies any fevers no chills any diarrhea. Ports that the pain gets better with over-the-counter medications. No previous abdominal operations. After review a CT scan in 201 which show evidence of a left inguinal hernia.  No evidence of right inguinal hernia.  No other acute abdominal abnormalities. Creatinine, glucose  is normal. CBC from 2 years ago is normal and there has not been any major changes in his overall health  HPI  Past Medical History:  Diagnosis Date  . Benign neoplasm of ascending colon   . Benign neoplasm of cecum   . Benign neoplasm of sigmoid colon   . Benign neoplasm of transverse colon   . Decreased creatinine clearance 12/11/2014  . GERD (gastroesophageal reflux disease)   . GERD without esophagitis 12/11/2014  . Headache    migraines 1x/6 mos (lesser HAs more often)  . Hx of smoking 07/27/2017  . Kidney stone   . Preventative health care 10/12/2015    Past Surgical History:  Procedure Laterality Date  . COLONOSCOPY WITH PROPOFOL N/A 11/12/2015   Procedure: COLONOSCOPY WITH PROPOFOL;  Surgeon: Lucilla Lame, MD;  Location: [place];  Service: Endoscopy;  Laterality: N/A;  . ESOPHAGOGASTRODUODENOSCOPY    . POLYPECTOMY  11/12/2015   Procedure: POLYPECTOMY;  Surgeon: Lucilla Lame, MD;  Location: [place];  Service: Endoscopy;;   . URETEROSCOPY WITH HOLMIUM LASER LITHOTRIPSY Left 09/23/2014   Procedure: URETEROSCOPY, CYSTOSCOPY WITH STENT PLACEMENT, HOLMIUM STAND BY ;  Surgeon: Royston Cowper, MD;  Location: [facility];  Service: Urology;  Laterality: Left;  . WISDOM TOOTH EXTRACTION      Family History  Problem Relation Age of Onset  . Thyroid disease Mother   . Heart disease Father   . Heart disease Sister   . Heart disease Paternal Grandmother   . Emphysema Paternal Grandfather     Social History Social History   Tobacco Use  . Smoking status: Former Smoker    Packs/day: 1.00    Years: 30.00    Pack years: 30.00    Types: Cigarettes    Last attempt to quit: 06/01/2017    Years since quitting: 0.1  . Smokeless tobacco: Never Used  Substance Use Topics  . Alcohol use: No  . Drug use: No    Allergies  Allergen Reactions  . Bee Venom Swelling    Current Outpatient Medications  Medication Sig Dispense Refill  . Omeprazole-Sodium Bicarbonate (ZEGERID) 20-1100 MG CAPS capsule Take 1 capsule by mouth 2 (two) times daily.    . sertraline (ZOLOFT) 50 MG tablet Take half of a pill daily for six days, then one pill daily 27 tablet 0   No current facility-administered medications for this visit.      Review of Systems Full ROS  was asked  and was negative except for the information on the HPI  Physical Exam  CONSTITUTIONAL: NAD EYES: Pupils are equal, round, and reactive to light, Sclera are non-icteric. EARS, NOSE, MOUTH AND THROAT: The oropharynx is clear. The oral mucosa is pink and moist. Hearing is intact to voice. LYMPH NODES:  Lymph nodes in the neck are normal. RESPIRATORY:  Lungs are clear. There is normal respiratory effort, with equal breath sounds bilaterally, and without pathologic use of accessory muscles. CARDIOVASCULAR: Heart is regular without murmurs, gallops, or rubs. GI: The abdomen is soft, nontender, and nondistended. There are no palpable masses. There is no  hepatosplenomegaly. There are normal bowel sounds in all quadrants. There is a reducible Left Inguinal hernia moderately tender to palpation GU: Rectal deferred.   MUSCULOSKELETAL: Normal muscle strength and tone. No cyanosis or edema.   SKIN: Turgor is good and there are no pathologic skin lesions or ulcers. NEUROLOGIC: Motor and sensation is grossly normal. Cranial nerves are grossly intact. PSYCH:  Oriented to person, place and time. Affect is normal.  Data Reviewed  I have personally reviewed the patient's imaging, laboratory findings and medical records.    Assessment/Plan 52 year old male with a symptomatic inguinal hernia. I Do recommend surgical repair.I have explained the procedure, risks, and aftercare of inguinal hernia repair to [place].   Risks include but are not limited to bleeding, infection, wound problems, anesthesia, recurrence, bladder or intestine injury, urinary retention, testicular dysfunction, chronic pain, mesh problems and conversion to open.  He  seems to understand and agrees to proceed.  Questions were answered to his stated satisfaction. I Do think that he is an excellent candidate for robotic assisted laparoscopic inguinal hernia repair.  Discussed with the patient detail that if we find bilateral defects we will go ahead and address it at the same time. We will plan to schedule his surgery in a couple weeks. A copy of this report will be sent to the referring provider.  Caroleen Hamman, MD FACS General Surgeon 08/11/2017, 12:02 PM

## 2017-08-11 NOTE — Progress Notes (Signed)
Patient ID: Willie Hess, male   DOB: [birth year], 52 y.o.   MRN: [id number]  HPI Willie Hess is a 52 y.o. male in consultation at the request of Dr. Harlow Mares for a left inguinal hernia.  He has noticed it for the last few weeks and reports some intermittent burning sensation on his left inguinal area.  This pain is mild to moderate intensity and he reports that feels like a cigarette lighter touching his left groin.  He is able to push back his bulge.  No evidence of incarceration or strangulation.  He is able to perform more than 6 METS of activity without any shortness of breath or chest pain.  He does not smoke anymore.  He denies any fevers no chills any diarrhea. Ports that the pain gets better with over-the-counter medications. No previous abdominal operations. After review a CT scan in 201 which show evidence of a left inguinal hernia.  No evidence of right inguinal hernia.  No other acute abdominal abnormalities. Creatinine, glucose  is normal. CBC from 2 years ago is normal and there has not been any major changes in his overall health  HPI  Past Medical History:  Diagnosis Date  . Benign neoplasm of ascending colon   . Benign neoplasm of cecum   . Benign neoplasm of sigmoid colon   . Benign neoplasm of transverse colon   . Decreased creatinine clearance 12/11/2014  . GERD (gastroesophageal reflux disease)   . GERD without esophagitis 12/11/2014  . Headache    migraines 1x/6 mos (lesser HAs more often)  . Hx of smoking 07/27/2017  . Kidney stone   . Preventative health care 10/12/2015    Past Surgical History:  Procedure Laterality Date  . COLONOSCOPY WITH PROPOFOL N/A 11/12/2015   Procedure: COLONOSCOPY WITH PROPOFOL;  Surgeon: Lucilla Lame, MD;  Location: [place];  Service: Endoscopy;  Laterality: N/A;  . ESOPHAGOGASTRODUODENOSCOPY    . POLYPECTOMY  11/12/2015   Procedure: POLYPECTOMY;  Surgeon: Lucilla Lame, MD;  Location: [place];  Service: Endoscopy;;   . URETEROSCOPY WITH HOLMIUM LASER LITHOTRIPSY Left 09/23/2014   Procedure: URETEROSCOPY, CYSTOSCOPY WITH STENT PLACEMENT, HOLMIUM STAND BY ;  Surgeon: Royston Cowper, MD;  Location: [facility];  Service: Urology;  Laterality: Left;  . WISDOM TOOTH EXTRACTION      Family History  Problem Relation Age of Onset  . Thyroid disease Mother   . Heart disease Father   . Heart disease Sister   . Heart disease Paternal Grandmother   . Emphysema Paternal Grandfather     Social History Social History   Tobacco Use  . Smoking status: Former Smoker    Packs/day: 1.00    Years: 30.00    Pack years: 30.00    Types: Cigarettes    Last attempt to quit: 06/01/2017    Years since quitting: 0.1  . Smokeless tobacco: Never Used  Substance Use Topics  . Alcohol use: No  . Drug use: No    Allergies  Allergen Reactions  . Bee Venom Swelling    Current Outpatient Medications  Medication Sig Dispense Refill  . Omeprazole-Sodium Bicarbonate (ZEGERID) 20-1100 MG CAPS capsule Take 1 capsule by mouth 2 (two) times daily.    . sertraline (ZOLOFT) 50 MG tablet Take half of a pill daily for six days, then one pill daily 27 tablet 0   No current facility-administered medications for this visit.      Review of Systems Full ROS  was asked  and was negative except for the information on the HPI  Physical Exam  CONSTITUTIONAL: NAD EYES: Pupils are equal, round, and reactive to light, Sclera are non-icteric. EARS, NOSE, MOUTH AND THROAT: The oropharynx is clear. The oral mucosa is pink and moist. Hearing is intact to voice. LYMPH NODES:  Lymph nodes in the neck are normal. RESPIRATORY:  Lungs are clear. There is normal respiratory effort, with equal breath sounds bilaterally, and without pathologic use of accessory muscles. CARDIOVASCULAR: Heart is regular without murmurs, gallops, or rubs. GI: The abdomen is soft, nontender, and nondistended. There are no palpable masses. There is no  hepatosplenomegaly. There are normal bowel sounds in all quadrants. There is a reducible Left Inguinal hernia moderately tender to palpation GU: Rectal deferred.   MUSCULOSKELETAL: Normal muscle strength and tone. No cyanosis or edema.   SKIN: Turgor is good and there are no pathologic skin lesions or ulcers. NEUROLOGIC: Motor and sensation is grossly normal. Cranial nerves are grossly intact. PSYCH:  Oriented to person, place and time. Affect is normal.  Data Reviewed  I have personally reviewed the patient's imaging, laboratory findings and medical records.    Assessment/Plan 52 year old male with a symptomatic inguinal hernia. I Do recommend surgical repair.I have explained the procedure, risks, and aftercare of inguinal hernia repair to [name].   Risks include but are not limited to bleeding, infection, wound problems, anesthesia, recurrence, bladder or intestine injury, urinary retention, testicular dysfunction, chronic pain, mesh problems and conversion to open.  He  seems to understand and agrees to proceed.  Questions were answered to his stated satisfaction. I Do think that he is an excellent candidate for robotic assisted laparoscopic inguinal hernia repair.  Discussed with the patient detail that if we find bilateral defects we will go ahead and address it at the same time. We will plan to schedule his surgery in a couple weeks. A copy of this report will be sent to the referring provider.  Caroleen Hamman, MD FACS General Surgeon 08/11/2017, 12:02 PM

## 2017-08-14 ENCOUNTER — Telehealth: Payer: Self-pay | Admitting: Surgery

## 2017-08-14 NOTE — Telephone Encounter (Signed)
Pt advised of pre op date/time and sx date. Sx: Dr Dahlia Byes on 08/24/17-robot assisted left inguinal hernia repair.  Pre op: 08/21/17 between 9-1:00pm--phone interview.   Patient made aware to call [phone], between 1-3:00pm the day before surgery, to find out what time to arrive.

## 2017-08-21 ENCOUNTER — Encounter
Admission: RE | Admit: 2017-08-21 | Discharge: 2017-08-21 | Disposition: A | Discharge status: Home / Self Care | Payer: Managed Care, Other (non HMO) | Source: Ambulatory Visit | Attending: Surgery | Admitting: Surgery

## 2017-08-21 ENCOUNTER — Other Ambulatory Visit: Payer: Self-pay

## 2017-08-21 HISTORY — DX: Unspecified osteoarthritis, unspecified site: M19.90

## 2017-08-21 HISTORY — DX: Duplication of ureter: Q62.5

## 2017-08-21 HISTORY — DX: Personal history of urinary calculi: Z87.442

## 2017-08-21 NOTE — Patient Instructions (Signed)
Your procedure is scheduled on: 08-24-17  Report to Same Day Surgery 2nd floor medical mall [facility] Entrance-take elevator on left to 2nd floor.  Check in with surgery information desk.) To find out your arrival time please call [phone] between 1PM - 3PM on 08-23-17  Remember: Instructions that are not followed completely may result in serious medical risk, up to and including death, or upon the discretion of your surgeon and anesthesiologist your surgery may need to be rescheduled.    _x___ 1. Do not eat food after midnight the night before your procedure. NO GUM OR CANDY AFTER MIDNIGHT.  You may drink clear liquids up to 2 hours before you are scheduled to arrive at the hospital for your procedure.  Do not drink clear liquids within 2 hours of your scheduled arrival to the hospital.  Clear liquids include  --Water or Apple juice without pulp  --Clear carbohydrate beverage such as ClearFast or Gatorade  --Black Coffee or Clear Tea (No milk, no creamers, do not add anything to the coffee or Tea     __x__ 2. No Alcohol for 24 hours before or after surgery.   __x__3. No Smoking or e-cigarettes for 24 prior to surgery.  Do not use any chewable tobacco products for at least 6 hour prior to surgery   ____  4. Bring all medications with you on the day of surgery if instructed.    __x__ 5. Notify your doctor if there is any change in your medical condition     (cold, fever, infections).    x___6. On the morning of surgery brush your teeth with toothpaste and water.  You may rinse your mouth with mouth wash if you wish.  Do not swallow any toothpaste or mouthwash.   Do not wear jewelry, make-up, hairpins, clips or nail polish.  Do not wear lotions, powders, or perfumes. You may wear deodorant.  Do not shave 48 hours prior to surgery. Men may shave face and neck.  Do not bring valuables to the hospital.    [facility] is not responsible for any belongings or valuables.     Contacts, dentures or bridgework may not be worn into surgery.  Leave your suitcase in the car. After surgery it may be brought to your room.  For patients admitted to the hospital, discharge time is determined by your treatment team.  _  Patients discharged the day of surgery will not be allowed to drive home.  You will need someone to drive you home and stay with you the night of your procedure.    Please read over the following fact sheets that you were given:   [facility] Preparing for Surgery and or MRSA Information   ____ Take anti-hypertensive listed below, cardiac, seizure, asthma,anti-reflux and psychiatric medicines. These include:  1. NONE  2.  3.  4.  5.  6.  ____Fleets enema or Magnesium Citrate as directed.   ____ Use CHG Soap or sage wipes as directed on instruction sheet   ____ Use inhalers on the day of surgery and bring to hospital day of surgery  ____ Stop Metformin and Janumet 2 days prior to surgery.    ____ Take 1/2 of usual insulin dose the night before surgery and none on the morning surgery.   ____ Follow recommendations from Cardiologist, Pulmonologist or PCP regarding stopping Aspirin, Coumadin, Plavix ,Eliquis, Effient, or Pradaxa, and Pletal.  X____Stop Anti-inflammatories such as Advil, Aleve, Ibuprofen, Motrin, Naproxen, Naprosyn, Goodies powders or aspirin  products NOW- OK to take Tylenol    ____ Stop supplements until after surgery.     ____ Bring C-Pap to the hospital.

## 2017-08-24 ENCOUNTER — Ambulatory Visit: Payer: Managed Care, Other (non HMO)

## 2017-08-24 ENCOUNTER — Other Ambulatory Visit: Payer: Managed Care, Other (non HMO)

## 2017-08-24 ENCOUNTER — Ambulatory Visit
Admission: RE | Admit: 2017-08-24 | Discharge: 2017-08-24 | Disposition: A | Discharge status: Home / Self Care | Payer: Managed Care, Other (non HMO) | Source: Ambulatory Visit | Attending: Surgery | Admitting: Surgery

## 2017-08-24 ENCOUNTER — Encounter
Admission: RE | Disposition: A | Discharge status: Home / Self Care | Payer: Self-pay | Source: Ambulatory Visit | Attending: Surgery

## 2017-08-24 ENCOUNTER — Other Ambulatory Visit: Payer: Self-pay

## 2017-08-24 ENCOUNTER — Encounter: Payer: Self-pay | Admitting: *Deleted

## 2017-08-24 DIAGNOSIS — D176 Benign lipomatous neoplasm of spermatic cord: Secondary | ICD-10-CM | POA: Diagnosis not present

## 2017-08-24 DIAGNOSIS — K4090 Unilateral inguinal hernia, without obstruction or gangrene, not specified as recurrent: Secondary | ICD-10-CM | POA: Insufficient documentation

## 2017-08-24 DIAGNOSIS — Z91030 Bee allergy status: Secondary | ICD-10-CM | POA: Insufficient documentation

## 2017-08-24 DIAGNOSIS — K219 Gastro-esophageal reflux disease without esophagitis: Secondary | ICD-10-CM | POA: Diagnosis not present

## 2017-08-24 DIAGNOSIS — Z79899 Other long term (current) drug therapy: Secondary | ICD-10-CM | POA: Insufficient documentation

## 2017-08-24 DIAGNOSIS — Z87891 Personal history of nicotine dependence: Secondary | ICD-10-CM | POA: Insufficient documentation

## 2017-08-24 DIAGNOSIS — Z86010 Personal history of colonic polyps: Secondary | ICD-10-CM | POA: Diagnosis not present

## 2017-08-24 DIAGNOSIS — Z8249 Family history of ischemic heart disease and other diseases of the circulatory system: Secondary | ICD-10-CM | POA: Diagnosis not present

## 2017-08-24 HISTORY — PX: INSERTION OF MESH: SHX5868

## 2017-08-24 HISTORY — PX: ROBOT ASSISTED INGUINAL HERNIA REPAIR: SHX6561

## 2017-08-24 SURGERY — ROBOT ASSISTED INGUINAL HERNIA REPAIR
Anesthesia: General | Site: Inguinal | Laterality: Left | Wound class: Clean

## 2017-08-24 MED ORDER — CHLORHEXIDINE GLUCONATE CLOTH 2 % EX PADS: 6.0000 | MEDICATED_PAD | Freq: Once | CUTANEOUS | Status: DC

## 2017-08-24 MED ORDER — FENTANYL CITRATE (PF) 100 MCG/2ML IJ SOLN
INTRAMUSCULAR | Status: DC | PRN
  Administered 2017-08-24: 50 ug via INTRAVENOUS
  Administered 2017-08-24: 100 ug via INTRAVENOUS
  Administered 2017-08-24: 50 ug via INTRAVENOUS

## 2017-08-24 MED ORDER — CELECOXIB 200 MG PO CAPS
ORAL_CAPSULE | ORAL | Status: AC
  Administered 2017-08-24: 200 mg via ORAL
  Filled 2017-08-24: qty 1

## 2017-08-24 MED ORDER — MIDAZOLAM HCL 2 MG/2ML IJ SOLN
INTRAMUSCULAR | Status: DC | PRN
  Administered 2017-08-24: 2 mg via INTRAVENOUS

## 2017-08-24 MED ORDER — DEXAMETHASONE SODIUM PHOSPHATE 10 MG/ML IJ SOLN
INTRAMUSCULAR | Status: AC
  Filled 2017-08-24: qty 1

## 2017-08-24 MED ORDER — LIDOCAINE HCL (CARDIAC) PF 100 MG/5ML IV SOSY
PREFILLED_SYRINGE | INTRAVENOUS | Status: DC | PRN
  Administered 2017-08-24: 60 mg via INTRAVENOUS

## 2017-08-24 MED ORDER — HYDROMORPHONE HCL 1 MG/ML IJ SOLN
INTRAMUSCULAR | Status: DC | PRN
  Administered 2017-08-24 (×2): 0.5 mg via INTRAVENOUS

## 2017-08-24 MED ORDER — BUPIVACAINE HCL 0.25 % IJ SOLN
INTRAMUSCULAR | Status: DC | PRN
  Administered 2017-08-24: 30 mL

## 2017-08-24 MED ORDER — SUGAMMADEX SODIUM 200 MG/2ML IV SOLN
INTRAVENOUS | Status: DC | PRN
  Administered 2017-08-24: 175 mg via INTRAVENOUS

## 2017-08-24 MED ORDER — CHLORHEXIDINE GLUCONATE CLOTH 2 % EX PADS
6.0000 | MEDICATED_PAD | Freq: Once | CUTANEOUS | Status: AC
  Administered 2017-08-24: 6 via TOPICAL

## 2017-08-24 MED ORDER — CELECOXIB 200 MG PO CAPS
200.0000 mg | ORAL_CAPSULE | ORAL | Status: AC
  Administered 2017-08-24: 200 mg via ORAL

## 2017-08-24 MED ORDER — HYDROMORPHONE HCL 1 MG/ML IJ SOLN
INTRAMUSCULAR | Status: AC
  Filled 2017-08-24: qty 1

## 2017-08-24 MED ORDER — FENTANYL CITRATE (PF) 100 MCG/2ML IJ SOLN
INTRAMUSCULAR | Status: AC
  Filled 2017-08-24: qty 2

## 2017-08-24 MED ORDER — FAMOTIDINE 20 MG PO TABS
ORAL_TABLET | ORAL | Status: AC
  Administered 2017-08-24: 20 mg via ORAL
  Filled 2017-08-24: qty 1

## 2017-08-24 MED ORDER — ROCURONIUM BROMIDE 100 MG/10ML IV SOLN
INTRAVENOUS | Status: DC | PRN
  Administered 2017-08-24: 20 mg via INTRAVENOUS
  Administered 2017-08-24: 50 mg via INTRAVENOUS

## 2017-08-24 MED ORDER — LACTATED RINGERS IV SOLN
INTRAVENOUS | Status: DC
  Administered 2017-08-24 (×2): via INTRAVENOUS

## 2017-08-24 MED ORDER — FENTANYL CITRATE (PF) 100 MCG/2ML IJ SOLN: 25.0000 ug | INTRAMUSCULAR | Status: DC | PRN

## 2017-08-24 MED ORDER — GABAPENTIN 300 MG PO CAPS
ORAL_CAPSULE | ORAL | Status: AC
  Administered 2017-08-24: 300 mg via ORAL
  Filled 2017-08-24: qty 1

## 2017-08-24 MED ORDER — SUGAMMADEX SODIUM 200 MG/2ML IV SOLN
INTRAVENOUS | Status: AC
  Filled 2017-08-24: qty 2

## 2017-08-24 MED ORDER — ACETAMINOPHEN 500 MG PO TABS
1000.0000 mg | ORAL_TABLET | ORAL | Status: AC
  Administered 2017-08-24: 1000 mg via ORAL

## 2017-08-24 MED ORDER — PROPOFOL 10 MG/ML IV BOLUS
INTRAVENOUS | Status: DC | PRN
  Administered 2017-08-24: 200 mg via INTRAVENOUS

## 2017-08-24 MED ORDER — PROPOFOL 10 MG/ML IV BOLUS
INTRAVENOUS | Status: AC
  Filled 2017-08-24: qty 20

## 2017-08-24 MED ORDER — HYDROCODONE-ACETAMINOPHEN 5-325 MG PO TABS: 1.0000 | ORAL_TABLET | Freq: Four times a day (QID) | ORAL | 0 refills | Status: DC | PRN

## 2017-08-24 MED ORDER — PHENYLEPHRINE HCL 10 MG/ML IJ SOLN
INTRAMUSCULAR | Status: AC
  Filled 2017-08-24: qty 1

## 2017-08-24 MED ORDER — MIDAZOLAM HCL 2 MG/2ML IJ SOLN
INTRAMUSCULAR | Status: AC
  Filled 2017-08-24: qty 2

## 2017-08-24 MED ORDER — CEFAZOLIN SODIUM-DEXTROSE 2-4 GM/100ML-% IV SOLN
2.0000 g | INTRAVENOUS | Status: AC
  Administered 2017-08-24: 2 g via INTRAVENOUS

## 2017-08-24 MED ORDER — LIDOCAINE HCL (PF) 2 % IJ SOLN
INTRAMUSCULAR | Status: AC
  Filled 2017-08-24: qty 10

## 2017-08-24 MED ORDER — DEXAMETHASONE SODIUM PHOSPHATE 10 MG/ML IJ SOLN
INTRAMUSCULAR | Status: DC | PRN
  Administered 2017-08-24: 5 mg via INTRAVENOUS

## 2017-08-24 MED ORDER — ROCURONIUM BROMIDE 50 MG/5ML IV SOLN
INTRAVENOUS | Status: AC
  Filled 2017-08-24: qty 1

## 2017-08-24 MED ORDER — DEXMEDETOMIDINE HCL 200 MCG/2ML IV SOLN
INTRAVENOUS | Status: DC | PRN
  Administered 2017-08-24: 8 ug via INTRAVENOUS
  Administered 2017-08-24: 12 ug via INTRAVENOUS

## 2017-08-24 MED ORDER — ONDANSETRON HCL 4 MG/2ML IJ SOLN: 4.0000 mg | Freq: Once | INTRAMUSCULAR | Status: DC | PRN

## 2017-08-24 MED ORDER — DEXMEDETOMIDINE HCL IN NACL 80 MCG/20ML IV SOLN
INTRAVENOUS | Status: AC
  Filled 2017-08-24: qty 20

## 2017-08-24 MED ORDER — SODIUM CHLORIDE 0.9 % IJ SOLN
INTRAMUSCULAR | Status: AC
  Filled 2017-08-24: qty 10

## 2017-08-24 MED ORDER — ONDANSETRON HCL 4 MG/2ML IJ SOLN
INTRAMUSCULAR | Status: AC
  Filled 2017-08-24: qty 2

## 2017-08-24 MED ORDER — FAMOTIDINE 20 MG PO TABS
20.0000 mg | ORAL_TABLET | Freq: Once | ORAL | Status: AC
  Administered 2017-08-24: 20 mg via ORAL

## 2017-08-24 MED ORDER — GABAPENTIN 300 MG PO CAPS
300.0000 mg | ORAL_CAPSULE | ORAL | Status: AC
  Administered 2017-08-24: 300 mg via ORAL

## 2017-08-24 MED ORDER — CEFAZOLIN SODIUM-DEXTROSE 2-4 GM/100ML-% IV SOLN
INTRAVENOUS | Status: AC
  Filled 2017-08-24: qty 100

## 2017-08-24 MED ORDER — BUPIVACAINE HCL (PF) 0.25 % IJ SOLN
INTRAMUSCULAR | Status: AC
  Filled 2017-08-24: qty 30

## 2017-08-24 MED ORDER — ONDANSETRON HCL 4 MG/2ML IJ SOLN
INTRAMUSCULAR | Status: DC | PRN
  Administered 2017-08-24: 4 mg via INTRAVENOUS

## 2017-08-24 MED ORDER — ACETAMINOPHEN 500 MG PO TABS
ORAL_TABLET | ORAL | Status: AC
  Administered 2017-08-24: 1000 mg via ORAL
  Filled 2017-08-24: qty 2

## 2017-08-24 SURGICAL SUPPLY — 42 items
BLADE SURG 15 STRL SS SAFETY (BLADE) ×3 IMPLANT
CANISTER SUCT 1200ML W/VALVE (MISCELLANEOUS) ×3 IMPLANT
CHLORAPREP W/TINT 26ML (MISCELLANEOUS) ×3 IMPLANT
CORD BIP STRL DISP 12FT (MISCELLANEOUS) ×3 IMPLANT
COVER TIP SHEARS 8 DVNC (MISCELLANEOUS) ×2 IMPLANT
COVER TIP SHEARS 8MM DA VINCI (MISCELLANEOUS) ×1
DEFOGGER SCOPE WARMER CLEARIFY (MISCELLANEOUS) ×3 IMPLANT
DERMABOND ADVANCED (GAUZE/BANDAGES/DRESSINGS) ×1
DERMABOND ADVANCED .7 DNX12 (GAUZE/BANDAGES/DRESSINGS) ×2 IMPLANT
DRAPE 3 ARM ACCESS DA VINCI (DRAPES) ×1
DRAPE 3 ARM ACCESS DVNC (DRAPES) ×2 IMPLANT
DRAPE SHEET LG 3/4 BI-LAMINATE (DRAPES) ×6 IMPLANT
ELECT REM PT RETURN 9FT ADLT (ELECTROSURGICAL) ×3
ELECTRODE REM PT RTRN 9FT ADLT (ELECTROSURGICAL) ×2 IMPLANT
GLOVE BIO SURGEON STRL SZ7 (GLOVE) ×12 IMPLANT
GOWN STRL REUS W/ TWL LRG LVL3 (GOWN DISPOSABLE) ×8 IMPLANT
GOWN STRL REUS W/TWL LRG LVL3 (GOWN DISPOSABLE) ×4
GRASPER SUT TROCAR 14GX15 (MISCELLANEOUS) IMPLANT
IV NS 1000ML (IV SOLUTION) ×1
IV NS 1000ML BAXH (IV SOLUTION) ×2 IMPLANT
KIT PINK PAD W/HEAD ARE REST (MISCELLANEOUS) ×3
KIT PINK PAD W/HEAD ARM REST (MISCELLANEOUS) ×2 IMPLANT
LABEL OR SOLS (LABEL) ×3 IMPLANT
MESH 3DMAX 4X6 LT LRG (Mesh General) ×3 IMPLANT
NEEDLE HYPO 22GX1.5 SAFETY (NEEDLE) ×3 IMPLANT
PACK LAP CHOLECYSTECTOMY (MISCELLANEOUS) ×3 IMPLANT
PROGRASP ENDOWRIST DA VINCI (INSTRUMENTS) ×1
PROGRASP ENDOWRIST DVNC (INSTRUMENTS) ×2 IMPLANT
SLEEVE ADV FIXATION 5X100MM (TROCAR) IMPLANT
SOLUTION ELECTROLUBE (MISCELLANEOUS) ×3 IMPLANT
SPONGE LAP 18X18 5 PK (GAUZE/BANDAGES/DRESSINGS) ×3 IMPLANT
STRAP SAFETY 5IN WIDE (MISCELLANEOUS) ×3 IMPLANT
SUT MNCRL AB 4-0 PS2 18 (SUTURE) ×3 IMPLANT
SUT VIC AB 2-0 SH 27 (SUTURE) ×2
SUT VIC AB 2-0 SH 27XBRD (SUTURE) ×4 IMPLANT
SUT VICRYL 0 AB UR-6 (SUTURE) ×6 IMPLANT
SUT VLOC 90 2/L VL 12 GS22 (SUTURE) ×3 IMPLANT
SYR 20CC LL (SYRINGE) ×3 IMPLANT
TRAY FOLEY MTR SLVR 16FR STAT (SET/KITS/TRAYS/PACK) IMPLANT
TROCAR BALLN GELPORT 12X130M (ENDOMECHANICALS) ×3 IMPLANT
TROCAR Z-THREAD OPTICAL 5X100M (TROCAR) ×3 IMPLANT
TUBING INSUF HEATED (TUBING) ×3 IMPLANT

## 2017-08-24 NOTE — Op Note (Signed)
Robotic assisted Laparoscopic left Inguinal Hernia Repair  Willie Hess  08/24/2017  Pre-operative Diagnosis: Left Inguinal Hernia  Post-operative Diagnosis: Same  Procedure: Robotic assisted Laparoscopic Transabdominal repair of Left inguinal hernia w Large 3D mesh  Surgeon: Caroleen Hamman, MD FACS  Anesthesia: Gen. with endotracheal tube  Findings: Direct Left  Inguinal hernia      Procedure Details  The patient was seen again in the Holding Room. The benefits, complications, treatment options, and expected outcomes were discussed with the patient. The risks of bleeding, infection, recurrence of symptoms, failure to resolve symptoms, recurrence of hernia, ischemic orchitis, chronic pain syndrome or neuroma, were discussed again. The likelihood of improving the patient's symptoms with return to their baseline status is good.  The patient and/or family concurred with the proposed plan, giving informed consent.  The patient was taken to Operating Room, identified as Willie Hess and the procedure verified as Laparoscopic Inguinal Hernia Repair. Laterality confirmed.  A Time Out was held and the above information confirmed.  Prior to the induction of general anesthesia, antibiotic prophylaxis was administered. VTE prophylaxis was in place. General endotracheal anesthesia was then administered and tolerated well. After the induction, the abdomen was prepped with Chloraprep and draped in the sterile fashion. The patient was positioned in the supine position.  Umbilical incision was created and electrocautery was used to dissect through subcutaneous tissue.  The fascia was elevated between 2 Kocher clamps and incised using a 15 blade knife.  The sutures were placed on each side of the fascia in the standard fashion.  The abdominal cavity was entered under direct visualization and a Hassan trocar was placed in the standard fashion. The peritoneum was obtained and the patient was placed in a  Trendelenburg position.  Laparoscopy revealed evidence of a left inguinal hernia no evidence of an inguinal hernia on the right side.  We placed 2 additional 8 mm ports on each side of the abdominal wall under direct visualization and the robot was brought to the surgical field and docked in the standard fashion.  We made sure we avoided any collision between arms and there was no tension or potential collusion with patient's body. We proceeded to insert the large 3D Bard mesh in the standard fashion. Biotic instrument was inserted and were kept in view at all times.  I scrubbed out and then went to the console.  Peritoneal flap was dissected over the lateral to medial fashion.  Then we dissected the areolar tissue and actually had some bleeding from the epigastric vessel.  We probably control the bleeding with our bipolar dissector in the standard fashion.  There was adequate hemostasis.  Dissection was performed to delineate Cooper's ligament and the lateral extent of dissection was determined . The nerve on the lateral abdominal wall was identified and kept in view at all times. The cord was skeletonized of the direct sac and cord lipoma which was retracted cephalad .   Were able to identify of old structures including the vas deferens as well as the Cooper ligament and symphysis pubis.  The large mesh was placed within the pocket and that was secured with 2 interrupted 2-0 silk's medially.  There was a good apposition of the mesh within the inguinal canal.  We were able to cover the direct space indirect space as well as the femoral space. The arterial flap was closed in the standard fashion with a 2 OV lock suture. Another log revealed no evidence of any injury to  the bowel and excellent repair with no rents within the peritoneum.  Robotic instruments was removed and the robot was undocked in the standard fashion.  The pneumoperitoneum was released.  Fascia was closed with interrupted 0 Vicryl  sutures and the skin was closed with a 4-0 Monocryl in a sub-particular fashion.  Marcaine quarter percent with epinephrine was injected at all incision sites.  Dermabond was used to coat the incisions  Patient tolerated the procedure well. There were no complications. He was taken to the recovery room in stable condition.               Caroleen Hamman, MD, FACS

## 2017-08-24 NOTE — Anesthesia Post-op Follow-up Note (Signed)
Anesthesia QCDR form completed.        

## 2017-08-24 NOTE — Anesthesia Procedure Notes (Deleted)
Procedure Name: Intubation Date/Time: 08/24/2017 8:16 AM Performed by: Wess Botts, RN Pre-anesthesia Checklist: Patient identified, Suction available, Patient being monitored, Timeout performed and Emergency Drugs available Patient Re-evaluated:Patient Re-evaluated prior to induction Oxygen Delivery Method: Circle system utilized Preoxygenation: Pre-oxygenation with 100% oxygen Induction Type: IV induction Laryngoscope Size: Mac and 4 Grade View: Grade I Tube type: Oral Tube size: 7.5 mm Number of attempts: 1 Airway Equipment and Method: Stylet Placement Confirmation: ETT inserted through vocal cords under direct vision,  positive ETCO2,  CO2 detector and breath sounds checked- equal and bilateral Secured at: 22 cm Tube secured with: Tape Dental Injury: Teeth and Oropharynx as per pre-operative assessment

## 2017-08-24 NOTE — Anesthesia Postprocedure Evaluation (Signed)
Anesthesia Post Note  Patient: Willie Hess  Procedure(s) Performed: ROBOT ASSISTED INGUINAL HERNIA REPAIR (Left ) INSERTION OF MESH (Left Inguinal)  Patient location during evaluation: PACU Anesthesia Type: General Level of consciousness: awake and alert Pain management: pain level controlled Vital Signs Assessment: post-procedure vital signs reviewed and stable Respiratory status: spontaneous breathing and respiratory function stable Cardiovascular status: stable Anesthetic complications: no     Last Vitals:  Vitals:   08/24/17 1053 08/24/17 1129  BP: (!) 150/88 (!) 141/80  Pulse: 75 79  Resp: 16 16  Temp: 36.7 C 36.6 C  SpO2: 97% 100%    Last Pain:  Vitals:   08/24/17 1129  TempSrc: Temporal  PainSc: 3                  KEPHART,WILLIAM K

## 2017-08-24 NOTE — Addendum Note (Signed)
Addendum  created 08/24/17 1320 by Rolla Plate, CRNA   Intraprocedure LDAs edited, LDA properties accepted

## 2017-08-24 NOTE — OR Nursing (Signed)
To postop with fluids infusing to right arm site.  2nd site left arm saline locked (was not documented in epic).  Both sites d/c'd postop.

## 2017-08-24 NOTE — Transfer of Care (Signed)
Immediate Anesthesia Transfer of Care Note  Patient: Willie Hess  Procedure(s) Performed: ROBOT ASSISTED INGUINAL HERNIA REPAIR (Left ) INSERTION OF MESH (Left Inguinal)  Patient Location: PACU  Anesthesia Type:General  Level of Consciousness: awake and sedated  Airway & Oxygen Therapy: Patient Spontanous Breathing and Patient connected to face mask oxygen  Post-op Assessment: Report given to RN and Post -op Vital signs reviewed and stable  Post vital signs: Reviewed  Last Vitals:  Vitals Value Taken Time  BP 131/87 08/24/2017 10:14 AM  Temp 36.2 C 08/24/2017 10:14 AM  Pulse 80 08/24/2017 10:14 AM  Resp 16 08/24/2017 10:14 AM  SpO2 100 % 08/24/2017 10:14 AM  Vitals shown include unvalidated device data.  Last Pain:  Vitals:   08/24/17 0610  TempSrc: Tympanic  PainSc: 3          Complications: No apparent anesthesia complications

## 2017-08-24 NOTE — Interval H&P Note (Signed)
History and Physical Interval Note:  08/24/2017 7:18 AM  Willie Hess  has presented today for surgery, with the diagnosis of inguinal hernia  The various methods of treatment have been discussed with the patient and family. After consideration of risks, benefits and other options for treatment, the patient has consented to  Procedure(s): [place] (Left) as a surgical intervention .  The patient's history has been reviewed, patient examined, no change in status, stable for surgery.  I have reviewed the patient's chart and labs.  Questions were answered to the patient's satisfaction.     [place]

## 2017-08-24 NOTE — Discharge Instructions (Addendum)
Laparoscopic Inguinal Hernia Repair, Adult, Care After °This sheet gives you information about how to care for yourself after your procedure. Your health care provider may also give you more specific instructions. If you have problems or questions, contact your health care provider. °What can I expect after the procedure? °After the procedure, it is common to have: °· Pain. °· Swelling and bruising around the incision area. °· Scrotal swelling, in men. °· Some fluid or blood draining from your incisions. °  °Follow these instructions at home: °Incision care °· Follow instructions from your health care provider about how to take care of your incisions. Make sure you: °? Wash your hands with soap and water before you change your bandage (dressing). If soap and water are not available, use hand sanitizer. °? Change your dressing as told by your health care provider. °? Leave stitches (sutures), skin glue, or adhesive strips in place. These skin closures may need to stay in place for 2 weeks or longer. If adhesive strip edges start to loosen and curl up, you may trim the loose edges. Do not remove adhesive strips completely unless your health care provider tells you to do that. °· Check your incision area every day for signs of infection. Check for: °? More redness, swelling, or pain. °? More fluid or blood. °? Warmth. °? Pus or a bad smell. °· Wear loose, soft clothing while your incisions heal. °Driving °· Do not drive or use heavy machinery while taking prescription pain medicine. °· Do not drive for 24 hours if you were given a medicine to help you relax (sedative) during your procedure. °Activity °· Do not lift anything that is heavier than 10 lb (4.5 kg), or the limit that you are told, until your health care provider says that it is safe. °· Ask your health care provider what activities are safe for you. A lot of activity during the first week after surgery can increase pain and swelling. For 1 week after your  procedure: °? Avoid activities that take a lot of effort, such as exercise or sports. °? You may walk and climb stairs as needed for daily activity, but avoid long walks or climbing stairs for exercise. °Managing pain and swelling °·  °· Put ice on painful or swollen areas: °? Put ice in a plastic bag. °? Place a towel between your skin and the bag. °? Leave the ice on for 20 minutes, 2-3 times a day. °General instructions °· Do not take baths, swim, or use a hot tub until your health care provider approves. Ask your health care provider if you may take showers. You may only be allowed to take sponge baths. °· Take over-the-counter and prescription medicines only as told by your health care provider. °· To prevent or treat constipation while you are taking prescription pain medicine, your health care provider may recommend that you: °? Drink enough fluid to keep your urine pale yellow. °? Take over-the-counter or prescription medicines. °? Eat foods that are high in fiber, such as fresh fruits and vegetables, whole grains, and beans. °? Limit foods that are high in fat and processed sugars, such as fried and sweet foods. °· Do not use any products that contain nicotine or tobacco, such as cigarettes and e-cigarettes. If you need help quitting, ask your health care provider. °· Drink enough fluid to keep your urine pale yellow. °· Keep all follow-up visits as told by your health care provider. This is important. °Contact a health care provider if: °·   You have more redness, swelling, or pain around your incisions or your groin area. °· You have more swelling in your scrotum. °· You have more fluid or blood coming from your incisions. °· Your incisions feel warm to the touch. °· You have severe pain and medicines do not help. °· You have abdominal pain or swelling. °· You cannot eat or drink without vomiting. °· You cannot urinate or pass a bowel movement. °· You faint. °· You feel dizzy. °· You have nausea and  vomiting. °· You have a fever. °Get help right away if: °· You have pus or a bad smell coming from your incisions. °· You have chest pain. °· You have problems breathing. °Summary °· Pain, swelling, and bruising are common after the procedure. °· Check your incision area every day for signs of infection, such as more redness, swelling, or pain. °· Put ice on painful or swollen areas for 20 minutes, 2-3 times a day. °This information is not intended to replace advice given to you by your health care provider. Make sure you discuss any questions you have with your health care provider. °Document Released: 05/12/2016 Document Revised: 05/12/2016 Document Reviewed: 05/12/2016 °Elsevier Interactive Patient Education © 2018 [employer] °  ° °AMBULATORY SURGERY  °DISCHARGE INSTRUCTIONS ° ° °1) The drugs that you were given will stay in your system until tomorrow so for the next 24 hours you should not: ° °A) Drive an automobile °B) Make any legal decisions °C) Drink any alcoholic beverage ° ° °2) You may resume regular meals tomorrow.  Today it is better to start with liquids and gradually work up to solid foods. ° °You may eat anything you prefer, but it is better to start with liquids, then soup and crackers, and gradually work up to solid foods. ° ° °3) Please notify your doctor immediately if you have any unusual bleeding, trouble breathing, redness and pain at the surgery site, drainage, fever, or pain not relieved by medication. ° ° ° °4) Additional Instructions: ° ° ° ° ° ° ° °Please contact your physician with any problems or Same Day Surgery at [phone], Monday through Friday 6 am to 4 pm, or [place] [facility] number at [phone]. ° °

## 2017-08-24 NOTE — Anesthesia Procedure Notes (Signed)
Procedure Name: Intubation Date/Time: 08/24/2017 8:16 AM Performed by: Wess Botts, RN Pre-anesthesia Checklist: Patient identified, Patient being monitored, Timeout performed, Emergency Drugs available and Suction available Patient Re-evaluated:Patient Re-evaluated prior to induction Oxygen Delivery Method: Circle system utilized Preoxygenation: Pre-oxygenation with 100% oxygen Induction Type: IV induction Ventilation: Mask ventilation without difficulty Laryngoscope Size: Mac and 4 Grade View: Grade I Tube type: Oral Tube size: 7.5 mm Number of attempts: 1 Airway Equipment and Method: Stylet Placement Confirmation: ETT inserted through vocal cords under direct vision,  positive ETCO2 and breath sounds checked- equal and bilateral Secured at: 22 cm Tube secured with: Tape Dental Injury: Teeth and Oropharynx as per pre-operative assessment

## 2017-08-24 NOTE — Anesthesia Preprocedure Evaluation (Signed)
Anesthesia Evaluation  Patient identified by MRN, date of birth, ID band Patient awake    Reviewed: Allergy & Precautions, NPO status , Patient's Chart, lab work & pertinent test results  History of Anesthesia Complications Negative for: history of anesthetic complications  Airway Mallampati: III       Dental   Pulmonary neg sleep apnea, neg COPD, former smoker (quit 1 month),           Cardiovascular (-) hypertension(-) Past MI and (-) CHF (-) dysrhythmias (-) Valvular Problems/Murmurs     Neuro/Psych neg Seizures    GI/Hepatic Neg liver ROS, GERD  Medicated and Controlled,  Endo/Other  neg diabetes  Renal/GU Renal disease (stones)     Musculoskeletal   Abdominal   Peds  Hematology   Anesthesia Other Findings   Reproductive/Obstetrics                             Anesthesia Physical Anesthesia Plan  ASA: II  Anesthesia Plan: General   Post-op Pain Management:    Induction: Intravenous  PONV Risk Score and Plan:   Airway Management Planned: Oral ETT  Additional Equipment:   Intra-op Plan:   Post-operative Plan:   Informed Consent: I have reviewed the patients History and Physical, chart, labs and discussed the procedure including the risks, benefits and alternatives for the proposed anesthesia with the patient or authorized representative who has indicated his/her understanding and acceptance.     Plan Discussed with:   Anesthesia Plan Comments:         Anesthesia Quick Evaluation

## 2017-09-04 ENCOUNTER — Encounter: Payer: Self-pay | Admitting: Surgery

## 2017-09-04 ENCOUNTER — Ambulatory Visit (INDEPENDENT_AMBULATORY_CARE_PROVIDER_SITE_OTHER): Payer: Managed Care, Other (non HMO) | Admitting: Surgery

## 2017-09-04 VITALS — BP 142/79 | HR 90 | Resp 13 | Ht 72.0 in | Wt 193.0 lb

## 2017-09-04 DIAGNOSIS — K4090 Unilateral inguinal hernia, without obstruction or gangrene, not specified as recurrent: Secondary | ICD-10-CM

## 2017-09-04 NOTE — Progress Notes (Signed)
S/p Robotic Left IH repair Doing well Some mild pain on the left inguinal area No fevers or chills, taking PO   PE NAD Abd: soft, incisions c/d/i, no infection , recurrence or peritonitis  A/P Doing well D/W him  About some usual post op soreness No heavy lifting RTC prn

## 2017-09-04 NOTE — Patient Instructions (Signed)

## 2017-09-20 ENCOUNTER — Encounter: Payer: Self-pay | Admitting: Family Medicine

## 2017-09-20 ENCOUNTER — Ambulatory Visit (INDEPENDENT_AMBULATORY_CARE_PROVIDER_SITE_OTHER): Payer: Managed Care, Other (non HMO) | Admitting: Family Medicine

## 2017-09-20 VITALS — BP 124/68 | HR 87 | Temp 98.1°F | Resp 12 | Ht 74.38 in | Wt 193.3 lb

## 2017-09-20 DIAGNOSIS — Z87891 Personal history of nicotine dependence: Secondary | ICD-10-CM | POA: Diagnosis not present

## 2017-09-20 DIAGNOSIS — Z789 Other specified health status: Secondary | ICD-10-CM | POA: Diagnosis not present

## 2017-09-20 DIAGNOSIS — Z0000 Encounter for general adult medical examination without abnormal findings: Secondary | ICD-10-CM

## 2017-09-20 NOTE — Progress Notes (Signed)
Patient ID: Willie Hess, male   DOB: July 26, 1965, 52 y.o.   MRN: [id number]   Subjective:   Willie Hess is a 52 y.o. male here for a complete physical exam  Interim issues since last visit: herniorrhaphy, Dr. Dahlia Byes  USPSTF grade A and B recommendations Depression:  Depression screen [facility] 2/9 09/20/2017 07/27/2017 10/11/2016 08/25/2016 12/24/2015  Decreased Interest 0 0 0 0 0  Down, Depressed, Hopeless 0 0 0 0 0  PHQ - 2 Score 0 0 0 0 0   Hypertension: not much salt BP Readings from Last 3 Encounters:  09/20/17 124/68  09/04/17 (!) 142/79  08/24/17 (!) 141/80   Obesity: Wt Readings from Last 3 Encounters:  09/20/17 193 lb 4.8 oz (87.7 kg)  09/04/17 193 lb (87.5 kg)  08/24/17 189 lb 12.8 oz (86.1 kg)   BMI Readings from Last 3 Encounters:  09/20/17 24.57 kg/m  09/04/17 26.18 kg/m  08/24/17 25.74 kg/m    Immunizations: might be interested in the shingles vaccine, will think about it Skin cancer: no worrisome moles Lung cancer:  Quit smoking 3 months ago, battle, hard to quit, made it for a few months Prostate cancer: start at age 23 No results found for: PSA Colorectal cancer: next 2022 AAA: n/a Aspirin: n/a Diet: pretty good eater, some fast food, cooks supper at home most nights, meat and veggies Exercise: working and walking Alcohol: no Tobacco use: battle described above HIV, hep B, hep C: not interested STD testing and prevention (chl/gon/syphilis): not interested Glucose:  Glucose  Date Value Ref Range Status  08/25/2016 61 (L) 65 - 99 mg/dL Final  10/12/2015 82 65 - 99 mg/dL Final   Glucose, Bld  Date Value Ref Range Status  09/16/2014 132 (H) 65 - 99 mg/dL Final  09/14/2014 146 (H) 65 - 99 mg/dL Final   Lipids:  Lab Results  Component Value Date   CHOL 200 (H) 08/25/2016   CHOL 198 10/12/2015   Lab Results  Component Value Date   HDL 43 08/25/2016   HDL 51 10/12/2015   Lab Results  Component Value Date   LDLCALC 140 (H) 08/25/2016   LDLCALC  131 (H) 10/12/2015   Lab Results  Component Value Date   TRIG 86 08/25/2016   TRIG 81 10/12/2015   Lab Results  Component Value Date   CHOLHDL 4.7 08/25/2016   CHOLHDL 3.9 10/12/2015   No results found for: LDLDIRECT   Past Medical History:  Diagnosis Date  . Arthritis   . Benign neoplasm of ascending colon   . Benign neoplasm of cecum   . Benign neoplasm of sigmoid colon   . Benign neoplasm of transverse colon   . Decreased creatinine clearance 12/11/2014  . 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 GERD (gastroesophageal reflux disease)   . GERD without esophagitis 12/11/2014  . Headache    migraines 1x/6 mos (lesser HAs more often)  . History of kidney stones    h/o  . Hx of smoking 07/27/2017  . Preventative health care 10/12/2015   Past Surgical History:  Procedure Laterality Date  . COLONOSCOPY WITH PROPOFOL N/A 11/12/2015   Procedure: COLONOSCOPY WITH PROPOFOL;  Surgeon: Lucilla Lame, MD;  Location: [place];  Service: Endoscopy;  Laterality: N/A;  . ESOPHAGOGASTRODUODENOSCOPY    . INSERTION OF MESH Left 08/24/2017   Procedure: INSERTION OF MESH;  Surgeon: Jules Husbands, MD;  Location: [facility];  Service: General;  Laterality: Left;  . POLYPECTOMY  11/12/2015  Procedure: POLYPECTOMY;  Surgeon: Lucilla Lame, MD;  Location: [place];  Service: Endoscopy;;  . ROBOT ASSISTED INGUINAL HERNIA REPAIR Left 08/24/2017   Procedure: ROBOT ASSISTED INGUINAL HERNIA REPAIR;  Surgeon: Jules Husbands, MD;  Location: [facility];  Service: General;  Laterality: Left;  . URETEROSCOPY WITH HOLMIUM LASER LITHOTRIPSY Left 09/23/2014   Procedure: URETEROSCOPY, CYSTOSCOPY WITH STENT PLACEMENT, HOLMIUM STAND BY ;  Surgeon: Royston Cowper, MD;  Location: [facility];  Service: Urology;  Laterality: Left;  Marland Kitchen VASECTOMY    . WISDOM TOOTH EXTRACTION     Family History  Problem Relation Age of Onset  . Thyroid disease Mother   . Heart disease Father   . Heart disease Sister   . Heart disease  Paternal Grandmother   . Emphysema Paternal Grandfather    Social History   Tobacco Use  . Smoking status: Former Smoker    Packs/day: 1.00    Years: 30.00    Pack years: 30.00    Types: Cigarettes    Last attempt to quit: 08/01/2017    Years since quitting: 0.1  . Smokeless tobacco: Never Used  Substance Use Topics  . Alcohol use: No    Frequency: Never  . Drug use: No   Review of Systems  Constitutional: Negative for fever.  HENT: Negative for hearing loss.   Eyes: Negative for visual disturbance.  Respiratory: Negative for shortness of breath.   Cardiovascular: Negative for chest pain.  Gastrointestinal: Negative for blood in stool.  Genitourinary: Negative for hematuria.  Skin:       Nothing worrisome  Allergic/Immunologic: Positive for food allergies (raw peanuts; can eat cooked peanuts all day long, no problems).  Neurological: Negative for tremors.  Hematological: Does not bruise/bleed easily.    Objective:   Vitals:   09/20/17 0807  BP: 124/68  Pulse: 87  Resp: 12  Temp: 98.1 F (36.7 C)  TempSrc: Oral  SpO2: 98%  Weight: 193 lb 4.8 oz (87.7 kg)  Height: 6' 2.38" (1.889 m)   Body mass index is 24.57 kg/m. Wt Readings from Last 3 Encounters:  09/20/17 193 lb 4.8 oz (87.7 kg)  09/04/17 193 lb (87.5 kg)  08/24/17 189 lb 12.8 oz (86.1 kg)   Physical Exam  Constitutional: He appears well-developed and well-nourished. No distress.  HENT:  Head: Normocephalic and atraumatic.  Nose: Nose normal.  Mouth/Throat: Oropharynx is clear and moist.  Eyes: EOM are normal. No scleral icterus.  Neck: No JVD present. No thyromegaly present.  Cardiovascular: Normal rate, regular rhythm and normal heart sounds.  Pulmonary/Chest: Effort normal and breath sounds normal. No respiratory distress. He has no wheezes. He has no rales.  Abdominal: Soft. Bowel sounds are normal. He exhibits no distension. There is no tenderness. There is no guarding.  Musculoskeletal: Normal  range of motion. He exhibits no edema.  Lymphadenopathy:    He has no cervical adenopathy.  Neurological: He is alert. He displays normal reflexes. He exhibits normal muscle tone. Coordination normal.  Skin: Skin is warm and dry. No rash noted. He is not diaphoretic. No erythema. No pallor.  Psychiatric: He has a normal mood and affect. His behavior is normal. Judgment and thought content normal.    Assessment/Plan:   Problem List Items Addressed This Visit      Other   Preventative health care - Primary    USPSTF grade A and B recommendations reviewed with patient; age-appropriate recommendations, preventive care, screening tests, etc discussed and encouraged; healthy living  encouraged; see AVS for patient education given to patient       Relevant Orders   CBC with Differential/Platelet   Comprehensive metabolic panel   Lipid panel   Hemoglobin A1c   TSH   Hx of smoking    Praise given for quitting; stay in the battle, keep fighting to not pick back up       Other Visit Diagnoses    Measles, mumps, rubella (MMR) vaccination status unknown       Relevant Orders   Measles/Mumps/Rubella Immunity      No orders of the defined types were placed in this encounter.  Orders Placed This Encounter  Procedures  . Measles/Mumps/Rubella Immunity  . CBC with Differential/Platelet  . Comprehensive metabolic panel    Order Specific Question:   Has the patient fasted?    Answer:   Yes  . Lipid panel    Order Specific Question:   Has the patient fasted?    Answer:   Yes  . Hemoglobin A1c  . TSH    Follow up plan: Return in about 1 year (around 09/21/2018) for complete physical.  An After Visit Summary was printed and given to the patient.

## 2017-09-20 NOTE — Patient Instructions (Addendum)
Health Maintenance, Male A healthy lifestyle and preventive care is important for your health and wellness. Ask your health care provider about what schedule of regular examinations is right for you. What should I know about weight and diet? Eat a Healthy Diet  Eat plenty of vegetables, fruits, whole grains, low-fat dairy products, and lean protein.  Do not eat a lot of foods high in solid fats, added sugars, or salt.  Maintain a Healthy Weight Regular exercise can help you achieve or maintain a healthy weight. You should:  Do at least 150 minutes of exercise each week. The exercise should increase your heart rate and make you sweat (moderate-intensity exercise).  Do strength-training exercises at least twice a week.  Watch Your Levels of Cholesterol and Blood Lipids  Have your blood tested for lipids and cholesterol every 5 years starting at 52 years of age. If you are at high risk for heart disease, you should start having your blood tested when you are 52 years old. You may need to have your cholesterol levels checked more often if: ? Your lipid or cholesterol levels are high. ? You are older than 52 years of age. ? You are at high risk for heart disease.  What should I know about cancer screening? Many types of cancers can be detected early and may often be prevented. Lung Cancer  You should be screened every year for lung cancer if: ? You are a current smoker who has smoked for at least 30 years. ? You are a former smoker who has quit within the past 15 years.  Talk to your health care provider about your screening options, when you should start screening, and how often you should be screened.  Colorectal Cancer  Routine colorectal cancer screening usually begins at 52 years of age and should be repeated every 5-10 years until you are 52 years old. You may need to be screened more often if early forms of precancerous polyps or small growths are found. Your health care  provider may recommend screening at an earlier age if you have risk factors for colon cancer.  Your health care provider may recommend using home test kits to check for hidden blood in the stool.  A small camera at the end of a tube can be used to examine your colon (sigmoidoscopy or colonoscopy). This checks for the earliest forms of colorectal cancer.  Prostate and Testicular Cancer  Depending on your age and overall health, your health care provider may do certain tests to screen for prostate and testicular cancer.  Talk to your health care provider about any symptoms or concerns you have about testicular or prostate cancer.  Skin Cancer  Check your skin from head to toe regularly.  Tell your health care provider about any new moles or changes in moles, especially if: ? There is a change in a mole's size, shape, or color. ? You have a mole that is larger than a pencil eraser.  Always use sunscreen. Apply sunscreen liberally and repeat throughout the day.  Protect yourself by wearing long sleeves, pants, a wide-brimmed hat, and sunglasses when outside.  What should I know about heart disease, diabetes, and high blood pressure?  If you are 44-22 years of age, have your blood pressure checked every 3-5 years. If you are 63 years of age or older, have your blood pressure checked every year. You should have your blood pressure measured twice-once when you are at a hospital or clinic, and once  when you are not at a hospital or clinic. Record the average of the two measurements. To check your blood pressure when you are not at a hospital or clinic, you can use: ? An automated blood pressure machine at a pharmacy. ? A home blood pressure monitor.  Talk to your health care provider about your target blood pressure.  If you are between 29-23 years old, ask your health care provider if you should take aspirin to prevent heart disease.  Have regular diabetes screenings by checking your  fasting blood sugar level. ? If you are at a normal weight and have a low risk for diabetes, have this test once every three years after the age of 62. ? If you are overweight and have a high risk for diabetes, consider being tested at a younger age or more often.  A one-time screening for abdominal aortic aneurysm (AAA) by ultrasound is recommended for men aged 25-75 years who are current or former smokers. What should I know about preventing infection? Hepatitis B If you have a higher risk for hepatitis B, you should be screened for this virus. Talk with your health care provider to find out if you are at risk for hepatitis B infection. Hepatitis C Blood testing is recommended for:  Everyone born from [birth year] through 1965.  Anyone with known risk factors for hepatitis C.  Sexually Transmitted Diseases (STDs)  You should be screened each year for STDs including gonorrhea and chlamydia if: ? You are sexually active and are younger than 52 years of age. ? You are older than 52 years of age and your health care provider tells you that you are at risk for this type of infection. ? Your sexual activity has changed since you were last screened and you are at an increased risk for chlamydia or gonorrhea. Ask your health care provider if you are at risk.  Talk with your health care provider about whether you are at high risk of being infected with HIV. Your health care provider may recommend a prescription medicine to help prevent HIV infection.  What else can I do?  Schedule regular health, dental, and eye exams.  Stay current with your vaccines (immunizations).  Do not use any tobacco products, such as cigarettes, chewing tobacco, and e-cigarettes. If you need help quitting, ask your health care provider.  Limit alcohol intake to no more than 2 drinks per day. One drink equals 12 ounces of beer, 5 ounces of wine, or 1 ounces of hard liquor.  Do not use street drugs.  Do not share  needles.  Ask your health care provider for help if you need support or information about quitting drugs.  Tell your health care provider if you often feel depressed.  Tell your health care provider if you have ever been abused or do not feel safe at home. This information is not intended to replace advice given to you by your health care provider. Make sure you discuss any questions you have with your health care provider. Document Released: 07/30/2007 Document Revised: 09/30/2015 Document Reviewed: 11/04/2014 Elsevier Interactive Patient Education  2018 [place] Zoster (Shingles) Vaccine, RZV: What You Need to Know 1. Why get vaccinated? Shingles (also called herpes zoster, or just zoster) is a painful skin rash, often with blisters. Shingles is caused by the varicella zoster virus, the same virus that causes chickenpox. After you have chickenpox, the virus stays in your body and can cause shingles later in life. You can't catch  shingles from another person. However, a person who has never had chickenpox (or chickenpox vaccine) could get chickenpox from someone with shingles. A shingles rash usually appears on one side of the face or body and heals within 2 to 4 weeks. Its main symptom is pain, which can be severe. Other symptoms can include fever, headache, chills and upset stomach. Very rarely, a shingles infection can lead to pneumonia, hearing problems, blindness, brain inflammation (encephalitis), or death. For about 1 person in 5, severe pain can continue even long after the rash has cleared up. This long-lasting pain is called post-herpetic neuralgia (PHN). Shingles is far more common in people 5 years of age and older than in younger people, and the risk increases with age. It is also more common in people whose immune system is weakened because of a disease such as cancer, or by drugs such as steroids or chemotherapy. At least 1 million people a year in the [place]  States get shingles. 2. Shingles vaccine (recombinant) Recombinant shingles vaccine was approved by FDA in 2017 for the prevention of shingles. In clinical trials, it was more than 90% effective in preventing shingles. It can also reduce the likelihood of PHN. Two doses, 2 to 6 months apart, are recommended for adults 3 and older. This vaccine is also recommended for people who have already gotten the live shingles vaccine (Zostavax). There is no live virus in this vaccine. 3. Some people should not get this vaccine Tell your vaccine provider if you:  Have any severe, life-threatening allergies. A person who has ever had a life-threatening allergic reaction after a dose of recombinant shingles vaccine, or has a severe allergy to any component of this vaccine, may be advised not to be vaccinated. Ask your health care provider if you want information about vaccine components.  Are pregnant or breastfeeding. There is not much information about use of recombinant shingles vaccine in pregnant or nursing women. Your healthcare provider might recommend delaying vaccination.  Are not feeling well. If you have a mild illness, such as a cold, you can probably get the vaccine today. If you are moderately or severely ill, you should probably wait until you recover. Your doctor can advise you.  4. Risks of a vaccine reaction With any medicine, including vaccines, there is a chance of reactions. After recombinant shingles vaccination, a person might experience:  Pain, redness, soreness, or swelling at the site of the injection  Headache, muscle aches, fever, shivering, fatigue  In clinical trials, most people got a sore arm with mild or moderate pain after vaccination, and some also had redness and swelling where they got the shot. Some people felt tired, had muscle pain, a headache, shivering, fever, stomach pain, or nausea. About 1 out of 6 people who got recombinant zoster vaccine experienced side  effects that prevented them from doing regular activities. Symptoms went away on their own in about 2 to 3 days. Side effects were more common in younger people. You should still get the second dose of recombinant zoster vaccine even if you had one of these reactions after the first dose. Other things that could happen after this vaccine:  People sometimes faint after medical procedures, including vaccination. Sitting or lying down for about 15 minutes can help prevent fainting and injuries caused by a fall. Tell your provider if you feel dizzy or have vision changes or ringing in the ears.  Some people get shoulder pain that can be more severe and longer-lasting than routine  soreness that can follow injections. This happens very rarely.  Any medication can cause a severe allergic reaction. Such reactions to a vaccine are estimated at about 1 in a million doses, and would happen within a few minutes to a few hours after the vaccination. As with any medicine, there is a very remote chance of a vaccine causing a serious injury or death. The safety of vaccines is always being monitored. For more information, visit: http://www.aguilar.org/ 5. What if there is a serious problem? What should I look for?  Look for anything that concerns you, such as signs of a severe allergic reaction, very high fever, or unusual behavior. Signs of a severe allergic reaction can include hives, swelling of the face and throat, difficulty breathing, a fast heartbeat, dizziness, and weakness. These would usually start a few minutes to a few hours after the vaccination. What should I do?  If you think it is a severe allergic reaction or other emergency that can't wait, call 9-1-1 and get to the nearest hospital. Otherwise, call your health care provider. Afterward, the reaction should be reported to the Vaccine Adverse Event Reporting System (VAERS). Your doctor should file this report, or you can do it yourself through  the VAERS web site atwww.vaers.https://www.bray.com/ by calling 301-325-9464. VAERS does not give medical advice. 6. How can I learn more?  Ask your healthcare provider. He or she can give you the vaccine package insert or suggest other sources of information.  Call your local or state health department.  Contact the Centers for Disease Control and Prevention (CDC): ? Call (385) 466-5742 (1-800-CDC-INFO) or ? Visit the CDC's website at http://hunter.com/ CDC Vaccine Information Statement (VIS) Recombinant Zoster Vaccine (03/28/2016) This information is not intended to replace advice given to you by your health care provider. Make sure you discuss any questions you have with your health care provider. Document Released: 04/12/2016 Document Revised: 04/12/2016 Document Reviewed: 04/12/2016 Elsevier Interactive Patient Education  [employer].

## 2017-09-20 NOTE — Assessment & Plan Note (Signed)
Praise given for quitting; stay in the battle, keep fighting to not pick back up

## 2017-09-20 NOTE — Assessment & Plan Note (Signed)
USPSTF grade A and B recommendations reviewed with patient; age-appropriate recommendations, preventive care, screening tests, etc discussed and encouraged; healthy living encouraged; see AVS for patient education given to patient  

## 2017-09-21 LAB — COMPREHENSIVE METABOLIC PANEL
A/G RATIO: 1.6 (ref 1.2–2.2)
ALK PHOS: 68 IU/L (ref 39–117)
ALT: 19 IU/L (ref 0–44)
AST: 16 IU/L (ref 0–40)
Albumin: 4.1 g/dL (ref 3.5–5.5)
BILIRUBIN TOTAL: 0.3 mg/dL (ref 0.0–1.2)
BUN / CREAT RATIO: 11 (ref 9–20)
BUN: 12 mg/dL (ref 6–24)
CHLORIDE: 103 mmol/L (ref 96–106)
CO2: 25 mmol/L (ref 20–29)
CREATININE: 1.06 mg/dL (ref 0.76–1.27)
Calcium: 9.3 mg/dL (ref 8.7–10.2)
GFR calc Af Amer: 93 mL/min/{1.73_m2} (ref >59)
GFR calc non Af Amer: 80 mL/min/{1.73_m2} (ref >59)
GLOBULIN, TOTAL: 2.6 g/dL (ref 1.5–4.5)
Glucose: 86 mg/dL (ref 65–99)
POTASSIUM: 4.6 mmol/L (ref 3.5–5.2)
SODIUM: 139 mmol/L (ref 134–144)
Total Protein: 6.7 g/dL (ref 6.0–8.5)

## 2017-09-21 LAB — LIPID PANEL
CHOLESTEROL TOTAL: 186 mg/dL (ref 100–199)
Chol/HDL Ratio: 4 ratio (ref 0.0–5.0)
HDL: 46 mg/dL (ref >39)
LDL Calculated: 125 mg/dL — ABNORMAL HIGH (ref 0–99)
TRIGLYCERIDES: 76 mg/dL (ref 0–149)
VLDL Cholesterol Cal: 15 mg/dL (ref 5–40)

## 2017-09-21 LAB — CBC WITH DIFFERENTIAL/PLATELET
Basophils Absolute: 0.1 10*3/uL (ref 0.0–0.2)
Basos: 1 %
EOS (ABSOLUTE): 0.1 10*3/uL (ref 0.0–0.4)
EOS: 1 %
HEMATOCRIT: 48 % (ref 37.5–51.0)
Hemoglobin: 15.8 g/dL (ref 13.0–17.7)
Immature Grans (Abs): 0 10*3/uL (ref 0.0–0.1)
Immature Granulocytes: 0 %
LYMPHS ABS: 2.6 10*3/uL (ref 0.7–3.1)
Lymphs: 35 %
MCH: 30.4 pg (ref 26.6–33.0)
MCHC: 32.9 g/dL (ref 31.5–35.7)
MCV: 92 fL (ref 79–97)
Monocytes Absolute: 0.3 10*3/uL (ref 0.1–0.9)
Monocytes: 4 %
NEUTROS ABS: 4.3 10*3/uL (ref 1.4–7.0)
Neutrophils: 59 %
Platelets: 289 10*3/uL (ref 150–450)
RBC: 5.2 x10E6/uL (ref 4.14–5.80)
RDW: 14.1 % (ref 12.3–15.4)
WBC: 7.3 10*3/uL (ref 3.4–10.8)

## 2017-09-21 LAB — HEMOGLOBIN A1C
Est. average glucose Bld gHb Est-mCnc: 120 mg/dL
Hgb A1c MFr Bld: 5.8 % — ABNORMAL HIGH (ref 4.8–5.6)

## 2017-09-21 LAB — MEASLES/MUMPS/RUBELLA IMMUNITY
MUMPS ABS, IGG: 99.5 [AU]/ml (ref >10.9)
RUBEOLA AB, IGG: >300 AU/mL (ref >29.9)

## 2017-09-21 LAB — TSH: TSH: 2.69 u[IU]/mL (ref 0.450–4.500)

## 2017-09-25 ENCOUNTER — Encounter: Payer: Self-pay | Admitting: Family Medicine

## 2017-09-25 DIAGNOSIS — R7303 Prediabetes: Secondary | ICD-10-CM | POA: Insufficient documentation

## 2017-09-25 DIAGNOSIS — Z23 Encounter for immunization: Secondary | ICD-10-CM | POA: Insufficient documentation

## 2017-09-25 HISTORY — DX: Prediabetes: R73.03

## 2018-07-03 ENCOUNTER — Encounter: Payer: Self-pay | Admitting: Nurse Practitioner

## 2018-07-03 ENCOUNTER — Ambulatory Visit: Payer: Managed Care, Other (non HMO) | Admitting: Nurse Practitioner

## 2018-07-03 ENCOUNTER — Other Ambulatory Visit: Payer: Self-pay

## 2018-07-03 VITALS — BP 126/74 | HR 88 | Temp 98.2°F | Resp 16 | Ht 74.38 in | Wt 196.2 lb

## 2018-07-03 DIAGNOSIS — R7303 Prediabetes: Secondary | ICD-10-CM

## 2018-07-03 DIAGNOSIS — A6920 Lyme disease, unspecified: Secondary | ICD-10-CM

## 2018-07-03 DIAGNOSIS — K219 Gastro-esophageal reflux disease without esophagitis: Secondary | ICD-10-CM | POA: Diagnosis not present

## 2018-07-03 MED ORDER — DOXYCYCLINE HYCLATE 100 MG PO CAPS: 100.0000 mg | ORAL_CAPSULE | Freq: Two times a day (BID) | ORAL | 0 refills | Status: DC

## 2018-07-03 NOTE — Progress Notes (Signed)
Name: Willie Hess   MRN: [id number]    DOB: [birth year]   Date:07/03/2018       Progress Note  Subjective  Chief Complaint  Chief Complaint  Patient presents with  . Lyme Disease    patient was recently diagnosed at an urgent care last week after being bitten by a tick 2 weeks ago    HPI  Patient had tick bite 2 weeks ago- noted fatigue, muscle & joint aches. Went to urgent care was diagnosed with lyme diease and started on a 2 week course of doxycycline. States sill has joint aches, has some improvement of fatigue but not much. He would like a referral to infectious disease. His sister-in-law has chronic lyme disease. States his brother had it as well and did not respond to doxycycline and required multiple antibiotic treatments   GERD Takes zegrid acid reflux is well controlled.   Prediabetes States his diet hasn't changed much Denies polyphagia, polydipsia, polyuria.  Does not check sugars.  Lab Results  Component Value Date   HGBA1C 5.8 (H) 09/20/2017    PHQ2/9: Depression screen [facility] 2/9 07/03/2018 09/20/2017 07/27/2017 10/11/2016 08/25/2016  Decreased Interest 0 0 0 0 0  Down, Depressed, Hopeless 0 0 0 0 0  PHQ - 2 Score 0 0 0 0 0  Altered sleeping 0 - - - -  Tired, decreased energy 0 - - - -  Change in appetite 0 - - - -  Feeling bad or failure about yourself  0 - - - -  Trouble concentrating 0 - - - -  Moving slowly or fidgety/restless 0 - - - -  Suicidal thoughts 0 - - - -  PHQ-9 Score 0 - - - -  Difficult doing work/chores Not difficult at all - - - -     PHQ reviewed. Negative  Patient Active Problem List   Diagnosis Date Noted  . Need for measles-mumps-rubella (MMR) vaccine 09/25/2017  . Prediabetes 09/25/2017  . Hx of smoking 07/27/2017  . Benign neoplasm of cecum   . Benign neoplasm of ascending colon   . Benign neoplasm of transverse colon   . Benign neoplasm of sigmoid colon   . Preventative health care 10/12/2015  . GERD without esophagitis  12/11/2014  . History of kidney stones 12/11/2014  . Decreased creatinine clearance 12/11/2014    Past Medical History:  Diagnosis Date  . Arthritis   . Benign neoplasm of ascending colon   . Benign neoplasm of cecum   . Benign neoplasm of sigmoid colon   . Benign neoplasm of transverse colon   . Decreased creatinine clearance 12/11/2014  . 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 GERD (gastroesophageal reflux disease)   . GERD without esophagitis 12/11/2014  . Headache    migraines 1x/6 mos (lesser HAs more often)  . History of kidney stones    h/o  . Hx of smoking 07/27/2017  . Lyme disease   . Prediabetes 09/25/2017  . Preventative health care 10/12/2015    Past Surgical History:  Procedure Laterality Date  . COLONOSCOPY WITH PROPOFOL N/A 11/12/2015   Procedure: COLONOSCOPY WITH PROPOFOL;  Surgeon: Lucilla Lame, MD;  Location: [place];  Service: Endoscopy;  Laterality: N/A;  . ESOPHAGOGASTRODUODENOSCOPY    . INSERTION OF MESH Left 08/24/2017   Procedure: INSERTION OF MESH;  Surgeon: Jules Husbands, MD;  Location: [facility];  Service: General;  Laterality: Left;  . POLYPECTOMY  11/12/2015   Procedure: POLYPECTOMY;  Surgeon: Evangeline Gula  Allen Norris, MD;  Location: [place];  Service: Endoscopy;;  . ROBOT ASSISTED INGUINAL HERNIA REPAIR Left 08/24/2017   Procedure: ROBOT ASSISTED INGUINAL HERNIA REPAIR;  Surgeon: Jules Husbands, MD;  Location: [facility];  Service: General;  Laterality: Left;  . URETEROSCOPY WITH HOLMIUM LASER LITHOTRIPSY Left 09/23/2014   Procedure: URETEROSCOPY, CYSTOSCOPY WITH STENT PLACEMENT, HOLMIUM STAND BY ;  Surgeon: Royston Cowper, MD;  Location: [facility];  Service: Urology;  Laterality: Left;  Marland Kitchen VASECTOMY    . WISDOM TOOTH EXTRACTION      Social History   Tobacco Use  . Smoking status: Former Smoker    Packs/day: 1.00    Years: 30.00    Pack years: 30.00    Types: Cigarettes    Last attempt to quit: 08/01/2017    Years since quitting: 0.9  . Smokeless  tobacco: Never Used  Substance Use Topics  . Alcohol use: No    Frequency: Never     Current Outpatient Medications:  .  doxycycline (VIBRAMYCIN) 100 MG capsule, , Disp: , Rfl:  .  Omeprazole-Sodium Bicarbonate (ZEGERID) 20-1100 MG CAPS capsule, Take 2 capsules by mouth daily. , Disp: , Rfl:   Allergies  Allergen Reactions  . Bee Venom Swelling    ROS    No other specific complaints in a complete review of systems (except as listed in HPI above).  Objective  Vitals:   07/03/18 0948  BP: 126/74  Pulse: 88  Resp: 16  Temp: 98.2 F (36.8 C)  TempSrc: Oral  SpO2: 94%  Weight: 196 lb 3.2 oz (89 kg)  Height: 6' 2.38" (1.889 m)     Body mass index is 24.93 kg/m.  Nursing Note and Vital Signs reviewed.  Physical Exam Constitutional:      Appearance: Normal appearance.  HENT:     Head: Normocephalic and atraumatic.  Eyes:     Conjunctiva/sclera: Conjunctivae normal.  Cardiovascular:     Rate and Rhythm: Normal rate.  Pulmonary:     Effort: Pulmonary effort is normal.  Musculoskeletal: Normal range of motion.  Skin:    General: Skin is warm and dry.       Neurological:     General: No focal deficit present.     Mental Status: He is alert and oriented to person, place, and time.     Gait: Gait normal.  Psychiatric:        Mood and Affect: Mood normal.        Behavior: Behavior normal.        No results found for this or any previous visit (from the past 48 hour(s)).  Assessment & Plan  1. Lyme disease Will extend doxycycline to 3 week treatment, discussed switching to another preferred antibiotic for lyme treatment vs extended treatment as he has had limited symptom relief; shared decision making to stay on doxycycline and follow-up with infectious disease.  - Ambulatory referral to Infectious Disease - doxycycline (VIBRAMYCIN) 100 MG capsule; Take 1 capsule (100 mg total) by mouth 2 (two) times daily.  Dispense: 14 capsule; Refill: 0  2. GERD  without esophagitis Controlled with meds, avoid triggers  3. Prediabetes Improving diet, recheck at follow-up. Patient would not like further information.

## 2018-08-30 ENCOUNTER — Encounter: Payer: Self-pay | Admitting: Infectious Diseases

## 2018-08-30 ENCOUNTER — Other Ambulatory Visit: Payer: Self-pay

## 2018-08-30 ENCOUNTER — Ambulatory Visit: Payer: Self-pay | Attending: Infectious Diseases | Admitting: Infectious Diseases

## 2018-08-30 VITALS — BP 133/92 | HR 83 | Temp 97.6°F | Ht 74.0 in | Wt 194.0 lb

## 2018-08-30 DIAGNOSIS — R21 Rash and other nonspecific skin eruption: Secondary | ICD-10-CM

## 2018-08-30 DIAGNOSIS — W57XXXA Bitten or stung by nonvenomous insect and other nonvenomous arthropods, initial encounter: Secondary | ICD-10-CM

## 2018-08-30 DIAGNOSIS — Z87891 Personal history of nicotine dependence: Secondary | ICD-10-CM

## 2018-08-30 DIAGNOSIS — Z91030 Bee allergy status: Secondary | ICD-10-CM

## 2018-08-30 NOTE — Progress Notes (Signed)
NAME: Willie Hess  DOB: [birth year]  MRN: [id number]  Date/Time: 08/30/2018 11:37 AM  REQUESTING PROVIDER: [facility]  Subjective:  REASON FOR CONSULT: Lyme disease ? Willie Hess is a 53 y.o. male presents to me because he had Tick bite in May and and was treated with 3 weeks of doxycycline.  He was referred by his PCP for possible Lyme. Patient lives in [place] and in May he had a tick attached to his right thigh for almost 6 hours and he removed it.  He says that he had a one single spot on the back which could be the [employer] tick.  This was on 06/17/2018.  He waited for 8 days and then went to the urgent care center as he had a small bruise at the site and some itching.  There was no obvious bull's-eye target lesion as per the patient.  Labs were drawn and he was given a 2-week course of doxycycline.  He then went to his PCP as he was still not feeling well with pain in his right shoulder and lower back she gave him another week of doxycycline on 07/03/2018 to complete 3 weeks and she sent him to me.  As per patient he is doing better now.  Initially he had worsening right shoulder pain to his baseline shoulder pain and also low back pain.  He was also feeling fatigued.  Initially the doxycycline did not seem to help him but after finishing the full course he is feeling better. He currently has no fever or chills or palpitation or swelling of his joints.  He works at [employer] and has not missed any day at work.  In the beginning he did have to cut short his hours because of fatigue.  He has 2 dogs at home.  They take the monthly medication for tics.  Past Medical History:  Diagnosis Date  . Arthritis   . Benign neoplasm of ascending colon   . Benign neoplasm of cecum   . Benign neoplasm of sigmoid colon   . Benign neoplasm of transverse colon   . Decreased creatinine clearance 12/11/2014  . 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 GERD (gastroesophageal reflux disease)   .  GERD without esophagitis 12/11/2014  . Headache    migraines 1x/6 mos (lesser HAs more often)  . History of kidney stones    h/o  . Hx of smoking 07/27/2017  . Lyme disease   . Prediabetes 09/25/2017  . Preventative health care 10/12/2015    Past Surgical History:  Procedure Laterality Date  . COLONOSCOPY WITH PROPOFOL N/A 11/12/2015   Procedure: COLONOSCOPY WITH PROPOFOL;  Surgeon: Lucilla Lame, MD;  Location: [place];  Service: Endoscopy;  Laterality: N/A;  . ESOPHAGOGASTRODUODENOSCOPY    . INSERTION OF MESH Left 08/24/2017   Procedure: INSERTION OF MESH;  Surgeon: Jules Husbands, MD;  Location: [facility];  Service: General;  Laterality: Left;  . POLYPECTOMY  11/12/2015   Procedure: POLYPECTOMY;  Surgeon: Lucilla Lame, MD;  Location: [place];  Service: Endoscopy;;  . ROBOT ASSISTED INGUINAL HERNIA REPAIR Left 08/24/2017   Procedure: ROBOT ASSISTED INGUINAL HERNIA REPAIR;  Surgeon: Jules Husbands, MD;  Location: [facility];  Service: General;  Laterality: Left;  . URETEROSCOPY WITH HOLMIUM LASER LITHOTRIPSY Left 09/23/2014   Procedure: URETEROSCOPY, CYSTOSCOPY WITH STENT PLACEMENT, HOLMIUM STAND BY ;  Surgeon: Royston Cowper, MD;  Location: [facility];  Service: Urology;  Laterality: Left;  .  VASECTOMY    . WISDOM TOOTH EXTRACTION      Social History   Socioeconomic History  . Marital status: Married    Spouse name: Lauren  . Number of children: 2  . Years of education: Not on file  . Highest education level: Not on file  Occupational History  . Not on file  Social Needs  . Financial resource strain: Not hard at all  . Food insecurity    Worry: Never true    Inability: Never true  . Transportation needs    Medical: No    Non-medical: No  Tobacco Use  . Smoking status: Former Smoker    Packs/day: 1.00    Years: 30.00    Pack years: 30.00    Types: Cigarettes    Quit date: 08/01/2017    Years since quitting: 1.0  . Smokeless tobacco: Never Used  Substance  and Sexual Activity  . Alcohol use: No    Frequency: Never  . Drug use: No  . Sexual activity: Yes  Lifestyle  . Physical activity    Days per week: 6 days    Minutes per session: 60 min  . Stress: Not at all  Relationships  . Social connections    Talks on phone: More than three times a week    Gets together: Three times a week    Attends religious service: More than 4 times per year    Active member of club or organization: Yes    Attends meetings of clubs or organizations: More than 4 times per year    Relationship status: Married  . Intimate partner violence    Fear of current or ex partner: No    Emotionally abused: No    Physically abused: No    Forced sexual activity: No  Other Topics Concern  . Not on file  Social History Narrative  . Not on file    Family History  Problem Relation Age of Onset  . Thyroid disease Mother   . Heart disease Father   . Heart disease Sister   . Other Brother        Lyme Disease  . Heart disease Paternal Grandmother   . Emphysema Paternal Grandfather    Allergies  Allergen Reactions  . Bee Venom Swelling    ? Current Outpatient Medications  Medication Sig Dispense Refill  . doxycycline (VIBRAMYCIN) 100 MG capsule Take 1 capsule (100 mg total) by mouth 2 (two) times daily. 14 capsule 0  . Omeprazole-Sodium Bicarbonate (ZEGERID) 20-1100 MG CAPS capsule Take 2 capsules by mouth daily.      No current facility-administered medications for this visit.      Abtx:  Anti-infectives (From admission, onward)   None      REVIEW OF SYSTEMS:  Const: negative fever, negative chills, negative weight loss Eyes: negative diplopia or visual changes, negative eye pain ENT: negative coryza, negative sore throat Resp: negative cough, hemoptysis, dyspnea Cards: negative for chest pain, palpitations, lower extremity edema GU: negative for frequency, dysuria and hematuria GI: Negative for abdominal pain, diarrhea, bleeding,  constipation Skin: negative for rash and pruritus Heme: negative for easy bruising and gum/nose bleeding MS: As above Neurolo:negative for headaches, dizziness, vertigo, memory problems  Psych: negative for feelings of anxiety, depression  Endocrine: negative for thyroid, diabetes Allergy/Immunology- negative for any medication or food allergies ? Pertinent Positives include : Objective:  VITALS:  BP (!) 133/92 (BP Location: Left Arm, Patient Position: Sitting, Cuff Size: Normal)  Pulse 83   Temp 97.6 F (36.4 C) (Oral)   Ht 6\' 2"  (1.88 m)   Wt 194 lb (88 kg)   BMI 24.91 kg/m  PHYSICAL EXAM:  General: Alert, cooperative, no distress, appears stated age.  Head: Normocephalic, without obvious abnormality, atraumatic. Eyes: Conjunctivae clear, anicteric sclerae. Pupils are equal ENT Nares normal. No drainage or sinus tenderness. Lips, mucosa, and tongue normal. No Thrush Neck: Supple, symmetrical, no adenopathy, thyroid: non tender no carotid bruit and no JVD. Back: No CVA tenderness. Lungs: Clear to auscultation bilaterally. No Wheezing or Rhonchi. No rales. Heart: Regular rate and rhythm, no murmur, rub or gallop. Abdomen: Soft, non-tender,not distended. Bowel sounds normal. No masses Extremities: atraumatic, no cyanosis. No edema. No clubbing Skin: No rashes or lesions. Or bruising Lymph: Cervical, supraclavicular normal. Neurologic: Grossly non-focal Pertinent Labs Obtained the labs from the urgent care center. RMSF IgG not detected RMSF IgM not detected  Lyme disease antibodies IgG 18 Kd band reactive, 23 Kd, 28 Kd, 30 Kd, 39 Kd, 41 Kd overall not detected. IgM no band is positive.  Ehrlichia panel antibody negative  ? Impression/Recommendation ? ?53 year old male had a tick attached to his body for almost 6 hours on Jun 17, 2018.  He then developed a bruise-like rash over the attached area and went to urgent care center and had labs drawn which were essentially  normal for Lyme.  He took 3 weeks of oral doxycycline the tick he says ha had a white spot on his back.  This is likely a [place] tick and thus does not carry borrelia the bacteria that causes Lyme. STARI-this could be a sudden tick associated rash illness from [facility] tick. In either case even if it is Lyme he has been treated with 3 weeks of doxycycline and that is adequate. Ehrlichia and [facility] spotted fever could also tick related illness and they are also treated with doxycycline. I let him know that he has been adequately treated for any type of tickborne illness and he need not have any further antibiotics.  ___________________________________________________ Discussed with patient in detail. Note:  This document was prepared using Dragon voice recognition software and may include unintentional dictation errors.

## 2018-08-30 NOTE — Patient Instructions (Signed)
You are ehre for concern after a tick bite 6 weeks ago and you were given 3 weeks of Doxy. The lab work done on 06/25/18 , which was 8 days before ( 06/17/18) was normal ( RMSF, Ehrlichia, Lyme IgM/IgG) you have completed treatment and you dont need any further medication. Please take a probioitc if needed

## 2018-09-27 ENCOUNTER — Encounter: Payer: Managed Care, Other (non HMO) | Admitting: Family Medicine

## 2018-10-03 ENCOUNTER — Other Ambulatory Visit: Payer: Self-pay

## 2018-10-03 ENCOUNTER — Encounter: Payer: Self-pay | Admitting: Nurse Practitioner

## 2018-10-03 ENCOUNTER — Ambulatory Visit (INDEPENDENT_AMBULATORY_CARE_PROVIDER_SITE_OTHER): Payer: Managed Care, Other (non HMO) | Admitting: Nurse Practitioner

## 2018-10-03 VITALS — BP 116/74 | HR 75 | Temp 97.3°F | Resp 16 | Ht 74.38 in | Wt 199.5 lb

## 2018-10-03 DIAGNOSIS — Z1159 Encounter for screening for other viral diseases: Secondary | ICD-10-CM

## 2018-10-03 DIAGNOSIS — Z0000 Encounter for general adult medical examination without abnormal findings: Secondary | ICD-10-CM | POA: Diagnosis not present

## 2018-10-03 DIAGNOSIS — Z114 Encounter for screening for human immunodeficiency virus [HIV]: Secondary | ICD-10-CM

## 2018-10-03 NOTE — Patient Instructions (Signed)
General recommendations: 150 minutes of physical activity weekly, eat two servings of fish weekly, eat one serving of tree nuts ( cashews, pistachios, pecans, almonds.Marland Kitchen) every other day, eat 6 servings of fruit/vegetables daily and drink plenty of water and avoid sweet beverages. Recommend at least 64 ounces of water daily.   Stay Safe in the [place] The majority of sun exposure occurs before age 53 and skin cancer can take 20 years or more to develop. Whether your sun bathing days are behind you or you still spend time pursuing the perfect tan, you should be concerned about skin cancer.  Remember, the sun's ultraviolet (UV) rays can reflect off water, sand, concrete and snow, and can reach below the water's surface. Certain types of UV light penetrate fog and clouds, so it's possible to get sunburn even on overcast days.  Avoid direct sunlight as much as possible during the peak sun hours, generally 10 a.m. to 3 p.m., or seek shade during this part of day. Wear broad-spectrum sunscreen - with an SPF of at least 30 - containing both UVA and UVB protection. Look for ingredients like [employer] (also known as avobenzone) or titanium dioxide on the label. Reapply sunscreen frequently, at least every two hours when outdoors, especially if you perspire or you've been swimming. Your best bet is to choose water-resistant products that are more likely to stay on your skin. Wear lip balm with an SPF 15 or higher. Wear a hat and other protective clothing while in the sun. Tightly woven fibers and darker clothing generally provide more protection. Also, look for products approved by the American Academy of Dermatology. Wear UV-protective sunglasses.   Healthy Eating Following a healthy eating pattern may help you to achieve and maintain a healthy body weight, reduce the risk of chronic disease, and live a long and productive life. It is important to follow a healthy eating pattern at an appropriate calorie level  for your body. Your nutritional needs should be met primarily through food by choosing a variety of nutrient-rich foods. What are tips for following this plan? Reading food labels  Read labels and choose the following: ? Reduced or low sodium. ? Juices with 100% fruit juice. ? Foods with low saturated fats and high polyunsaturated and monounsaturated fats. ? Foods with whole grains, such as whole wheat, cracked wheat, brown rice, and wild rice. ? Whole grains that are fortified with folic acid. This is recommended for women who are pregnant or who want to become pregnant.  Read labels and avoid the following: ? Foods with a lot of added sugars. These include foods that contain brown sugar, corn sweetener, corn syrup, dextrose, fructose, glucose, high-fructose corn syrup, honey, invert sugar, lactose, malt syrup, maltose, molasses, raw sugar, sucrose, trehalose, or turbinado sugar.  Do not eat more than the following amounts of added sugar per day:  6 teaspoons (25 g) for women.  9 teaspoons (38 g) for men. ? Foods that contain processed or refined starches and grains. ? Refined grain products, such as white flour, degermed cornmeal, white bread, and white rice. Shopping  Choose nutrient-rich snacks, such as vegetables, whole fruits, and nuts. Avoid high-calorie and high-sugar snacks, such as potato chips, fruit snacks, and candy.  Use oil-based dressings and spreads on foods instead of solid fats such as butter, stick margarine, or cream cheese.  Limit pre-made sauces, mixes, and "instant" products such as flavored rice, instant noodles, and ready-made pasta.  Try more plant-protein sources, such as tofu, tempeh,  black beans, edamame, lentils, nuts, and seeds.  Explore eating plans such as the Mediterranean diet or vegetarian diet. Cooking  Use oil to saut or stir-fry foods instead of solid fats such as butter, stick margarine, or lard.  Try baking, boiling, grilling, or broiling  instead of frying.  Remove the fatty part of meats before cooking.  Steam vegetables in water or broth. Meal planning  At meals, imagine dividing your plate into fourths: ? One-half of your plate is fruits and vegetables. ? One-fourth of your plate is whole grains. ? One-fourth of your plate is protein, especially lean meats, poultry, eggs, tofu, beans, or nuts.  Include low-fat dairy as part of your daily diet. Lifestyle  Choose healthy options in all settings, including home, work, school, restaurants, or stores.  Prepare your food safely: ? Wash your hands after handling raw meats. ? Keep food preparation surfaces clean by regularly washing with hot, soapy water. ? Keep raw meats separate from ready-to-eat foods, such as fruits and vegetables. ? Cook seafood, meat, poultry, and eggs to the recommended internal temperature. ? Store foods at safe temperatures. In general:  Keep cold foods at 21F (4.4C) or below.  Keep hot foods at 121F (60C) or above.  Keep your freezer at [facility] (-17.8C) or below.  Foods are no longer safe to eat when they have been between the temperatures of 40-121F (4.4-60C) for more than 2 hours. What foods should I eat? Fruits Aim to eat 2 cup-equivalents of fresh, canned (in natural juice), or frozen fruits each day. Examples of 1 cup-equivalent of fruit include 1 small apple, 8 large strawberries, 1 cup canned fruit,  cup dried fruit, or 1 cup 100% juice. Vegetables Aim to eat 2-3 cup-equivalents of fresh and frozen vegetables each day, including different varieties and colors. Examples of 1 cup-equivalent of vegetables include 2 medium carrots, 2 cups raw, leafy greens, 1 cup chopped vegetable (raw or cooked), or 1 medium baked potato. Grains Aim to eat 6 ounce-equivalents of whole grains each day. Examples of 1 ounce-equivalent of grains include 1 slice of bread, 1 cup ready-to-eat cereal, 3 cups popcorn, or  cup cooked rice, pasta, or  cereal. Meats and other proteins Aim to eat 5-6 ounce-equivalents of protein each day. Examples of 1 ounce-equivalent of protein include 1 egg, 1/2 cup nuts or seeds, or 1 tablespoon (16 g) peanut butter. A cut of meat or fish that is the size of a deck of cards is about 3-4 ounce-equivalents.  Of the protein you eat each week, try to have at least 8 ounces come from seafood. This includes salmon, trout, herring, and anchovies. Dairy Aim to eat 3 cup-equivalents of fat-free or low-fat dairy each day. Examples of 1 cup-equivalent of dairy include 1 cup (240 mL) milk, 8 ounces (250 g) yogurt, 1 ounces (44 g) natural cheese, or 1 cup (240 mL) fortified soy milk. Fats and oils  Aim for about 5 teaspoons (21 g) per day. Choose monounsaturated fats, such as canola and olive oils, avocados, peanut butter, and most nuts, or polyunsaturated fats, such as sunflower, corn, and soybean oils, walnuts, pine nuts, sesame seeds, sunflower seeds, and flaxseed. Beverages  Aim for six 8-oz glasses of water per day. Limit coffee to three to five 8-oz cups per day.  Limit caffeinated beverages that have added calories, such as soda and energy drinks.  Limit alcohol intake to no more than 1 drink a day for nonpregnant women and 2 drinks a day for  men. One drink equals 12 oz of beer (355 mL), 5 oz of wine (148 mL), or 1 oz of hard liquor (44 mL). Seasoning and other foods  Avoid adding excess amounts of salt to your foods. Try flavoring foods with herbs and spices instead of salt.  Avoid adding sugar to foods.  Try using oil-based dressings, sauces, and spreads instead of solid fats. This information is based on general [place] nutrition guidelines. For more information, visit [employer]. Exact amounts may vary based on your nutrition needs. Summary  A healthy eating plan may help you to maintain a healthy weight, reduce the risk of chronic diseases, and stay active throughout your life.  Plan your  meals. Make sure you eat the right portions of a variety of nutrient-rich foods.  Try baking, boiling, grilling, or broiling instead of frying.  Choose healthy options in all settings, including home, work, school, restaurants, or stores. This information is not intended to replace advice given to you by your health care provider. Make sure you discuss any questions you have with your health care provider. Document Released: 05/15/2017 Document Revised: 05/15/2017 Document Reviewed: 05/15/2017 Elsevier Patient Education  2020 [employer].

## 2018-10-03 NOTE — Progress Notes (Signed)
Name: Willie Hess   MRN: [id number]    DOB: Oct 07, 1965   Date:10/03/2018       Progress Note  Subjective  Chief Complaint  Chief Complaint  Patient presents with  . Annual Exam    HPI  Patient presents for annual CPE.  USPSTF grade A and B recommendations:  Diet:  Two meals a day and a snack Breakfast a biscuit or two Skips lunch Supper: meat and two vegetables Snack: cookies, junk food Drink- coffee most of the day, and a couple of diet sodas. Drinks water sometime during the summer.  Exercise:  On his feet a lot but no routine exercise.  Plans to hike more.  Depression: phq 9 is negative Depression screen [facility] 2/9 10/03/2018 07/03/2018 09/20/2017 07/27/2017 10/11/2016  Decreased Interest 0 0 0 0 0  Down, Depressed, Hopeless 0 0 0 0 0  PHQ - 2 Score 0 0 0 0 0  Altered sleeping 0 0 - - -  Tired, decreased energy 0 0 - - -  Change in appetite 0 0 - - -  Feeling bad or failure about yourself  0 0 - - -  Trouble concentrating 0 0 - - -  Moving slowly or fidgety/restless 0 0 - - -  Suicidal thoughts 0 0 - - -  PHQ-9 Score 0 0 - - -  Difficult doing work/chores Not difficult at all Not difficult at all - - -    Hypertension:  BP Readings from Last 3 Encounters:  10/03/18 116/74  08/30/18 (!) 133/92  07/03/18 126/74    Obesity: Wt Readings from Last 3 Encounters:  10/03/18 199 lb 8 oz (90.5 kg)  08/30/18 194 lb (88 kg)  07/03/18 196 lb 3.2 oz (89 kg)   BMI Readings from Last 3 Encounters:  10/03/18 25.35 kg/m  08/30/18 24.91 kg/m  07/03/18 24.93 kg/m     Lipids:  Lab Results  Component Value Date   CHOL 186 09/20/2017   CHOL 200 (H) 08/25/2016   CHOL 198 10/12/2015   Lab Results  Component Value Date   HDL 46 09/20/2017   HDL 43 08/25/2016   HDL 51 10/12/2015   Lab Results  Component Value Date   LDLCALC 125 (H) 09/20/2017   LDLCALC 140 (H) 08/25/2016   LDLCALC 131 (H) 10/12/2015   Lab Results  Component Value Date   TRIG 76 09/20/2017   TRIG  86 08/25/2016   TRIG 81 10/12/2015   Lab Results  Component Value Date   CHOLHDL 4.0 09/20/2017   CHOLHDL 4.7 08/25/2016   CHOLHDL 3.9 10/12/2015   No results found for: LDLDIRECT Glucose:  Glucose  Date Value Ref Range Status  09/20/2017 86 65 - 99 mg/dL Final  08/25/2016 61 (L) 65 - 99 mg/dL Final  10/12/2015 82 65 - 99 mg/dL Final   Glucose, Bld  Date Value Ref Range Status  09/16/2014 132 (H) 65 - 99 mg/dL Final  09/14/2014 146 (H) 65 - 99 mg/dL Final      Office Visit from 10/03/2018 in [facility]  AUDIT-C Score  0       Married STD testing and prevention (HIV/chl/gon/syphilis): denies Hep C: order today  Skin cancer: discussed- does not use sunscreen Colorectal cancer: next due 2022 Prostate cancer: no family history  No results found for: PSA  IPSS Questionnaire (AUA-7): Over the past month.   1)  How often have you had a sensation of not emptying your bladder completely after  you finish urinating?  2 - Less than half the time  2)  How often have you had to urinate again less than two hours after you finished urinating? 1 - Less than 1 time in 5  3)  How often have you found you stopped and started again several times when you urinated?  0 - Not at all  4) How difficult have you found it to postpone urination?  0 - Not at all  5) How often have you had a weak urinary stream?  1 - Less than 1 time in 5  6) How often have you had to push or strain to begin urination?  0 - Not at all  7) How many times did you most typically get up to urinate from the time you went to bed until the time you got up in the morning?  1 - 1 time  Total score:  0-7 mildly symptomatic   8-19 moderately symptomatic   20-35 severely symptomatic   Lung cancer:   Low Dose CT Chest recommended if Age 45-80 years, 30 pack-year currently smoking OR have quit w/in 15years. Patient does not qualify- age.  AAA:  The USPSTF recommends one-time screening with ultrasonography  in men ages 33 to 17 years who have ever smoked. Patient does not qualify- age.     Patient Active Problem List   Diagnosis Date Noted  . Prediabetes 09/25/2017  . Hx of smoking 07/27/2017  . Benign neoplasm of cecum   . Benign neoplasm of ascending colon   . Benign neoplasm of transverse colon   . Benign neoplasm of sigmoid colon   . GERD without esophagitis 12/11/2014    Past Surgical History:  Procedure Laterality Date  . COLONOSCOPY WITH PROPOFOL N/A 11/12/2015   Procedure: COLONOSCOPY WITH PROPOFOL;  Surgeon: Lucilla Lame, MD;  Location: [place];  Service: Endoscopy;  Laterality: N/A;  . ESOPHAGOGASTRODUODENOSCOPY    . INSERTION OF MESH Left 08/24/2017   Procedure: INSERTION OF MESH;  Surgeon: Jules Husbands, MD;  Location: [facility];  Service: General;  Laterality: Left;  . POLYPECTOMY  11/12/2015   Procedure: POLYPECTOMY;  Surgeon: Lucilla Lame, MD;  Location: [place];  Service: Endoscopy;;  . ROBOT ASSISTED INGUINAL HERNIA REPAIR Left 08/24/2017   Procedure: ROBOT ASSISTED INGUINAL HERNIA REPAIR;  Surgeon: Jules Husbands, MD;  Location: [facility];  Service: General;  Laterality: Left;  . URETEROSCOPY WITH HOLMIUM LASER LITHOTRIPSY Left 09/23/2014   Procedure: URETEROSCOPY, CYSTOSCOPY WITH STENT PLACEMENT, HOLMIUM STAND BY ;  Surgeon: Royston Cowper, MD;  Location: [facility];  Service: Urology;  Laterality: Left;  Marland Kitchen VASECTOMY    . WISDOM TOOTH EXTRACTION      Family History  Problem Relation Age of Onset  . Thyroid disease Mother   . Heart disease Father   . Heart disease Sister   . Other Brother        Lyme Disease  . Heart disease Paternal Grandmother   . Emphysema Paternal Grandfather     Social History   Socioeconomic History  . Marital status: Married    Spouse name: Lauren  . Number of children: 2  . Years of education: Not on file  . Highest education level: Not on file  Occupational History  . Not on file  Social Needs  . Financial  resource strain: Not hard at all  . Food insecurity    Worry: Never true    Inability: Never true  . Transportation  needs    Medical: No    Non-medical: No  Tobacco Use  . Smoking status: Former Smoker    Packs/day: 1.00    Years: 30.00    Pack years: 30.00    Types: Cigarettes    Quit date: 08/01/2017    Years since quitting: 1.1  . Smokeless tobacco: Never Used  Substance and Sexual Activity  . Alcohol use: No    Frequency: Never  . Drug use: No  . Sexual activity: Yes    Partners: Female  Lifestyle  . Physical activity    Days per week: 6 days    Minutes per session: 60 min  . Stress: Not at all  Relationships  . Social connections    Talks on phone: More than three times a week    Gets together: Three times a week    Attends religious service: More than 4 times per year    Active member of club or organization: Yes    Attends meetings of clubs or organizations: More than 4 times per year    Relationship status: Married  . Intimate partner violence    Fear of current or ex partner: No    Emotionally abused: No    Physically abused: No    Forced sexual activity: No  Other Topics Concern  . Not on file  Social History Narrative  . Not on file     Current Outpatient Medications:  .  Omeprazole-Sodium Bicarbonate (ZEGERID) 20-1100 MG CAPS capsule, Take 2 capsules by mouth daily. , Disp: , Rfl:   Allergies  Allergen Reactions  . Bee Venom Swelling     Review of Systems  Constitutional: Negative for chills, fever and malaise/fatigue.  HENT: Negative for congestion, sinus pain and sore throat.   Eyes: Negative for blurred vision.  Respiratory: Negative for cough and shortness of breath.   Cardiovascular: Negative for chest pain, palpitations and leg swelling.  Gastrointestinal: Negative for abdominal pain, constipation, diarrhea and nausea.  Genitourinary: Negative for dysuria.  Musculoskeletal: Negative for falls and joint pain.  Skin: Negative for rash.   Neurological: Negative for dizziness and headaches.  Endo/Heme/Allergies: Negative for polydipsia.  Psychiatric/Behavioral: The patient is not nervous/anxious and does not have insomnia.       Objective  Vitals:   10/03/18 0840  BP: 116/74  Pulse: 75  Resp: 16  Temp: (!) 97.3 F (36.3 C)  TempSrc: Temporal  SpO2: 99%  Weight: 199 lb 8 oz (90.5 kg)  Height: 6' 2.38" (1.889 m)    Body mass index is 25.35 kg/m.  Physical Exam Constitutional: Patient appears well-developed and well-nourished. No distress.  HENT: Head: Normocephalic and atraumatic. Ears: B TMs ok, no erythema or effusion; Nose: Nose normal. Mouth/Throat: Oropharynx is clear and moist. No oropharyngeal exudate.  Eyes: Conjunctivae and EOM are normal. Pupils are equal, round, and reactive to light. No scleral icterus.  Neck: Normal range of motion. Neck supple. No JVD present. No thyromegaly present.  Cardiovascular: Normal rate, regular rhythm and normal heart sounds.  No murmur heard. No BLE edema. Pulmonary/Chest: Effort normal and breath sounds normal. No respiratory distress. Abdominal: Soft. Bowel sounds are normal, no distension. There is no tenderness. no masses MALE GENITALIA: deferred Musculoskeletal: Normal range of motion, no joint effusions. No gross deformities Neurological: he is alert and oriented to person, place, and time. No cranial nerve deficit. Coordination, balance, strength, speech and gait are normal.  Skin: Skin is warm and dry. No rash noted. No erythema. ,  small mobile eraser tip sized lump to right neck- not concerning at this time- patient states has been there unchanged since 2000.  Psychiatric: Patient has a normal mood and affect. behavior is normal. Judgment and thought content normal.    No results found for this or any previous visit (from the past 2160 hour(s)).    Fall Risk: Fall Risk  10/03/2018 07/03/2018 09/20/2017 07/27/2017 10/11/2016  Falls in the past year? 0 0 No No No   Number falls in past yr: 0 0 - - -  Injury with Fall? 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 Preventative health care Discussed weight, plans to cut down snacking and increase exercise  - CMP14+EGFR - Lipid Profile  2. Encounter for hepatitis C screening test for low risk patient - Hepatitis C Antibody  3. Screening for HIV (human immunodeficiency virus) - HIV antibody (with reflex)  -Prostate cancer screening and PSA options (with potential risks and benefits of testing vs not testing) were discussed along with recent recs/guidelines. -USPSTF grade A and B recommendations reviewed with patient; age-appropriate recommendations, preventive care, screening tests, etc discussed and encouraged; healthy living encouraged; see AVS for patient education given to patient -Discussed importance of 150 minutes of physical activity weekly, eat two servings of fish weekly, eat one serving of tree nuts ( cashews, pistachios, pecans, almonds.Marland Kitchen) every other day, eat 6 servings of fruit/vegetables daily and drink plenty of water and avoid sweet beverages.  -Reviewed Health Maintenance: will get flu shot next month

## 2018-10-04 LAB — CMP14+EGFR
ALT: 35 IU/L (ref 0–44)
AST: 16 IU/L (ref 0–40)
Albumin/Globulin Ratio: 1.3 (ref 1.2–2.2)
Albumin: 4.1 g/dL (ref 3.8–4.9)
Alkaline Phosphatase: 67 IU/L (ref 39–117)
BUN/Creatinine Ratio: 12 (ref 9–20)
BUN: 12 mg/dL (ref 6–24)
Bilirubin Total: 0.3 mg/dL (ref 0.0–1.2)
CO2: 22 mmol/L (ref 20–29)
Calcium: 9.7 mg/dL (ref 8.7–10.2)
Chloride: 105 mmol/L (ref 96–106)
Creatinine, Ser: 1.03 mg/dL (ref 0.76–1.27)
GFR calc Af Amer: 95 mL/min/{1.73_m2} (ref >59)
GFR calc non Af Amer: 83 mL/min/{1.73_m2} (ref >59)
Globulin, Total: 3.1 g/dL (ref 1.5–4.5)
Glucose: 97 mg/dL (ref 65–99)
Potassium: 4.8 mmol/L (ref 3.5–5.2)
Sodium: 142 mmol/L (ref 134–144)
Total Protein: 7.2 g/dL (ref 6.0–8.5)

## 2018-10-04 LAB — LIPID PANEL
Chol/HDL Ratio: 5.4 ratio — ABNORMAL HIGH (ref 0.0–5.0)
Cholesterol, Total: 232 mg/dL — ABNORMAL HIGH (ref 100–199)
HDL: 43 mg/dL (ref >39)
LDL Calculated: 156 mg/dL — ABNORMAL HIGH (ref 0–99)
Triglycerides: 166 mg/dL — ABNORMAL HIGH (ref 0–149)
VLDL Cholesterol Cal: 33 mg/dL (ref 5–40)

## 2018-10-04 LAB — HIV ANTIBODY (ROUTINE TESTING W REFLEX): HIV Screen 4th Generation wRfx: NONREACTIVE

## 2018-10-04 LAB — HEPATITIS C ANTIBODY: Hep C Virus Ab: <0.1 s/co ratio (ref 0.0–0.9)

## 2018-10-09 ENCOUNTER — Other Ambulatory Visit: Payer: Self-pay | Admitting: Physician Assistant

## 2019-10-07 ENCOUNTER — Ambulatory Visit (INDEPENDENT_AMBULATORY_CARE_PROVIDER_SITE_OTHER): Payer: Managed Care, Other (non HMO) | Admitting: Family Medicine

## 2019-10-07 ENCOUNTER — Encounter: Payer: Self-pay | Admitting: Family Medicine

## 2019-10-07 ENCOUNTER — Other Ambulatory Visit: Payer: Self-pay

## 2019-10-07 VITALS — BP 130/78 | HR 98 | Temp 98.3°F | Resp 16 | Ht 74.0 in | Wt 208.8 lb

## 2019-10-07 DIAGNOSIS — Z23 Encounter for immunization: Secondary | ICD-10-CM

## 2019-10-07 DIAGNOSIS — Z87891 Personal history of nicotine dependence: Secondary | ICD-10-CM

## 2019-10-07 DIAGNOSIS — E785 Hyperlipidemia, unspecified: Secondary | ICD-10-CM | POA: Diagnosis not present

## 2019-10-07 DIAGNOSIS — R7303 Prediabetes: Secondary | ICD-10-CM

## 2019-10-07 DIAGNOSIS — J310 Chronic rhinitis: Secondary | ICD-10-CM

## 2019-10-07 DIAGNOSIS — J329 Chronic sinusitis, unspecified: Secondary | ICD-10-CM

## 2019-10-07 DIAGNOSIS — H9202 Otalgia, left ear: Secondary | ICD-10-CM

## 2019-10-07 DIAGNOSIS — Z0000 Encounter for general adult medical examination without abnormal findings: Secondary | ICD-10-CM | POA: Diagnosis not present

## 2019-10-07 DIAGNOSIS — Q5561 Curvature of penis (lateral): Secondary | ICD-10-CM

## 2019-10-07 MED ORDER — NEOMYCIN-POLYMYXIN-HC 3.5-10000-1 OT SOLN: 4.0000 [drp] | Freq: Four times a day (QID) | OTIC | 0 refills | Status: AC

## 2019-10-07 NOTE — Patient Instructions (Signed)
Preventive Care 41-54 Years Old, Male Preventive care refers to lifestyle choices and visits with your health care provider that can promote health and wellness. This includes:  A yearly physical exam. This is also called an annual well check.  Regular dental and eye exams.  Immunizations.  Screening for certain conditions.  Healthy lifestyle choices, such as eating a healthy diet, getting regular exercise, not using drugs or products that contain nicotine and tobacco, and limiting alcohol use. What can I expect for my preventive care visit? Physical exam Your health care provider will check:  Height and weight. These may be used to calculate body mass index (BMI), which is a measurement that tells if you are at a healthy weight.  Heart rate and blood pressure.  Your skin for abnormal spots. Counseling Your health care provider may ask you questions about:  Alcohol, tobacco, and drug use.  Emotional well-being.  Home and relationship well-being.  Sexual activity.  Eating habits.  Work and work Statistician. What immunizations do I need?  Influenza (flu) vaccine  This is recommended every year. Tetanus, diphtheria, and pertussis (Tdap) vaccine  You may need a Td booster every 10 years. Varicella (chickenpox) vaccine  You may need this vaccine if you have not already been vaccinated. Zoster (shingles) vaccine  You may need this after age 64. Measles, mumps, and rubella (MMR) vaccine  You may need at least one dose of MMR if you were born in 1957 or later. You may also need a second dose. Pneumococcal conjugate (PCV13) vaccine  You may need this if you have certain conditions and were not previously vaccinated. Pneumococcal polysaccharide (PPSV23) vaccine  You may need one or two doses if you smoke cigarettes or if you have certain conditions. Meningococcal conjugate (MenACWY) vaccine  You may need this if you have certain conditions. Hepatitis A  vaccine  You may need this if you have certain conditions or if you travel or work in places where you may be exposed to hepatitis A. Hepatitis B vaccine  You may need this if you have certain conditions or if you travel or work in places where you may be exposed to hepatitis B. Haemophilus influenzae type b (Hib) vaccine  You may need this if you have certain risk factors. Human papillomavirus (HPV) vaccine  If recommended by your health care provider, you may need three doses over 6 months. You may receive vaccines as individual doses or as more than one vaccine together in one shot (combination vaccines). Talk with your health care provider about the risks and benefits of combination vaccines. What tests do I need? Blood tests  Lipid and cholesterol levels. These may be checked every 5 years, or more frequently if you are over 60 years old.  Hepatitis C test.  Hepatitis B test. Screening  Lung cancer screening. You may have this screening every year starting at age 43 if you have a 30-pack-year history of smoking and currently smoke or have quit within the past 15 years.  Prostate cancer screening. Recommendations will vary depending on your family history and other risks.  Colorectal cancer screening. All adults should have this screening starting at age 72 and continuing until age 2. Your health care provider may recommend screening at age 14 if you are at increased risk. You will have tests every 1-10 years, depending on your results and the type of screening test.  Diabetes screening. This is done by checking your blood sugar (glucose) after you have not eaten  for a while (fasting). You may have this done every 1-3 years.  Sexually transmitted disease (STD) testing. Follow these instructions at home: Eating and drinking  Eat a diet that includes fresh fruits and vegetables, whole grains, lean protein, and low-fat dairy products.  Take vitamin and mineral supplements as  recommended by your health care provider.  Do not drink alcohol if your health care provider tells you not to drink.  If you drink alcohol: ? Limit how much you have to 0-2 drinks a day. ? Be aware of how much alcohol is in your drink. In the [place] one drink equals one 12 oz bottle of beer (355 mL), one 5 oz glass of wine (148 mL), or one 1 oz glass of hard liquor (44 mL). Lifestyle  Take daily care of your teeth and gums.  Stay active. Exercise for at least 30 minutes on 5 or more days each week.  Do not use any products that contain nicotine or tobacco, such as cigarettes, e-cigarettes, and chewing tobacco. If you need help quitting, ask your health care provider.  If you are sexually active, practice safe sex. Use a condom or other form of protection to prevent STIs (sexually transmitted infections).  Talk with your health care provider about taking a low-dose aspirin every day starting at age 53. What's next?  Go to your health care provider once a year for a well check visit.  Ask your health care provider how often you should have your eyes and teeth checked.  Stay up to date on all vaccines. This information is not intended to replace advice given to you by your health care provider. Make sure you discuss any questions you have with your health care provider. Document Revised: 01/25/2018 Document Reviewed: 01/25/2018 Elsevier Patient Education  2020 [employer].

## 2019-10-07 NOTE — Progress Notes (Signed)
Patient: Willie Hess, Male    DOB: 09-26-1965, 54 y.o.   MRN: [id number] Delsa Grana, PA-C Visit Date: 10/07/2019  Today's Provider: Delsa Grana, PA-C   Chief Complaint  Patient presents with  . Annual Exam   Subjective:   Annual physical exam:  Willie Hess is a 54 y.o. male who presents today for health maintenance and annual & complete physical exam.   Exercise/Activity: No planned exercise he is very active at his job.  Diet/nutrition: Patient has just recently changed his diet, he eats three meals a day and does have some fast food.   Sleep: Patient states he sleeps very well.  Acute change and concern with penus - curved and recently changed - uncomfortable when relaxed, and uncomfortable with sex for pt and sex - hasn't seen anyone before for it, occurred about a year ago, seemed to occur suddenly, no ED problems  Only other concerns is some increased weight - working more, diet changed and he's working on diet and trying to change what he's eating   USPSTF grade A and B recommendations - reviewed and addressed today  Depression:  Phq 9 completed today by patient, was reviewed by me with patient in the room, score is negative, pt feels overall mood it good PHQ 2/9 Scores 10/07/2019 10/03/2018 07/03/2018 09/20/2017  PHQ - 2 Score 0 0 0 0  PHQ- 9 Score 0 0 0 -   Depression screen [facility] 2/9 10/07/2019 10/03/2018 07/03/2018 09/20/2017 07/27/2017  Decreased Interest 0 0 0 0 0  Down, Depressed, Hopeless 0 0 0 0 0  PHQ - 2 Score 0 0 0 0 0  Altered sleeping 0 0 0 - -  Tired, decreased energy 0 0 0 - -  Change in appetite 0 0 0 - -  Feeling bad or failure about yourself  0 0 0 - -  Trouble concentrating 0 0 0 - -  Moving slowly or fidgety/restless 0 0 0 - -  Suicidal thoughts 0 0 0 - -  PHQ-9 Score 0 0 0 - -  Difficult doing work/chores Not difficult at all Not difficult at all Not difficult at all - -   Hep C Screening: Completed 10/03/2018 STD testing and prevention  (HIV/chl/gon/syphilis): HIV completed 10/03/2018  Intimate partner violence: No  Prostate cancer:   Prostate cancer screening with PSA: Discussed risks and benefits of PSA testing and provided handout. Pt declines to have PSA drawn today.  No results found for: PSA No results found for: PSA1, PSA  Urinary Symptoms:  IPSS Questionnaire (AUA-7): Over the past month.   1)  How often have you had a sensation of not emptying your bladder completely after you finish urinating?  0 - Not at all  2)  How often have you had to urinate again less than two hours after you finished urinating? 1 - Less than 1 time in 5  3)  How often have you found you stopped and started again several times when you urinated?  0 - Not at all  4) How difficult have you found it to postpone urination?  0 - Not at all  5) How often have you had a weak urinary stream?  0 - Not at all  6) How often have you had to push or strain to begin urination?  0 - Not at all  7) How many times did you most typically get up to urinate from the time you went to bed until the  time you got up in the morning?  1 - 1 time  Total score:  0-7 mildly symptomatic   8-19 moderately symptomatic   20-35 severely symptomatic    Advanced Care Planning:  A voluntary discussion about advance care planning including the explanation and discussion of advance directives.  Discussed health care proxy and Living will, and the patient was able to identify a health care proxy as his wife - Willie Hess.  Patient does not have a living will at present time. If patient does have living will, I have requested they bring this to the clinic to be scanned in to their chart.  Health Maintenance  Topic Date Due  . INFLUENZA VACCINE  09/15/2019  . COVID-19 Vaccine (1) 10/23/2019 (Originally 09/12/1977)  . COLONOSCOPY  11/11/2020  . TETANUS/TDAP  10/12/2026  . Hepatitis C Screening  Completed  . HIV Screening  Completed    Skin cancer:   last skin survey was.   Pt reports no hx of skin cancer, suspicious lesions/biopsies in the past.  Colorectal cancer:  colonoscopy is due next year Pt denies melena, hematochezia, change in BM or stool caliber  Lung cancer:  Low Dose CT Chest recommended if Age 25-80 years, 20 pack-year currently smoking OR have quit w/in 15years. Patient does qualify.     Social History   Tobacco Use  . Smoking status: Former Smoker    Packs/day: 1.00    Years: 30.00    Pack years: 30.00    Types: Cigarettes    Quit date: 08/01/2017    Years since quitting: 2.1  . Smokeless tobacco: Never Used  Substance Use Topics  . Alcohol use: No      Alcohol screening:   Office Visit from 10/03/2018 in [facility]  AUDIT-C Score 0     AAA:  The USPSTF recommends one-time screening with ultrasonography in men ages 26 to 53 years who have ever smoked  ECG not indicated  Blood pressure/Hypertension: BP Readings from Last 3 Encounters:  10/07/19 130/78  10/03/18 116/74  08/30/18 (!) 133/92   Weight/Obesity: Wt Readings from Last 3 Encounters:  10/07/19 208 lb 12.8 oz (94.7 kg)  10/03/18 199 lb 8 oz (90.5 kg)  08/30/18 194 lb (88 kg)   BMI Readings from Last 3 Encounters:  10/07/19 26.81 kg/m  10/03/18 25.35 kg/m  08/30/18 24.91 kg/m    Lipids:  Lab Results  Component Value Date   CHOL 232 (H) 10/03/2018   CHOL 186 09/20/2017   CHOL 200 (H) 08/25/2016   Lab Results  Component Value Date   HDL 43 10/03/2018   HDL 46 09/20/2017   HDL 43 08/25/2016   Lab Results  Component Value Date   LDLCALC 156 (H) 10/03/2018   LDLCALC 125 (H) 09/20/2017   LDLCALC 140 (H) 08/25/2016   Lab Results  Component Value Date   TRIG 166 (H) 10/03/2018   TRIG 76 09/20/2017   TRIG 86 08/25/2016   Lab Results  Component Value Date   CHOLHDL 5.4 (H) 10/03/2018   CHOLHDL 4.0 09/20/2017   CHOLHDL 4.7 08/25/2016   No results found for: LDLDIRECT Based on the results of lipid panel his/her cardiovascular  risk factor ( using [place] )  in the next 10 years is : The 10-year ASCVD risk score Mikey Bussing DC Jr., et al., 2013) is: 7.2%   Values used to calculate the score:     Age: 36 years     Sex: Male  Is Non-Hispanic African American: No     Diabetic: No     Tobacco smoker: No     Systolic Blood Pressure: 485 mmHg     Is BP treated: No     HDL Cholesterol: 43 mg/dL     Total Cholesterol: 232 mg/dL  Glucose:  Glucose  Date Value Ref Range Status  10/03/2018 97 65 - 99 mg/dL Final  09/20/2017 86 65 - 99 mg/dL Final  08/25/2016 61 (L) 65 - 99 mg/dL Final   Glucose, Bld  Date Value Ref Range Status  09/16/2014 132 (H) 65 - 99 mg/dL Final  09/14/2014 146 (H) 65 - 99 mg/dL Final    Social History      He  reports that he quit smoking about 2 years ago. His smoking use included cigarettes. He has a 30.00 pack-year smoking history. He has never used smokeless tobacco. He reports that he does not drink alcohol and does not use drugs.       Social History   Socioeconomic History  . Marital status: Married    Spouse name: Lauren  . Number of children: 2  . Years of education: Not on file  . Highest education level: Not on file  Occupational History  . Not on file  Tobacco Use  . Smoking status: Former Smoker    Packs/day: 1.00    Years: 30.00    Pack years: 30.00    Types: Cigarettes    Quit date: 08/01/2017    Years since quitting: 2.1  . Smokeless tobacco: Never Used  Vaping Use  . Vaping Use: Never used  Substance and Sexual Activity  . Alcohol use: No  . Drug use: No  . Sexual activity: Yes    Partners: Female  Other Topics Concern  . Not on file  Social History Narrative  . Not on file   Social Determinants of Health   Financial Resource Strain:   . Difficulty of Paying Living Expenses: Not on file  Food Insecurity:   . Worried About Charity fundraiser in the Last Year: Not on file  . Ran Out of Food in the Last Year: Not on file  Transportation Needs:     . Lack of Transportation (Medical): Not on file  . Lack of Transportation (Non-Medical): Not on file  Physical Activity:   . Days of Exercise per Week: Not on file  . Minutes of Exercise per Session: Not on file  Stress:   . Feeling of Stress : Not on file  Social Connections:   . Frequency of Communication with Friends and Family: Not on file  . Frequency of Social Gatherings with Friends and Family: Not on file  . Attends Religious Services: Not on file  . Active Member of Clubs or Organizations: Not on file  . Attends Archivist Meetings: Not on file  . Marital Status: Not on file     Family History        Family Status  Relation Name Status  . Mother  Alive, age 90y  . Father  Deceased       heart disease  . Sister oldest Alive  . Brother oldest Alive  . Daughter  Alive  . MGM  Deceased at age 46       old age  . MGF  Deceased       unknown  . PGM  Deceased       heart disease  . PGF  Deceased  emphysema  . Sister  Alive  . Brother  Alive  . Daughter youngest Alive       small hole in heart        His family history includes Emphysema in his paternal grandfather; Heart disease in his father, paternal grandmother, and sister; Other in his brother; Thyroid disease in his mother.       Family History  Problem Relation Age of Onset  . Thyroid disease Mother   . Heart disease Father   . Heart disease Sister   . Other Brother        Lyme Disease  . Heart disease Paternal Grandmother   . Emphysema Paternal Grandfather     Patient Active Problem List   Diagnosis Date Noted  . Prediabetes 09/25/2017  . Hx of smoking 07/27/2017  . Benign neoplasm of cecum   . Benign neoplasm of ascending colon   . Benign neoplasm of transverse colon   . Benign neoplasm of sigmoid colon   . GERD without esophagitis 12/11/2014    Past Surgical History:  Procedure Laterality Date  . COLONOSCOPY WITH PROPOFOL N/A 11/12/2015   Procedure: COLONOSCOPY WITH  PROPOFOL;  Surgeon: Lucilla Lame, MD;  Location: [facility];  Service: Endoscopy;  Laterality: N/A;  . ESOPHAGOGASTRODUODENOSCOPY    . INSERTION OF MESH Left 08/24/2017   Procedure: INSERTION OF MESH;  Surgeon: Jules Husbands, MD;  Location: [facility];  Service: General;  Laterality: Left;  . POLYPECTOMY  11/12/2015   Procedure: POLYPECTOMY;  Surgeon: Lucilla Lame, MD;  Location: [place];  Service: Endoscopy;;  . ROBOT ASSISTED INGUINAL HERNIA REPAIR Left 08/24/2017   Procedure: ROBOT ASSISTED INGUINAL HERNIA REPAIR;  Surgeon: Jules Husbands, MD;  Location: [facility];  Service: General;  Laterality: Left;  . URETEROSCOPY WITH HOLMIUM LASER LITHOTRIPSY Left 09/23/2014   Procedure: URETEROSCOPY, CYSTOSCOPY WITH STENT PLACEMENT, HOLMIUM STAND BY ;  Surgeon: Royston Cowper, MD;  Location: [facility];  Service: Urology;  Laterality: Left;  Marland Kitchen VASECTOMY    . WISDOM TOOTH EXTRACTION       Current Outpatient Medications:  .  Omeprazole-Sodium Bicarbonate (ZEGERID) 20-1100 MG CAPS capsule, Take 2 capsules by mouth daily. , Disp: , Rfl:   Allergies  Allergen Reactions  . Bee Venom Swelling    Patient Care Team: Delsa Grana, PA-C as PCP - General (Family Medicine)   Chart Review: I personally reviewed active problem list, medication list, allergies, family history, social history, health maintenance, notes from last encounter, lab results, imaging with the patient/caregiver today.   Review of Systems  Constitutional: Negative.  Negative for activity change, appetite change, fatigue and unexpected weight change.  HENT: Negative.   Eyes: Negative.   Respiratory: Negative.  Negative for shortness of breath.   Cardiovascular: Negative.  Negative for chest pain, palpitations and leg swelling.  Gastrointestinal: Negative.  Negative for abdominal pain and blood in stool.  Endocrine: Negative.   Genitourinary: Negative.  Negative for decreased urine volume, difficulty urinating,  testicular pain and urgency.  Skin: Negative.  Negative for color change and pallor.  Allergic/Immunologic: Negative.   Neurological: Negative.  Negative for syncope, weakness, light-headedness and numbness.  Psychiatric/Behavioral: Negative.  Negative for confusion, dysphoric mood, self-injury and suicidal ideas. The patient is not nervous/anxious.   All other systems reviewed and are negative.         Objective:   Vitals:  Vitals:   10/07/19 1003  BP: 130/78  Pulse: 98  Resp: 16  Temp: 98.3 F (36.8 C)  TempSrc: Oral  SpO2: 100%  Weight: 208 lb 12.8 oz (94.7 kg)  Height: 6\' 2"  (1.88 m)    Body mass index is 26.81 kg/m.  Physical Exam Vitals and nursing note reviewed.  Constitutional:      General: He is not in acute distress.    Appearance: Normal appearance. He is well-developed. He is not ill-appearing or toxic-appearing.  HENT:     Head: Normocephalic and atraumatic.     Jaw: There is normal jaw occlusion. No trismus.     Right Ear: Hearing, tympanic membrane, ear canal and external ear normal.     Left Ear: Hearing and external ear normal. Swelling present.  No middle ear effusion. No mastoid tenderness. Tympanic membrane is injected and erythematous. Tympanic membrane is not perforated.     Nose: Mucosal edema, congestion and rhinorrhea present. Rhinorrhea is clear.     Right Turbinates: Enlarged and swollen.     Left Turbinates: Enlarged and swollen.     Right Sinus: No maxillary sinus tenderness or frontal sinus tenderness.     Left Sinus: No maxillary sinus tenderness or frontal sinus tenderness.     Mouth/Throat:     Mouth: Mucous membranes are moist.     Pharynx: Uvula midline. Posterior oropharyngeal erythema present. No pharyngeal swelling, oropharyngeal exudate or uvula swelling.     Tonsils: No tonsillar exudate.  Eyes:     General: Lids are normal.     Conjunctiva/sclera: Conjunctivae normal.     Pupils: Pupils are equal, round, and reactive to  light.  Neck:     Trachea: Trachea and phonation normal. No tracheal deviation.  Cardiovascular:     Rate and Rhythm: Normal rate and regular rhythm.  No extrasystoles are present.    Pulses: Normal pulses.          Radial pulses are 2+ on the right side and 2+ on the left side.       Posterior tibial pulses are 2+ on the right side and 2+ on the left side.     Heart sounds: Normal heart sounds. No murmur heard.  No friction rub. No gallop.      Comments: Split s2 Pulmonary:     Effort: Pulmonary effort is normal.     Breath sounds: Normal breath sounds. No wheezing, rhonchi or rales.  Abdominal:     General: Bowel sounds are normal. There is no distension.     Palpations: Abdomen is soft.     Tenderness: There is no abdominal tenderness. There is no guarding or rebound.  Musculoskeletal:        General: Normal range of motion.     Cervical back: Normal range of motion and neck supple.  Skin:    General: Skin is warm and dry.     Capillary Refill: Capillary refill takes less than 2 seconds.     Findings: No rash.  Neurological:     Mental Status: He is alert and oriented to person, place, and time.     Gait: Gait normal.  Psychiatric:        Speech: Speech normal.        Behavior: Behavior normal.      No results found for this or any previous visit (from the past 2160 hour(s)).   Fall Risk: Fall Risk  10/07/2019 10/03/2018 07/03/2018 09/20/2017 07/27/2017  Falls in the past year? 0 0 0 No No  Number falls in past yr: 0 0 0 - -  Injury with Fall? 0 0 0 - -  Follow up Falls evaluation completed - - - -    Functional Status Survey: Is the patient deaf or have difficulty hearing?: No Does the patient have difficulty seeing, even when wearing glasses/contacts?: No Does the patient have difficulty concentrating, remembering, or making decisions?: No Does the patient have difficulty walking or climbing stairs?: No Does the patient have difficulty dressing or bathing?: No Does  the patient have difficulty doing errands alone such as visiting a doctor's office or shopping?: No   Assessment & Plan:    CPE completed today  . Prostate cancer screening and PSA options (with potential risks and benefits of testing vs not testing) were discussed along with recent recs/guidelines, shared decision making and handout/information given to pt today  . USPSTF grade A and B recommendations reviewed with patient; age-appropriate recommendations, preventive care, screening tests, etc discussed and encouraged; healthy living encouraged; see AVS for patient education given to patient  . Discussed importance of 150 minutes of physical activity weekly, AHA exercise recommendations given to pt in AVS/handout  . Discussed importance of healthy diet:  eating lean meats and proteins, avoiding trans fats and saturated fats, avoid simple sugars and excessive carbs in diet, eat 6 servings of fruit/vegetables daily and drink plenty of water and avoid sweet beverages.  DASH diet reviewed if pt has HTN  . Recommended pt to do annual eye exam and routine dental exams/cleanings  . Reviewed Health Maintenance: Health Maintenance  Topic Date Due  . INFLUENZA VACCINE  09/15/2019  . COVID-19 Vaccine (1) 10/23/2019 (Originally 09/12/1977)  . COLONOSCOPY  11/11/2020  . TETANUS/TDAP  10/12/2026  . Hepatitis C Screening  Completed  . HIV Screening  Completed    . Immunizations: Immunization History  Administered Date(s) Administered  . Influenza,inj,Quad PF,6+ Mos 12/11/2014, 10/27/2015, 10/11/2016  . Tdap 10/11/2016     ICD-10-CM   1. Adult general medical exam  Z00.00   2. Prediabetes  R73.03    recheck  3. Hyperlipidemia, unspecified hyperlipidemia type  E78.5    he has been working on diet, he is hesitant to start statin, we reviewed ASCVD risk calculator and recommendations/indications for statins  4. Need for influenza vaccination  Z23 Flu Vaccine QUAD 6+ mos PF IM (Fluarix Quad PF)    5. Acute otalgia, left  H92.02 neomycin-polymyxin-hydrocortisone (CORTISPORIN) OTIC solution   avoid using Q-tips, can use debrox, gentle irrigation, left EAC and TM injected/inflammed, cortisporin drops  6. 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 Stopped smoking with greater than 30 pack year history  Z87.891    quit in 2019 - open to low dose CT lung CA screening   Labs recently done with work physical - requesting records for labs from [facility] - has lipids, renal function, A1C  Delsa Grana, PA-C 10/07/19 10:20 AM  [facility]

## 2019-10-10 ENCOUNTER — Telehealth: Payer: Self-pay

## 2019-10-10 NOTE — Telephone Encounter (Signed)
Contacted patient regarding lung CT screening clinic after receiving referral from Delsa Grana, APP.  I explained program to patient and he is agreeable.  CT scan and shared decision making appointment scheduled for Sept 16 at 1:15.  Address to imaging center given to patient.  Patient is a former smoking who quit one year ago.  Patient says quitting was very hard and he lapsed a few times.  He started smoking at age 54 and smoked 1 and 1/2 packs per day. He smoking 25 to 50% of second pack.

## 2019-10-11 ENCOUNTER — Other Ambulatory Visit: Payer: Self-pay

## 2019-10-11 ENCOUNTER — Encounter: Payer: Self-pay | Admitting: Urology

## 2019-10-11 ENCOUNTER — Other Ambulatory Visit: Payer: Self-pay | Admitting: *Deleted

## 2019-10-11 ENCOUNTER — Ambulatory Visit (INDEPENDENT_AMBULATORY_CARE_PROVIDER_SITE_OTHER): Payer: Managed Care, Other (non HMO) | Admitting: Urology

## 2019-10-11 VITALS — BP 135/86 | HR 91 | Ht 74.0 in | Wt 201.0 lb

## 2019-10-11 DIAGNOSIS — Z122 Encounter for screening for malignant neoplasm of respiratory organs: Secondary | ICD-10-CM

## 2019-10-11 DIAGNOSIS — N486 Induration penis plastica: Secondary | ICD-10-CM | POA: Diagnosis not present

## 2019-10-11 DIAGNOSIS — Z87891 Personal history of nicotine dependence: Secondary | ICD-10-CM

## 2019-10-11 NOTE — Progress Notes (Signed)
Former, smoker, quit 2020, 51 pack year

## 2019-10-13 ENCOUNTER — Encounter: Payer: Self-pay | Admitting: Urology

## 2019-10-13 NOTE — Progress Notes (Signed)
10/11/2019 12:38 PM   Willie Hess 1965-09-03 [id number]  Referring provider: Delsa Grana, PA-C [address] [place],  [place] [address]  Chief Complaint  Patient presents with  . New Patient (Initial Visit)    Penile curve    HPI: Willie Hess is a 54 y.o. male who presents for evaluation of penile curvature.   1 year history penile curvature  ~30 degree upward curvature  No pain with erection  Curvature has been stable >6 months  No history Dupuytren's contracture or penile fracture  Previously circumcised and feels penile length is shortened with bothersome redundant skin on glans  No hourglass deformity  Past urologic history remarkable for stone disease   PMH: Past Medical History:  Diagnosis Date  . Arthritis   . Benign neoplasm of ascending colon   . Benign neoplasm of cecum   . Benign neoplasm of sigmoid colon   . Benign neoplasm of transverse colon   . Decreased creatinine clearance 12/11/2014  . 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 GERD (gastroesophageal reflux disease)   . GERD without esophagitis 12/11/2014  . Headache    migraines 1x/6 mos (lesser HAs more often)  . History of kidney stones    h/o  . Hx of smoking 07/27/2017  . Lyme disease   . Prediabetes 09/25/2017  . Preventative health care 10/12/2015    Surgical History: Past Surgical History:  Procedure Laterality Date  . COLONOSCOPY WITH PROPOFOL N/A 11/12/2015   Procedure: COLONOSCOPY WITH PROPOFOL;  Surgeon: Lucilla Lame, MD;  Location: [facility];  Service: Endoscopy;  Laterality: N/A;  . ESOPHAGOGASTRODUODENOSCOPY    . INSERTION OF MESH Left 08/24/2017   Procedure: INSERTION OF MESH;  Surgeon: Jules Husbands, MD;  Location: [facility];  Service: General;  Laterality: Left;  . POLYPECTOMY  11/12/2015   Procedure: POLYPECTOMY;  Surgeon: Lucilla Lame, MD;  Location: [facility];  Service: Endoscopy;;  . ROBOT ASSISTED INGUINAL HERNIA REPAIR Left 08/24/2017    Procedure: ROBOT ASSISTED INGUINAL HERNIA REPAIR;  Surgeon: Jules Husbands, MD;  Location: [facility];  Service: General;  Laterality: Left;  . URETEROSCOPY WITH HOLMIUM LASER LITHOTRIPSY Left 09/23/2014   Procedure: URETEROSCOPY, CYSTOSCOPY WITH STENT PLACEMENT, HOLMIUM STAND BY ;  Surgeon: Royston Cowper, MD;  Location: [facility];  Service: Urology;  Laterality: Left;  Marland Kitchen VASECTOMY    . WISDOM TOOTH EXTRACTION      Home Medications:  Allergies as of 10/11/2019      Reactions   Bee Venom Swelling      Medication List       Accurate as of October 11, 2019 11:59 PM. If you have any questions, ask your nurse or doctor.        neomycin-polymyxin-hydrocortisone OTIC solution Commonly known as: CORTISPORIN Place 4 drops into the left ear 4 (four) times daily for 7 days.   Zegerid 20-1100 MG Caps capsule Generic drug: Omeprazole-Sodium Bicarbonate Take 2 capsules by mouth daily.       Allergies:  Allergies  Allergen Reactions  . Bee Venom Swelling    Family History: Family History  Problem Relation Age of Onset  . Thyroid disease Mother   . Heart disease Father   . Heart disease Sister   . Other Brother        Lyme Disease  . Heart disease Paternal Grandmother   . Emphysema Paternal Grandfather     Social History:  reports that he quit smoking about 2 years  ago. His smoking use included cigarettes. He has a 30.00 pack-year smoking history. He has never used smokeless tobacco. He reports that he does not drink alcohol and does not use drugs.   Physical Exam: BP 135/86   Pulse 91   Ht 6\' 2"  (1.88 m)   Wt 201 lb (91.2 kg)   BMI 25.81 kg/m   Constitutional:  Alert and oriented, No acute distress. HEENT: [place] AT, moist mucus membranes.  Trachea midline, no masses. Cardiovascular: No clubbing, cyanosis, or edema. Respiratory: Normal respiratory effort, no increased work of breathing. GI: Abdomen is soft, nontender, nondistended, no abdominal masses GU: Phallus with fairly  extensive dorsal plaque, nontender; stretch penile length normal.  Testes descended bilaterally without masses or tenderness, spermatic cord/epididymis palpably normal bilaterally Skin: No rashes, bruises or suspicious lesions. Neurologic: Grossly intact, no focal deficits, moving all 4 extremities. Psychiatric: Normal mood and affect.   Assessment & Plan:    1.  Peyronie's disease Based on patient's history of physical exam, findings are consistent with Peyronie's disease.  Pathophysiology was discussed at length including the 2 phases of the diease, acute and chronic.  Treatment options and goals of treatment were discussed today in detail. Options including observation, penile plaque and graft, penile plication, placement of penile prosthesis, and injection of collagenase were all reviewed. An benefits of each were discussed at length.  He was given literature on Xiaflex.  He was interested in referral to Dr. Francesca Jewett at [facility] for further discussion.    Abbie Sons, [place] [address], [place],  [address] [phone]

## 2019-10-16 ENCOUNTER — Other Ambulatory Visit: Payer: Self-pay

## 2019-10-16 ENCOUNTER — Ambulatory Visit: Payer: Managed Care, Other (non HMO) | Admitting: Podiatry

## 2019-10-16 ENCOUNTER — Ambulatory Visit (INDEPENDENT_AMBULATORY_CARE_PROVIDER_SITE_OTHER): Payer: Managed Care, Other (non HMO)

## 2019-10-16 ENCOUNTER — Encounter: Payer: Self-pay | Admitting: Podiatry

## 2019-10-16 DIAGNOSIS — M722 Plantar fascial fibromatosis: Secondary | ICD-10-CM | POA: Diagnosis not present

## 2019-10-16 MED ORDER — METHYLPREDNISOLONE 4 MG PO TBPK: ORAL_TABLET | ORAL | 0 refills | Status: DC

## 2019-10-16 MED ORDER — MELOXICAM 15 MG PO TABS: 15.0000 mg | ORAL_TABLET | Freq: Every day | ORAL | 3 refills | Status: DC

## 2019-10-16 NOTE — Patient Instructions (Signed)

## 2019-10-16 NOTE — Progress Notes (Signed)
Subjective:  Patient ID: Willie Hess, male    DOB: 05/03/1965,  MRN: [id number] HPI Chief Complaint  Patient presents with  . Foot Pain    Plantar heel left - severe aching x 1 week, AM pain, no injury, no treatment  . Toe Pain    3rd toe right - concerned it has arthritis, intermittent aching  . New Patient (Initial Visit)    54 y.o. male presents with the above complaint.   ROS: Denies fever chills nausea vomiting muscle aches pains calf pain back pain chest pain shortness of breath.  Past Medical History:  Diagnosis Date  . Arthritis   . Benign neoplasm of ascending colon   . Benign neoplasm of cecum   . Benign neoplasm of sigmoid colon   . Benign neoplasm of transverse colon   . Decreased creatinine clearance 12/11/2014  . 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 GERD (gastroesophageal reflux disease)   . GERD without esophagitis 12/11/2014  . Headache    migraines 1x/6 mos (lesser HAs more often)  . History of kidney stones    h/o  . Hx of smoking 07/27/2017  . Lyme disease   . Prediabetes 09/25/2017  . Preventative health care 10/12/2015   Past Surgical History:  Procedure Laterality Date  . COLONOSCOPY WITH PROPOFOL N/A 11/12/2015   Procedure: COLONOSCOPY WITH PROPOFOL;  Surgeon: Lucilla Lame, MD;  Location: [facility];  Service: Endoscopy;  Laterality: N/A;  . ESOPHAGOGASTRODUODENOSCOPY    . INSERTION OF MESH Left 08/24/2017   Procedure: INSERTION OF MESH;  Surgeon: Jules Husbands, MD;  Location: [facility];  Service: General;  Laterality: Left;  . POLYPECTOMY  11/12/2015   Procedure: POLYPECTOMY;  Surgeon: Lucilla Lame, MD;  Location: [facility];  Service: Endoscopy;;  . ROBOT ASSISTED INGUINAL HERNIA REPAIR Left 08/24/2017   Procedure: ROBOT ASSISTED INGUINAL HERNIA REPAIR;  Surgeon: Jules Husbands, MD;  Location: [facility];  Service: General;  Laterality: Left;  . URETEROSCOPY WITH HOLMIUM LASER LITHOTRIPSY Left 09/23/2014   Procedure: URETEROSCOPY, CYSTOSCOPY WITH  STENT PLACEMENT, HOLMIUM STAND BY ;  Surgeon: Royston Cowper, MD;  Location: [facility];  Service: Urology;  Laterality: Left;  Marland Kitchen VASECTOMY    . WISDOM TOOTH EXTRACTION      Current Outpatient Medications:  .  meloxicam (MOBIC) 15 MG tablet, Take 1 tablet (15 mg total) by mouth daily., Disp: 30 tablet, Rfl: 3 .  methylPREDNISolone (MEDROL DOSEPAK) 4 MG TBPK tablet, 6 day dose pack - take as directed, Disp: 21 tablet, Rfl: 0 .  Omeprazole-Sodium Bicarbonate (ZEGERID) 20-1100 MG CAPS capsule, Take 2 capsules by mouth daily. , Disp: , Rfl:   Allergies  Allergen Reactions  . Bee Venom Swelling   Review of Systems Objective:  There were no vitals filed for this visit.  General: Well developed, nourished, in no acute distress, alert and oriented x3   Dermatological: Skin is warm, dry and supple bilateral. Nails x 10 are well maintained; remaining integument appears unremarkable at this time. There are no open sores, no preulcerative lesions, no rash or signs of infection present.  Vascular: Dorsalis Pedis artery and Posterior Tibial artery pedal pulses are 2/4 bilateral with immedate capillary fill time. Pedal hair growth present. No varicosities and no lower extremity edema present bilateral.   Neruologic: Grossly intact via light touch bilateral. Vibratory intact via tuning fork bilateral. Protective threshold with Semmes Wienstein monofilament intact to all pedal sites bilateral. Patellar and Achilles deep tendon reflexes 2+  bilateral. No Babinski or clonus noted bilateral.   Musculoskeletal: No gross boney pedal deformities bilateral. No pain, crepitus, or limitation noted with foot and ankle range of motion bilateral. Muscular strength 5/5 in all groups tested bilateral.  He has a Heberden's node to the third digit of the right foot.  This is consistent with osteoarthritis.  He also has pain on palpation medial calcaneal tubercle of the left foot.  Some osteoarthritic changes at the PIPJ and  DIPJ of the second toe as well.  Gait: Unassisted, Nonantalgic.    Radiographs:  Radiographs taken today demonstrate soft tissue increase in density plantar fascial kidney insertion site of the left heel.  No significant osseous abnormalities otherwise.  No acute findings.  Assessment & Plan:   Assessment: Plantar fasciitis primary diagnosis left.  Plan: Discussed etiology pathology conservative versus surgical therapies at this point start him on Medrol Dosepak to be followed by meloxicam.  Also injected the left heel today 20 mg Kenalog 5 mg Marcaine placed in a plantar fascial brace and a night splint.  Discussed appropriate shoe gear stretching exercise ice therapy and shoe gear modifications we will follow-up with him in 1 month.     Izekiel Flegel T. [place], [place]

## 2019-10-28 ENCOUNTER — Other Ambulatory Visit: Payer: Self-pay

## 2019-10-28 ENCOUNTER — Other Ambulatory Visit: Payer: Managed Care, Other (non HMO)

## 2019-10-28 DIAGNOSIS — Z20822 Contact with and (suspected) exposure to covid-19: Secondary | ICD-10-CM

## 2019-10-29 ENCOUNTER — Encounter: Payer: Self-pay | Admitting: *Deleted

## 2019-10-29 LAB — NOVEL CORONAVIRUS, NAA: SARS-CoV-2, NAA: NOT DETECTED

## 2019-10-29 LAB — SARS-COV-2, NAA 2 DAY TAT

## 2019-10-31 ENCOUNTER — Ambulatory Visit
Admission: RE | Admit: 2019-10-31 | Discharge: 2019-10-31 | Disposition: A | Discharge status: Home / Self Care | Payer: Managed Care, Other (non HMO) | Source: Ambulatory Visit | Attending: Oncology | Admitting: Oncology

## 2019-10-31 ENCOUNTER — Inpatient Hospital Stay (HOSPITAL_BASED_OUTPATIENT_CLINIC_OR_DEPARTMENT_OTHER): Payer: Managed Care, Other (non HMO) | Admitting: Nurse Practitioner

## 2019-10-31 ENCOUNTER — Other Ambulatory Visit: Payer: Self-pay

## 2019-10-31 ENCOUNTER — Encounter: Payer: Self-pay | Admitting: Nurse Practitioner

## 2019-10-31 ENCOUNTER — Ambulatory Visit: Payer: Managed Care, Other (non HMO)

## 2019-10-31 DIAGNOSIS — Z87891 Personal history of nicotine dependence: Secondary | ICD-10-CM | POA: Insufficient documentation

## 2019-10-31 DIAGNOSIS — Z122 Encounter for screening for malignant neoplasm of respiratory organs: Secondary | ICD-10-CM | POA: Diagnosis present

## 2019-10-31 NOTE — Progress Notes (Signed)
Virtual Visit via Video Enabled Telemedicine Note   I connected with Willie Hess on 10/31/19 at 11:30 AM EST by video enabled telemedicine visit and verified that I am speaking with the correct person using two identifiers.   I discussed the limitations, risks, security and privacy concerns of performing an evaluation and management service by telemedicine and the availability of in-person appointments. I also discussed with the patient that there may be a patient responsible charge related to this service. The patient expressed understanding and agreed to proceed.   Other persons participating in the visit and their role in the encounter: Burgess Estelle, RN- checking in patient & navigation  Patient's location: home  Provider's location: Clinic  Chief Complaint: Low Dose CT Screening  Patient agreed to evaluation by telemedicine to discuss shared decision making for consideration of low dose CT lung cancer screening.    In accordance with CMS guidelines, patient has met eligibility criteria including age, absence of signs or symptoms of lung cancer.  Social History   Tobacco Use  . Smoking status: Former Smoker    Packs/day: 1.00    Years: 51.00    Pack years: 51.00    Types: Cigarettes    Quit date: 2020    Years since quitting: 1.7  . Smokeless tobacco: Never Used  Substance Use Topics  . Alcohol use: No     A shared decision-making session was conducted prior to the performance of CT scan. This includes one or more decision aids, includes benefits and harms of screening, follow-up diagnostic testing, over-diagnosis, false positive rate, and total radiation exposure.   Counseling on the importance of adherence to annual lung cancer LDCT screening, impact of co-morbidities, and ability or willingness to undergo diagnosis and treatment is imperative for compliance of the program.   Counseling on the importance of continued smoking cessation for former smokers; the importance of  smoking cessation for current smokers, and information about tobacco cessation interventions have been given to patient including [place] and [employer] [place] programs.   Written order for lung cancer screening with LDCT has been given to the patient and any and all questions have been answered to the best of my abilities.    Yearly follow up will be coordinated by Burgess Estelle, Thoracic Navigator.  I discussed the assessment and treatment plan with the patient. The patient was provided an opportunity to ask questions and all were answered. The patient agreed with the plan and demonstrated an understanding of the instructions.   The patient was advised to call back or seek an in-person evaluation if the symptoms worsen or if the condition fails to improve as anticipated.   I provided 15 minutes of face-to-face video visit time during this encounter, and > 50% was spent counseling as documented under my assessment & plan.   Beckey Rutter, DNP, AGNP-C [facility] [phone] (clinic)'

## 2019-11-05 ENCOUNTER — Encounter: Payer: Self-pay | Admitting: *Deleted

## 2019-11-20 ENCOUNTER — Other Ambulatory Visit: Payer: Self-pay

## 2019-11-20 ENCOUNTER — Encounter: Payer: Self-pay | Admitting: Podiatry

## 2019-11-20 ENCOUNTER — Ambulatory Visit (INDEPENDENT_AMBULATORY_CARE_PROVIDER_SITE_OTHER): Payer: Managed Care, Other (non HMO) | Admitting: Podiatry

## 2019-11-20 DIAGNOSIS — M722 Plantar fascial fibromatosis: Secondary | ICD-10-CM | POA: Diagnosis not present

## 2019-11-20 DIAGNOSIS — Q666 Other congenital valgus deformities of feet: Secondary | ICD-10-CM

## 2019-11-20 NOTE — Progress Notes (Signed)
[name] presents today for follow-up of his plan fasciitis states that is really not any better.  He states that the medication worked well with it for several days and then once he is back on his feet at work it was just as bad as it was initially.  He states that he would like to have some type of medication and something that would help him stand on his feet longer at work.  Objective: Vital signs are stable he is alert oriented x3 pulses are palpable.  There is no erythema edema cellulitis drainage or odor he has pain on palpation medial calcaneal tubercle left foot.  Assessment: Plantar fasciitis left.  Plan: Reinjected the left heel today 20 mg Kenalog 5 mg Marcaine point maximal tenderness.  I I also had him see Liliane Channel for orthotics.

## 2019-12-18 ENCOUNTER — Other Ambulatory Visit: Payer: Self-pay

## 2019-12-18 ENCOUNTER — Ambulatory Visit (INDEPENDENT_AMBULATORY_CARE_PROVIDER_SITE_OTHER): Payer: Managed Care, Other (non HMO) | Admitting: Orthotics

## 2019-12-18 DIAGNOSIS — Q666 Other congenital valgus deformities of feet: Secondary | ICD-10-CM

## 2019-12-18 DIAGNOSIS — M722 Plantar fascial fibromatosis: Secondary | ICD-10-CM

## 2019-12-18 NOTE — Progress Notes (Signed)
Patient p/up f/o...reported no issues.Willie Hess

## 2020-04-09 ENCOUNTER — Encounter: Payer: Self-pay | Admitting: Family Medicine

## 2020-04-09 ENCOUNTER — Other Ambulatory Visit: Payer: Self-pay

## 2020-04-09 ENCOUNTER — Ambulatory Visit (INDEPENDENT_AMBULATORY_CARE_PROVIDER_SITE_OTHER): Payer: Managed Care, Other (non HMO) | Admitting: Family Medicine

## 2020-04-09 VITALS — BP 120/82 | HR 94 | Temp 98.3°F | Resp 16 | Ht 74.0 in | Wt 209.3 lb

## 2020-04-09 DIAGNOSIS — E785 Hyperlipidemia, unspecified: Secondary | ICD-10-CM | POA: Diagnosis not present

## 2020-04-09 DIAGNOSIS — K219 Gastro-esophageal reflux disease without esophagitis: Secondary | ICD-10-CM

## 2020-04-09 DIAGNOSIS — J310 Chronic rhinitis: Secondary | ICD-10-CM | POA: Diagnosis not present

## 2020-04-09 DIAGNOSIS — R7303 Prediabetes: Secondary | ICD-10-CM

## 2020-04-09 DIAGNOSIS — E663 Overweight: Secondary | ICD-10-CM

## 2020-04-09 DIAGNOSIS — U071 COVID-19: Secondary | ICD-10-CM | POA: Diagnosis not present

## 2020-04-09 DIAGNOSIS — J069 Acute upper respiratory infection, unspecified: Secondary | ICD-10-CM

## 2020-04-09 DIAGNOSIS — J329 Chronic sinusitis, unspecified: Secondary | ICD-10-CM

## 2020-04-09 DIAGNOSIS — Z5181 Encounter for therapeutic drug level monitoring: Secondary | ICD-10-CM

## 2020-04-09 NOTE — Progress Notes (Signed)
Patient ID: Willie Hess, male    DOB: 03-Jun-1965, 55 y.o.   MRN: [id number]  PCP: Delsa Grana, PA-C  Chief Complaint  Patient presents with  . Follow-up    6 month   . Hyperlipidemia    Subjective:   Willie Hess is a 55 y.o. male, presents to clinic with CC of the following:  HPI  Here for 6 month f/up  COVID 2 weeks ago Sx onset 03/27/2020, test positive on 2/13 Mostly had body aches, fatigue, loss of taste/smell, some DOE- simple activity feels like he just sprinted Over all feeling much better no CP, SOB, fever, HA near syncope   Hyperlipidemia: Not on meds Last Lipids: Lab Results  Component Value Date   CHOL 232 (H) 10/03/2018   HDL 43 10/03/2018   LDLCALC 156 (H) 10/03/2018   TRIG 166 (H) 10/03/2018   CHOLHDL 5.4 (H) 10/03/2018   - Denies: Chest pain, shortness of breath, myalgias, claudication The 10-year ASCVD risk score Mikey Bussing DC Jr., et al., 2013) is: 6.3%   Values used to calculate the score:     Age: 68 years     Sex: Male     Is Non-Hispanic African American: No     Diabetic: No     Tobacco smoker: No     Systolic Blood Pressure: 948 mmHg     Is BP treated: No     HDL Cholesterol: 43 mg/dL     Total Cholesterol: 232 mg/dL  Diet - eats whatever he wants, cheeseburger, pizza Could work on diet Fairly physical work at job  [employer] up a little  Wt Readings from Last 5 Encounters:  04/09/20 209 lb 4.8 oz (94.9 kg)  10/31/19 202 lb (91.6 kg)  10/11/19 201 lb (91.2 kg)  10/07/19 208 lb 12.8 oz (94.7 kg)  10/03/18 199 lb 8 oz (90.5 kg)   BMI Readings from Last 5 Encounters:  04/09/20 26.87 kg/m  10/31/19 25.94 kg/m  10/11/19 25.81 kg/m  10/07/19 26.81 kg/m  10/03/18 25.35 kg/m    Hx of prediabetes  Lab Results  Component Value Date   HGBA1C 5.8 (H) 09/20/2017  08/25/2016 5.7        Patient Active Problem List   Diagnosis Date Noted  . Prediabetes 09/25/2017  . Hx of smoking 07/27/2017  . Benign neoplasm of cecum   .  Benign neoplasm of ascending colon   . Benign neoplasm of transverse colon   . Benign neoplasm of sigmoid colon   . GERD without esophagitis 12/11/2014      Current Outpatient Medications:  .  Omeprazole-Sodium Bicarbonate (ZEGERID) 20-1100 MG CAPS capsule, Take 2 capsules by mouth daily. , Disp: , Rfl:  .  meloxicam (MOBIC) 15 MG tablet, Take 1 tablet (15 mg total) by mouth daily. (Patient not taking: Reported on 04/09/2020), Disp: 30 tablet, Rfl: 3   Allergies  Allergen Reactions  . Bee Venom Swelling     Social History   Tobacco Use  . Smoking status: Former Smoker    Packs/day: 1.00    Years: 51.00    Pack years: 51.00    Types: Cigarettes    Quit date: 2020    Years since quitting: 2.1  . Smokeless tobacco: Never Used  Vaping Use  . Vaping Use: Never used  Substance Use Topics  . Alcohol use: No  . Drug use: No      Chart Review Today: I personally reviewed active problem list, medication list, allergies,  family history, social history, health maintenance, notes from last encounter, lab results, imaging with the patient/caregiver today.   Review of Systems  Constitutional: Negative.   HENT: Negative.   Eyes: Negative.   Respiratory: Negative.   Cardiovascular: Negative.   Gastrointestinal: Negative.   Endocrine: Negative.   Genitourinary: Negative.   Musculoskeletal: Negative.   Skin: Negative.   Allergic/Immunologic: Negative.   Neurological: Negative.   Hematological: Negative.   Psychiatric/Behavioral: Negative.   All other systems reviewed and are negative.      Objective:   Vitals:   04/09/20 0844  BP: 120/82  Pulse: 94  Resp: 16  Temp: 98.3 F (36.8 C)  SpO2: 99%  Weight: 209 lb 4.8 oz (94.9 kg)  Height: 6\' 2"  (1.88 m)    Body mass index is 26.87 kg/m.  Physical Exam Vitals and nursing note reviewed.  Constitutional:      General: He is not in acute distress.    Appearance: Normal appearance. He is well-developed. He is not  ill-appearing, toxic-appearing or diaphoretic.     Interventions: Face mask in place.  HENT:     Head: Normocephalic and atraumatic.     Jaw: No trismus.     Right Ear: External ear normal.     Left Ear: External ear normal.     Nose: Mucosal edema, congestion and rhinorrhea present.     Right Turbinates: Swollen.     Left Turbinates: Swollen.     Right Sinus: No maxillary sinus tenderness or frontal sinus tenderness.     Left Sinus: No maxillary sinus tenderness.  Eyes:     General: Lids are normal. No scleral icterus.       Right eye: No discharge.        Left eye: No discharge.     Conjunctiva/sclera: Conjunctivae normal.  Neck:     Trachea: Trachea and phonation normal. No tracheal deviation.  Cardiovascular:     Rate and Rhythm: Normal rate and regular rhythm.     Pulses: Normal pulses.          Radial pulses are 2+ on the right side and 2+ on the left side.       Posterior tibial pulses are 2+ on the right side and 2+ on the left side.     Heart sounds: Normal heart sounds. No murmur heard. No friction rub. No gallop.   Pulmonary:     Effort: Pulmonary effort is normal. No respiratory distress.     Breath sounds: Normal breath sounds. No stridor. No wheezing, rhonchi or rales.  Abdominal:     General: Bowel sounds are normal. There is no distension.     Palpations: Abdomen is soft.  Musculoskeletal:     Right lower leg: No edema.     Left lower leg: No edema.  Skin:    General: Skin is warm and dry.     Coloration: Skin is not jaundiced.     Findings: No rash.     Nails: There is no clubbing.  Neurological:     Mental Status: He is alert. Mental status is at baseline.     Cranial Nerves: No dysarthria or facial asymmetry.     Motor: No tremor or abnormal muscle tone.     Gait: Gait normal.  Psychiatric:        Mood and Affect: Mood normal.        Speech: Speech normal.        Behavior: Behavior normal. Behavior is  cooperative.      Results for orders placed  or performed in visit on 10/28/19  Novel Coronavirus, NAA ([facility])   Specimen: Nasopharyngeal(NP) swabs in vial transport medium   Nasopharynge  Screenin  Result Value Ref Range   SARS-CoV-2, NAA Not Detected Not Detected  SARS-COV-2, NAA 2 DAY TAT   Nasopharynge  Screenin  Result Value Ref Range   SARS-CoV-2, NAA 2 DAY TAT Performed        Assessment & Plan:     ICD-10-CM   1. Hyperlipidemia, unspecified hyperlipidemia type  E78.5 Lipid panel    COMPLETE METABOLIC PANEL WITH GFR    Comprehensive metabolic panel    Lipid panel   not on meds, no current concerning sx, not working on diet, due for labs, discussed indications to start meds, recommended first working on diet  2. Prediabetes  R73.03 Lipid panel    Hemoglobin A1C    Comprehensive metabolic panel    Hemoglobin A1c   recheck lab, weight up and no diet efforts - discussed possibility to develop T2DM - encouraged to work on diet/lifestyle to remain healthy  3. Rhinosinusitis  J31.0    J32.9    some lingering signs and sx of URI - encouraged him to use antihistamines, steroid nasal sprays, decongestants if needed  4. Upper respiratory tract infection due to COVID-19 virus  U07.1    J06.9    see above, most sx improving  5. Encounter for medication monitoring  Z51.81 Lipid panel    Hemoglobin A1C    COMPLETE METABOLIC PANEL WITH GFR    CBC with Differential/Platelet    Comprehensive metabolic panel    CBC with Differential/Platelet    Lipid panel    Hemoglobin A1c  6. GERD without esophagitis  K21.9    currently well controlled  7. Overweight (BMI 25.0-29.9)  E66.3    weight up a little, pt not doing anything to work on diet - considering some changes        Delsa Grana, PA-C 04/09/20 9:15 AM

## 2020-04-10 LAB — COMPREHENSIVE METABOLIC PANEL
ALT: 33 IU/L (ref 0–44)
AST: 23 IU/L (ref 0–40)
Albumin/Globulin Ratio: 1.4 (ref 1.2–2.2)
Albumin: 4.1 g/dL (ref 3.8–4.9)
Alkaline Phosphatase: 62 IU/L (ref 44–121)
BUN/Creatinine Ratio: 13 (ref 9–20)
BUN: 13 mg/dL (ref 6–24)
Bilirubin Total: 0.4 mg/dL (ref 0.0–1.2)
CO2: 23 mmol/L (ref 20–29)
Calcium: 9.6 mg/dL (ref 8.7–10.2)
Chloride: 100 mmol/L (ref 96–106)
Creatinine, Ser: 1.01 mg/dL (ref 0.76–1.27)
GFR calc Af Amer: 97 mL/min/{1.73_m2} (ref >59)
GFR calc non Af Amer: 84 mL/min/{1.73_m2} (ref >59)
Globulin, Total: 2.9 g/dL (ref 1.5–4.5)
Glucose: 83 mg/dL (ref 65–99)
Potassium: 4.8 mmol/L (ref 3.5–5.2)
Sodium: 137 mmol/L (ref 134–144)
Total Protein: 7 g/dL (ref 6.0–8.5)

## 2020-04-10 LAB — CBC WITH DIFFERENTIAL/PLATELET
Basophils Absolute: 0.1 10*3/uL (ref 0.0–0.2)
Basos: 1 %
EOS (ABSOLUTE): 0.1 10*3/uL (ref 0.0–0.4)
Eos: 1 %
Hematocrit: 49.1 % (ref 37.5–51.0)
Hemoglobin: 17 g/dL (ref 13.0–17.7)
Immature Grans (Abs): 0 10*3/uL (ref 0.0–0.1)
Immature Granulocytes: 1 %
Lymphocytes Absolute: 2.3 10*3/uL (ref 0.7–3.1)
Lymphs: 30 %
MCH: 32.8 pg (ref 26.6–33.0)
MCHC: 34.6 g/dL (ref 31.5–35.7)
MCV: 95 fL (ref 79–97)
Monocytes Absolute: 0.8 10*3/uL (ref 0.1–0.9)
Monocytes: 10 %
Neutrophils Absolute: 4.6 10*3/uL (ref 1.4–7.0)
Neutrophils: 57 %
Platelets: 276 10*3/uL (ref 150–450)
RBC: 5.19 x10E6/uL (ref 4.14–5.80)
RDW: 13.3 % (ref 11.6–15.4)
WBC: 7.8 10*3/uL (ref 3.4–10.8)

## 2020-04-10 LAB — LIPID PANEL
Chol/HDL Ratio: 4.3 ratio (ref 0.0–5.0)
Cholesterol, Total: 248 mg/dL — ABNORMAL HIGH (ref 100–199)
HDL: 58 mg/dL (ref >39)
LDL Chol Calc (NIH): 153 mg/dL — ABNORMAL HIGH (ref 0–99)
Triglycerides: 207 mg/dL — ABNORMAL HIGH (ref 0–149)
VLDL Cholesterol Cal: 37 mg/dL (ref 5–40)

## 2020-04-10 LAB — HEMOGLOBIN A1C
Est. average glucose Bld gHb Est-mCnc: 120 mg/dL
Hgb A1c MFr Bld: 5.8 % — ABNORMAL HIGH (ref 4.8–5.6)

## 2020-04-28 DIAGNOSIS — E785 Hyperlipidemia, unspecified: Secondary | ICD-10-CM | POA: Insufficient documentation

## 2020-06-22 ENCOUNTER — Other Ambulatory Visit: Payer: Self-pay | Admitting: Family Medicine

## 2020-06-22 ENCOUNTER — Ambulatory Visit (INDEPENDENT_AMBULATORY_CARE_PROVIDER_SITE_OTHER): Payer: Managed Care, Other (non HMO) | Admitting: Family Medicine

## 2020-06-22 ENCOUNTER — Other Ambulatory Visit: Payer: Self-pay

## 2020-06-22 ENCOUNTER — Encounter: Payer: Self-pay | Admitting: Family Medicine

## 2020-06-22 VITALS — BP 138/84 | HR 88 | Temp 97.9°F | Resp 16 | Ht 74.0 in | Wt 208.0 lb

## 2020-06-22 DIAGNOSIS — R7303 Prediabetes: Secondary | ICD-10-CM

## 2020-06-22 DIAGNOSIS — R5383 Other fatigue: Secondary | ICD-10-CM

## 2020-06-22 DIAGNOSIS — Z8616 Personal history of COVID-19: Secondary | ICD-10-CM | POA: Diagnosis not present

## 2020-06-22 DIAGNOSIS — E785 Hyperlipidemia, unspecified: Secondary | ICD-10-CM

## 2020-06-22 DIAGNOSIS — R61 Generalized hyperhidrosis: Secondary | ICD-10-CM | POA: Diagnosis not present

## 2020-06-22 LAB — GLUCOSE, POCT (MANUAL RESULT ENTRY): POC Glucose: 110 mg/dl — AB (ref 70–99)

## 2020-06-22 MED ORDER — BLOOD GLUCOSE METER KIT: PACK | 0 refills | Status: DC

## 2020-06-22 MED ORDER — ACCU-CHEK GUIDE ME W/DEVICE KIT: 1.0000 | PACK | Freq: Once | 0 refills | Status: DC

## 2020-06-22 NOTE — Progress Notes (Signed)
Name: Willie Hess   MRN: [id number]    DOB: 1965-03-14   Date:06/22/2020       Progress Note  Subjective  Chief Complaint  Night Sweats/ Excessive Thirst  HPI  Pre-diabetes: he states since his last visit he has not feeling well, he states he has noticed increase in fatigue, also as nigh sweats, sometimes at the end of the work day he is loopy and some co-workers have noticed he has slurred speech at the end of the day. He states night sweats severe over the past 2 weeks. His A1C done in Feb was in the pre-diabetes range. He eats breakfast around 9 am - while at work - skips lunch at times, usually eats fast food except for dinner, he eats at home.   History of COVID-19: he had COVID-19 mid Feb, he states symptoms of fatigue, feeling sluggish at the end of the day, started about one month later.  Discussed long haul covid and if changing diet does not improve symptoms we can refer him to long haul covid clinic . He states it has been so different that his co-workers asked if he was drinking alcohol at work. 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 He took this week off to take care of himself.   Dyslipidemia:: discussed life style modification   The 10-year ASCVD risk score Mikey Bussing DC Jr., et al., 2013) is: 6.6%   Values used to calculate the score:     Age: 20 years     Sex: Male     Is Non-Hispanic African American: No     Diabetic: No     Tobacco smoker: No     Systolic Blood Pressure: 510 mmHg     Is BP treated: No     HDL Cholesterol: 58 mg/dL     Total Cholesterol: 248 mg/dL  Patient Active Problem List   Diagnosis Date Noted  . Hyperlipidemia 04/28/2020  . Prediabetes 09/25/2017  . Hx of smoking 07/27/2017  . Benign neoplasm of cecum   . Benign  neoplasm of ascending colon   . Benign neoplasm of transverse colon   . Benign neoplasm of sigmoid colon   . GERD without esophagitis 12/11/2014    Past Surgical History:  Procedure Laterality Date  . COLONOSCOPY WITH PROPOFOL N/A 11/12/2015   Procedure: COLONOSCOPY WITH PROPOFOL;  Surgeon: Lucilla Lame, MD;  Location: [facility];  Service: Endoscopy;  Laterality: N/A;  . ESOPHAGOGASTRODUODENOSCOPY    . INSERTION OF MESH Left 08/24/2017   Procedure: INSERTION OF MESH;  Surgeon: Jules Husbands, MD;  Location: [facility];  Service: General;  Laterality: Left;  . POLYPECTOMY  11/12/2015   Procedure: POLYPECTOMY;  Surgeon: Lucilla Lame, MD;  Location: [facility];  Service: Endoscopy;;  . ROBOT ASSISTED INGUINAL HERNIA REPAIR Left 08/24/2017   Procedure: ROBOT ASSISTED INGUINAL HERNIA REPAIR;  Surgeon: Jules Husbands, MD;  Location: [facility];  Service: General;  Laterality: Left;  . URETEROSCOPY WITH HOLMIUM LASER LITHOTRIPSY Left 09/23/2014   Procedure: URETEROSCOPY, CYSTOSCOPY WITH STENT PLACEMENT, HOLMIUM STAND BY ;  Surgeon: Royston Cowper, MD;  Location: [facility];  Service: Urology;  Laterality: Left;  Marland Kitchen VASECTOMY    . WISDOM TOOTH EXTRACTION  Family History  Problem Relation Age of Onset  . Thyroid disease Mother   . Heart disease Father   . Heart disease Sister   . Other Brother        Lyme Disease  . Heart disease Paternal Grandmother   . Emphysema Paternal Grandfather     Social History   Tobacco Use  . Smoking status: Former Smoker    Packs/day: 1.00    Years: 51.00    Pack years: 51.00    Types: Cigarettes    Quit date: 2020    Years since quitting: 2.3  . Smokeless tobacco: Never Used  Substance Use Topics  . Alcohol use: No     Current Outpatient Medications:  .  Omeprazole-Sodium Bicarbonate (ZEGERID) 20-1100 MG CAPS capsule, Take 2 capsules by mouth daily. , Disp: , Rfl:   Allergies  Allergen Reactions  . Bee Venom Swelling    I  personally reviewed active problem list, medication list, allergies, family history, social history, health maintenance with the patient/caregiver today.   ROS  Constitutional: Negative for fever or weight change.  Respiratory: Negative for cough and shortness of breath.   Cardiovascular: Negative for chest pain or palpitations.  Gastrointestinal: Negative for abdominal pain, no bowel changes., positive for intermittent nausea   Musculoskeletal: Negative for gait problem or joint swelling.  Skin: Negative for rash.  Neurological:negative   for dizziness but no  headache.  No other specific complaints in a complete review of systems (except as listed in HPI above).  Objective  Vitals:   06/22/20 0826  BP: 138/84  Pulse: 88  Resp: 16  Temp: 97.9 F (36.6 C)  TempSrc: Oral  SpO2: 99%  Weight: 208 lb (94.3 kg)  Height: _0  (1.88 m)    Body mass index is 26.71 kg/m.  Physical Exam  Constitutional: Patient appears well-developed and well-nourished. No distress.  HEENT: head atraumatic, normocephalic, pupils equal and reactive to light,neck supple Cardiovascular: Normal rate, regular rhythm and normal heart sounds.  No murmur heard. No BLE edema. Pulmonary/Chest: Effort normal and breath sounds normal. No respiratory distress. Abdominal: Soft.  There is no tenderness. Psychiatric: Patient has a normal mood and affect. behavior is normal. Judgment and thought content normal.  Recent Results (from the past 2160 hour(s))  Comprehensive metabolic panel     Status: None   Collection Time: 04/09/20 12:00 AM  Result Value Ref Range   Glucose 83 65 - 99 mg/dL   BUN 13 6 - 24 mg/dL   Creatinine, Ser 1.01 0.76 - 1.27 mg/dL    Comment:                **Effective April 13, 2020 [facility] will begin**                  reporting the 2021 CKD-EPI creatinine equation that                  estimates kidney function without a race variable.    GFR calc non Af Amer 84 >59 mL/min/1.73    GFR calc Af Amer 97 >59 mL/min/1.73    Comment: **In accordance with recommendations from the NKF-ASN Task force,**   [facility] is in the process of updating its eGFR calculation to the   2021 CKD-EPI creatinine equation that estimates kidney function   without a race variable.    BUN/Creatinine Ratio 13 9 - 20   Sodium 137 134 - 144 mmol/L   Potassium 4.8 3.5 -  5.2 mmol/L   Chloride 100 96 - 106 mmol/L   CO2 23 20 - 29 mmol/L   Calcium 9.6 8.7 - 10.2 mg/dL   Total Protein 7.0 6.0 - 8.5 g/dL   Albumin 4.1 3.8 - 4.9 g/dL   Globulin, Total 2.9 1.5 - 4.5 g/dL   Albumin/Globulin Ratio 1.4 1.2 - 2.2   Bilirubin Total 0.4 0.0 - 1.2 mg/dL   Alkaline Phosphatase 62 44 - 121 IU/L   AST 23 0 - 40 IU/L   ALT 33 0 - 44 IU/L  CBC with Differential/Platelet     Status: None   Collection Time: 04/09/20 12:00 AM  Result Value Ref Range   WBC 7.8 3.4 - 10.8 x10E3/uL   RBC 5.19 4.14 - 5.80 x10E6/uL   Hemoglobin 17.0 13.0 - 17.7 g/dL   Hematocrit 49.1 37.5 - 51.0 %   MCV 95 79 - 97 fL   MCH 32.8 26.6 - 33.0 pg   MCHC 34.6 31.5 - 35.7 g/dL   RDW 13.3 11.6 - 15.4 %   Platelets 276 150 - 450 x10E3/uL   Neutrophils 57 Not Estab. %   Lymphs 30 Not Estab. %   Monocytes 10 Not Estab. %   Eos 1 Not Estab. %   Basos 1 Not Estab. %   Neutrophils Absolute 4.6 1.4 - 7.0 x10E3/uL   Lymphocytes Absolute 2.3 0.7 - 3.1 x10E3/uL   Monocytes Absolute 0.8 0.1 - 0.9 x10E3/uL   EOS (ABSOLUTE) 0.1 0.0 - 0.4 x10E3/uL   Basophils Absolute 0.1 0.0 - 0.2 x10E3/uL   Immature Granulocytes 1 Not Estab. %   Immature Grans (Abs) 0.0 0.0 - 0.1 x10E3/uL  Lipid panel     Status: Abnormal   Collection Time: 04/09/20 12:00 AM  Result Value Ref Range   Cholesterol, Total 248 (H) 100 - 199 mg/dL   Triglycerides 207 (H) 0 - 149 mg/dL   HDL 58 >39 mg/dL   VLDL Cholesterol Cal 37 5 - 40 mg/dL   LDL Chol Calc (NIH) 153 (H) 0 - 99 mg/dL   Chol/HDL Ratio 4.3 0.0 - 5.0 ratio    Comment:                                   T.  Chol/HDL Ratio                                             Men  Women                               1/2 Avg.Risk  3.4    3.3                                   Avg.Risk  5.0    4.4                                2X Avg.Risk  9.6    7.1  3X Avg.Risk 23.4   11.0   Hemoglobin A1c     Status: Abnormal   Collection Time: 04/09/20 12:00 AM  Result Value Ref Range   Hgb A1c MFr Bld 5.8 (H) 4.8 - 5.6 %    Comment:          Prediabetes: 5.7 - 6.4          Diabetes: >6.4          Glycemic control for adults with diabetes: <7.0    Est. average glucose Bld gHb Est-mCnc 120 mg/dL      PHQ2/9: Depression screen [facility] 2/9 06/22/2020 04/09/2020 10/07/2019 10/03/2018 07/03/2018  Decreased Interest 0 0 0 0 0  Down, Depressed, Hopeless 0 0 0 0 0  PHQ - 2 Score 0 0 0 0 0  Altered sleeping - 0 0 0 0  Tired, decreased energy - 0 0 0 0  Change in appetite - 0 0 0 0  Feeling bad or failure about yourself  - 0 0 0 0  Trouble concentrating - 0 0 0 0  Moving slowly or fidgety/restless - 0 0 0 0  Suicidal thoughts - 0 0 0 0  PHQ-9 Score - 0 0 0 0  Difficult doing work/chores - Not difficult at all Not difficult at all Not difficult at all Not difficult at all    phq 9 is negative   Fall Risk: Fall Risk  06/22/2020 04/09/2020 10/07/2019 10/03/2018 07/03/2018  Falls in the past year? 0 0 0 0 0  Number falls in past yr: 0 0 0 0 0  Injury with Fall? 0 0 0 0 0  Follow up - - Falls evaluation completed - -     Functional Status Survey: Is the patient deaf or have difficulty hearing?: Yes Does the patient have difficulty seeing, even when wearing glasses/contacts?: No Does the patient have difficulty concentrating, remembering, or making decisions?: Yes Does the patient have difficulty walking or climbing stairs?: No Does the patient have difficulty dressing or bathing?: No Does the patient have difficulty doing errands alone such as visiting a doctor's office or shopping?:  No    Assessment & Plan  1. Night sweats  - TSH - Sedimentation rate - C-reactive protein - Comprehensive metabolic panel - CBC with Differential/Platelet  2. History of COVID-19  - Sedimentation rate - C-reactive protein  3. Dyslipidemia   4. Prediabetes  - POCT Glucose (CBG) - blood glucose meter kit and supplies; Dispense based on patient and insurance preference. Use up to four times daily as directed. (FOR ICD-10 E10.9, E11.9).  Dispense: 1 each; Refill: 0 - Hemoglobin A1c  5. Other fatigue  - Sedimentation rate - C-reactive protein - Comprehensive metabolic panel - CBC with Differential/Platelet  5. Other fatigue  - Sedimentation rate - C-reactive protein - Comprehensive metabolic panel - CBC with Differential/Platelet - Vitamin B12 - VITAMIN D 25 Hydroxy (Vit-D Deficiency, Fractures)

## 2020-06-22 NOTE — Telephone Encounter (Signed)
Last seen: 5.9.2022 by dr Ancil Boozer Next appt: 8.9.2022 by Delsa Grana

## 2020-06-22 NOTE — Telephone Encounter (Signed)
Requested medication (s) are due for refill today:   Requested medication (s) are on the active medication list:   Last refill:  06/22/20  Future visit scheduled:  Notes to clinic:  Pharmacy asking for separate prescription for lancets and test strips.    Requested Prescriptions  Pending Prescriptions Disp Refills   Blood Glucose Monitoring Suppl (ACCU-CHEK GUIDE ME) w/Device KIT [Pharmacy Med Name: ACCU-CHEK GUIDE ME GLUCOSE MTR]  0    Sig: USE AS DIRECTED      Endocrinology: Diabetes - Testing Supplies Passed - 06/22/2020  3:22 PM      Passed - Valid encounter within last 12 months    Recent Outpatient Visits           Today Night sweats   [facility] Steele Sizer, MD   2 months ago Hyperlipidemia, unspecified hyperlipidemia type   [facility] Delsa Grana, PA-C   8 months ago Adult general medical exam   [facility] Delsa Grana, PA-C   1 year ago Preventative health care   [facility] Fredderick Severance, NP   1 year ago Lyme disease   [place], Bethel Born, NP       Future Appointments             In 3 months Delsa Grana, PA-C [facility], [place]   In 3 months Delsa Grana, PA-C [facility], [place]

## 2020-06-22 NOTE — Patient Instructions (Signed)
Fasting normal is below 100 Fasting for pre-diabetes is below 125 Fasting above 126 is diabetes  2 hours after meals should not be over 180   Low glucose is below 80

## 2020-06-23 LAB — CBC WITH DIFFERENTIAL/PLATELET
Basophils Absolute: 0.1 10*3/uL (ref 0.0–0.2)
Basos: 1 %
EOS (ABSOLUTE): 0.1 10*3/uL (ref 0.0–0.4)
Eos: 1 %
Hematocrit: 47.7 % (ref 37.5–51.0)
Hemoglobin: 16.8 g/dL (ref 13.0–17.7)
Immature Grans (Abs): 0 10*3/uL (ref 0.0–0.1)
Immature Granulocytes: 1 %
Lymphocytes Absolute: 1.8 10*3/uL (ref 0.7–3.1)
Lymphs: 25 %
MCH: 33.7 pg — ABNORMAL HIGH (ref 26.6–33.0)
MCHC: 35.2 g/dL (ref 31.5–35.7)
MCV: 96 fL (ref 79–97)
Monocytes Absolute: 0.7 10*3/uL (ref 0.1–0.9)
Monocytes: 9 %
Neutrophils Absolute: 4.7 10*3/uL (ref 1.4–7.0)
Neutrophils: 63 %
Platelets: 259 10*3/uL (ref 150–450)
RBC: 4.99 x10E6/uL (ref 4.14–5.80)
RDW: 13.6 % (ref 11.6–15.4)
WBC: 7.4 10*3/uL (ref 3.4–10.8)

## 2020-06-23 LAB — SEDIMENTATION RATE: Sed Rate: 2 mm/hr (ref 0–30)

## 2020-06-23 LAB — VITAMIN B12: Vitamin B-12: 293 pg/mL (ref 232–1245)

## 2020-06-23 LAB — COMPREHENSIVE METABOLIC PANEL
ALT: 34 IU/L (ref 0–44)
AST: 26 IU/L (ref 0–40)
Albumin/Globulin Ratio: 1.6 (ref 1.2–2.2)
Albumin: 4.2 g/dL (ref 3.8–4.9)
Alkaline Phosphatase: 59 IU/L (ref 44–121)
BUN/Creatinine Ratio: 9 (ref 9–20)
BUN: 10 mg/dL (ref 6–24)
Bilirubin Total: 0.3 mg/dL (ref 0.0–1.2)
CO2: 21 mmol/L (ref 20–29)
Calcium: 9.3 mg/dL (ref 8.7–10.2)
Chloride: 101 mmol/L (ref 96–106)
Creatinine, Ser: 1.16 mg/dL (ref 0.76–1.27)
Globulin, Total: 2.6 g/dL (ref 1.5–4.5)
Glucose: 99 mg/dL (ref 65–99)
Potassium: 4.1 mmol/L (ref 3.5–5.2)
Sodium: 137 mmol/L (ref 134–144)
Total Protein: 6.8 g/dL (ref 6.0–8.5)
eGFR: 75 mL/min/{1.73_m2} (ref >59)

## 2020-06-23 LAB — C-REACTIVE PROTEIN: CRP: 3 mg/L (ref 0–10)

## 2020-06-23 LAB — VITAMIN D 25 HYDROXY (VIT D DEFICIENCY, FRACTURES): Vit D, 25-Hydroxy: 19.2 ng/mL — ABNORMAL LOW (ref 30.0–100.0)

## 2020-06-23 LAB — TSH: TSH: 2.24 u[IU]/mL (ref 0.450–4.500)

## 2020-06-23 LAB — HEMOGLOBIN A1C
Est. average glucose Bld gHb Est-mCnc: 114 mg/dL
Hgb A1c MFr Bld: 5.6 % (ref 4.8–5.6)

## 2020-09-22 ENCOUNTER — Other Ambulatory Visit: Payer: Self-pay

## 2020-09-22 ENCOUNTER — Encounter: Payer: Self-pay | Admitting: Family Medicine

## 2020-09-22 ENCOUNTER — Ambulatory Visit (INDEPENDENT_AMBULATORY_CARE_PROVIDER_SITE_OTHER): Payer: Managed Care, Other (non HMO) | Admitting: Family Medicine

## 2020-09-22 VITALS — BP 118/80 | HR 100 | Temp 98.2°F | Resp 16 | Ht 74.0 in | Wt 205.2 lb

## 2020-09-22 DIAGNOSIS — R61 Generalized hyperhidrosis: Secondary | ICD-10-CM

## 2020-09-22 DIAGNOSIS — E559 Vitamin D deficiency, unspecified: Secondary | ICD-10-CM

## 2020-09-22 DIAGNOSIS — E785 Hyperlipidemia, unspecified: Secondary | ICD-10-CM

## 2020-09-22 DIAGNOSIS — R7303 Prediabetes: Secondary | ICD-10-CM | POA: Diagnosis not present

## 2020-09-22 DIAGNOSIS — K219 Gastro-esophageal reflux disease without esophagitis: Secondary | ICD-10-CM | POA: Diagnosis not present

## 2020-09-22 DIAGNOSIS — R5383 Other fatigue: Secondary | ICD-10-CM

## 2020-09-22 DIAGNOSIS — E538 Deficiency of other specified B group vitamins: Secondary | ICD-10-CM

## 2020-09-22 NOTE — Progress Notes (Signed)
Name: GERAN HAITHCOCK   MRN: [id number]    DOB: 1965/04/09   Date:09/22/2020       Progress Note  Chief Complaint  Patient presents with   Hyperlipidemia     Subjective:   Willie Hess is a 55 y.o. male, presents to clinic for routine f/up  Eating healthier  Weight down a little bit from last OV, - 3 lbs  Wt Readings from Last 5 Encounters:  09/22/20 205 lb 3.2 oz (93.1 kg)  06/22/20 208 lb (94.3 kg)  04/09/20 209 lb 4.8 oz (94.9 kg)  10/31/19 202 lb (91.6 kg)  10/11/19 201 lb (91.2 kg)   BMI Readings from Last 5 Encounters:  09/22/20 26.35 kg/m  06/22/20 26.71 kg/m  04/09/20 26.87 kg/m  10/31/19 25.94 kg/m  10/11/19 25.81 kg/m   Hyperlipidemia: Redid screening with work this am he wants to wait and see the results, not on meds He will send lab results  Last Lipids: Lab Results  Component Value Date   CHOL 248 (H) 04/09/2020   HDL 58 04/09/2020   LDLCALC 153 (H) 04/09/2020   TRIG 207 (H) 04/09/2020   CHOLHDL 4.3 04/09/2020    Sweats - more rare now Feels some of it was contributed to what he eats, sometime seems related to work stress, job change stress is much less Sleeping about 7 hours Sometimes poor sleep, early falling asleep, some snoring  Family history - father with heart disease, first trouble with it in 66's Olderst sister with heart valve issues, no known young MI or stroke in immediate family members    Current Outpatient Medications:    blood glucose meter kit and supplies, Dispense based on patient and insurance preference. Use up to four times daily as directed. (FOR ICD-10 E10.9, E11.9)., Disp: 1 each, Rfl: 0   Omeprazole-Sodium Bicarbonate (ZEGERID) 20-1100 MG CAPS capsule, Take 2 capsules by mouth daily. , Disp: , Rfl:    Blood Glucose Monitoring Suppl (ACCU-CHEK GUIDE ME) w/Device KIT, 1 Device by Does not apply route once for 1 dose., Disp: 1 kit, Rfl: 0  Patient Active Problem List   Diagnosis Date Noted   Hyperlipidemia  04/28/2020   Prediabetes 09/25/2017   Hx of smoking 07/27/2017   Benign neoplasm of cecum    Benign neoplasm of ascending colon    Benign neoplasm of transverse colon    Benign neoplasm of sigmoid colon    GERD without esophagitis 12/11/2014    Past Surgical History:  Procedure Laterality Date   COLONOSCOPY WITH PROPOFOL N/A 11/12/2015   Procedure: COLONOSCOPY WITH PROPOFOL;  Surgeon: Lucilla Lame, MD;  Location: [place] [facility];  Service: Endoscopy;  Laterality: N/A;   ESOPHAGOGASTRODUODENOSCOPY     INSERTION OF MESH Left 08/24/2017   Procedure: INSERTION OF MESH;  Surgeon: Jules Husbands, MD;  Location: [facility];  Service: General;  Laterality: Left;   POLYPECTOMY  11/12/2015   Procedure: POLYPECTOMY;  Surgeon: Lucilla Lame, MD;  Location: [place];  Service: Endoscopy;;   ROBOT ASSISTED INGUINAL HERNIA REPAIR Left 08/24/2017   Procedure: ROBOT ASSISTED INGUINAL HERNIA REPAIR;  Surgeon: Jules Husbands, MD;  Location: [facility];  Service: General;  Laterality: Left;   URETEROSCOPY WITH HOLMIUM LASER LITHOTRIPSY Left 09/23/2014   Procedure: URETEROSCOPY, CYSTOSCOPY WITH STENT PLACEMENT, HOLMIUM STAND BY ;  Surgeon: Royston Cowper, MD;  Location: [facility];  Service: Urology;  Laterality: Left;   VASECTOMY     WISDOM TOOTH EXTRACTION  Family History  Problem Relation Age of Onset   Thyroid disease Mother    Heart disease Father    Heart disease Sister    Other Brother        Lyme Disease   Heart disease Paternal Grandmother    Emphysema Paternal Grandfather     Social History   Tobacco Use   Smoking status: Former    Packs/day: 1.00    Years: 51.00    Pack years: 51.00    Types: Cigarettes    Quit date: 2020    Years since quitting: 2.6   Smokeless tobacco: Never  Vaping Use   Vaping Use: Never used  Substance Use Topics   Alcohol use: No   Drug use: No     Allergies  Allergen Reactions   Bee Venom Swelling    Health Maintenance  Topic Date Due    COVID-19 Vaccine (1) 10/08/2020 (Originally 09/13/1970)   INFLUENZA VACCINE  11/08/2020 (Originally 09/14/2020)   Zoster Vaccines- Shingrix (1 of 2) 12/23/2020 (Originally 09/13/2015)   COLONOSCOPY (Pts 45-81yrs Insurance coverage will need to be confirmed)  11/11/2020   TETANUS/TDAP  10/12/2026   Hepatitis C Screening  Completed   HIV Screening  Completed   Pneumococcal Vaccine 64-42 Years old  Aged Out   HPV VACCINES  Aged Out    Chart Review Today: I personally reviewed active problem list, medication list, allergies, family history, social history, health maintenance, notes from last encounter, lab results, imaging with the patient/caregiver today.   Review of Systems  Constitutional: Negative.   HENT: Negative.    Eyes: Negative.   Respiratory: Negative.    Cardiovascular: Negative.   Gastrointestinal: Negative.   Endocrine: Negative.   Genitourinary: Negative.   Musculoskeletal: Negative.   Skin: Negative.   Allergic/Immunologic: Negative.   Neurological: Negative.   Hematological: Negative.   Psychiatric/Behavioral: Negative.    All other systems reviewed and are negative.   Objective:   Vitals:   09/22/20 0824  BP: 118/80  Pulse: 100  Resp: 16  Temp: 98.2 F (36.8 C)  SpO2: 99%  Weight: 205 lb 3.2 oz (93.1 kg)  Height: $Remove'6\' 2"'hZLXezO$  (1.88 m)    Body mass index is 26.35 kg/m.  Physical Exam Vitals and nursing note reviewed.  Constitutional:      General: He is not in acute distress.    Appearance: Normal appearance. He is well-developed, well-groomed and overweight. He is not ill-appearing, toxic-appearing or diaphoretic.     Interventions: Face mask in place.  HENT:     Head: Normocephalic and atraumatic.     Jaw: No trismus.     Right Ear: External ear normal.     Left Ear: External ear normal.  Eyes:     General: Lids are normal. No scleral icterus.       Right eye: No discharge.        Left eye: No discharge.     Conjunctiva/sclera: Conjunctivae  normal.  Neck:     Trachea: Trachea and phonation normal. No tracheal deviation.  Cardiovascular:     Rate and Rhythm: Normal rate and regular rhythm.     Pulses: Normal pulses.          Radial pulses are 2+ on the right side and 2+ on the left side.       Posterior tibial pulses are 2+ on the right side and 2+ on the left side.     Heart sounds: Normal heart sounds. No murmur heard.  No friction rub. No gallop.  Pulmonary:     Effort: Pulmonary effort is normal. No respiratory distress.     Breath sounds: Normal breath sounds. No stridor. No wheezing, rhonchi or rales.  Abdominal:     General: Bowel sounds are normal. There is no distension.     Palpations: Abdomen is soft.  Musculoskeletal:     Right lower leg: No edema.     Left lower leg: No edema.  Skin:    General: Skin is warm and dry.     Coloration: Skin is not jaundiced.     Findings: No rash.     Nails: There is no clubbing.  Neurological:     Mental Status: He is alert. Mental status is at baseline.     Cranial Nerves: No dysarthria or facial asymmetry.     Motor: No tremor or abnormal muscle tone.     Gait: Gait normal.  Psychiatric:        Mood and Affect: Mood normal.        Speech: Speech normal.        Behavior: Behavior normal. Behavior is cooperative.        Assessment & Plan:     ICD-10-CM   1. Dyslipidemia  E78.5    he had labs this am with work/[facility], working on diet and lost some weight, not on meds    2. Prediabetes  R73.03    last A1C 5.6, done with work labs    3. Hyperlipidemia, unspecified hyperlipidemia type  E78.5    same as above    4. GERD without esophagitis  K21.9    improved with diet changes    5. Night sweats  R61    here for recheck of sx from last OV, sweats, fatigue, improved with diet and life changes, particularly with job change and reduced stress    6. Other fatigue  R53.83    most sx improved with diet changes, energy a little better, less stressed with job  change    7. Vitamin B 12 deficiency  E53.8    in 200's last OV, reviewed supplements, may help with fatigue sx, recheck as f/up ov    8. Vitamin D deficiency  E55.9    19.2 3 months ago, encouraged supplement to goal >20, OTC vit D 3 1000 to 2000 IU daily or multivitamin with needed supplement doses       Return in about 6 months (around 03/25/2021) for Annual Physical.   Delsa Grana, PA-C 09/22/20 8:51 AM

## 2020-09-22 NOTE — Patient Instructions (Signed)
Lab Results  Component Value Date   CHOL 248 (H) 04/09/2020   HDL 58 04/09/2020   LDLCALC 153 (H) 04/09/2020   TRIG 207 (H) 04/09/2020   CHOLHDL 4.3 04/09/2020   The 10-year ASCVD risk score Mikey Bussing DC Jr., et al., 2013) is: 5.5%   Values used to calculate the score:     Age: 55 years     Sex: Male     Is Non-Hispanic African American: No     Diabetic: No     Tobacco smoker: No     Systolic Blood Pressure: 123456 mmHg     Is BP treated: No     HDL Cholesterol: 58 mg/dL     Total Cholesterol: 248 mg/dL  Recommendations on cholesterol and starting statins.    There is a benefit from LDL-C (bad cholesterol) lowering with statin therapy at virtually all levels of cardiovascular risk.  If statin therapy had no side effects and caused no financial burden, it might be reasonable to recommend it to virtually all at-risk individuals, similar to a healthy diet and exercise  It is this good of a medication!!  It reduces risk in almost everyone.   There are possible side effects with ALL medications and with statins there is a small subset of the population who may not metabolize it well, which causing muscle aches as a side effect (~5%).  We monitor for this, can test for this, and usually are careful to work with you to get a medication that gives you the benefits with minimal side effects.  Some people are sensitive to medications in general and we try to use the highest dose tolerated to give the most benefit.   [facility] cholesterol and statin guidelines are as follows: In adults 84 to 55 years of age without diabetes mellitus and with LDL-C levels ?74, at a 10-year atherosclerotic cardiovascular disease risk of ?7.5 percent, start a moderate-intensity statin if a discussion of treatment options favors statin therapy  If LDL is >160, statins are indicated.  Patients with other significant risk factors would also benefit from statin.  Some of these  factors include a family history of premature cardiovascular disease, chronic kidney disease, or chronic inflammatory disorder (such as chronic human immunodeficiency viral infection).   Can get more information at the following website:  [employer].co.za

## 2020-10-08 ENCOUNTER — Ambulatory Visit: Payer: Managed Care, Other (non HMO) | Admitting: Family Medicine

## 2021-03-19 ENCOUNTER — Ambulatory Visit (INDEPENDENT_AMBULATORY_CARE_PROVIDER_SITE_OTHER): Payer: Managed Care, Other (non HMO) | Admitting: Nurse Practitioner

## 2021-03-19 ENCOUNTER — Encounter: Payer: Self-pay | Admitting: Nurse Practitioner

## 2021-03-19 ENCOUNTER — Other Ambulatory Visit: Payer: Self-pay

## 2021-03-19 VITALS — BP 124/76 | HR 107 | Temp 98.1°F | Resp 16 | Ht 74.0 in | Wt 214.1 lb

## 2021-03-19 DIAGNOSIS — R0683 Snoring: Secondary | ICD-10-CM

## 2021-03-19 DIAGNOSIS — Z0000 Encounter for general adult medical examination without abnormal findings: Secondary | ICD-10-CM | POA: Diagnosis not present

## 2021-03-19 DIAGNOSIS — R600 Localized edema: Secondary | ICD-10-CM | POA: Diagnosis not present

## 2021-03-19 DIAGNOSIS — E785 Hyperlipidemia, unspecified: Secondary | ICD-10-CM

## 2021-03-19 DIAGNOSIS — R000 Tachycardia, unspecified: Secondary | ICD-10-CM | POA: Diagnosis not present

## 2021-03-19 DIAGNOSIS — R7303 Prediabetes: Secondary | ICD-10-CM

## 2021-03-19 DIAGNOSIS — Z131 Encounter for screening for diabetes mellitus: Secondary | ICD-10-CM

## 2021-03-19 DIAGNOSIS — R9431 Abnormal electrocardiogram [ECG] [EKG]: Secondary | ICD-10-CM

## 2021-03-19 DIAGNOSIS — Z1211 Encounter for screening for malignant neoplasm of colon: Secondary | ICD-10-CM | POA: Diagnosis not present

## 2021-03-19 DIAGNOSIS — K219 Gastro-esophageal reflux disease without esophagitis: Secondary | ICD-10-CM

## 2021-03-19 DIAGNOSIS — Z86010 Personal history of colonic polyps: Secondary | ICD-10-CM

## 2021-03-19 DIAGNOSIS — E559 Vitamin D deficiency, unspecified: Secondary | ICD-10-CM | POA: Diagnosis not present

## 2021-03-19 DIAGNOSIS — Z130 Encounter for screening for diseases of the blood and blood-forming organs and certain disorders involving the immune mechanism: Secondary | ICD-10-CM

## 2021-03-19 DIAGNOSIS — H9312 Tinnitus, left ear: Secondary | ICD-10-CM

## 2021-03-19 DIAGNOSIS — Z122 Encounter for screening for malignant neoplasm of respiratory organs: Secondary | ICD-10-CM

## 2021-03-19 DIAGNOSIS — E663 Overweight: Secondary | ICD-10-CM

## 2021-03-19 MED ORDER — NA SULFATE-K SULFATE-MG SULF 17.5-3.13-1.6 GM/177ML PO SOLN: 1.0000 | Freq: Once | ORAL | 0 refills | Status: AC

## 2021-03-19 NOTE — Progress Notes (Signed)
Gastroenterology Pre-Procedure Review  Request Date: 04/20/2021 Requesting Physician: Dr. Allen Norris  PATIENT REVIEW QUESTIONS: The patient responded to the following health history questions as indicated:    1. Are you having any GI issues? no 2. Do you have a personal history of Polyps? yes (LAST COLONOSCOPY) 3. Do you have a family history of Colon Cancer or Polyps? no 4. Diabetes Mellitus? no 5. Joint replacements in the past 12 months?no 6. Major health problems in the past 3 months?no 7. Any artificial heart valves, MVP, or defibrillator?no    MEDICATIONS & ALLERGIES:    Patient reports the following regarding taking any anticoagulation/antiplatelet therapy:   Plavix, Coumadin, Eliquis, Xarelto, Lovenox, Pradaxa, Brilinta, or Effient? no Aspirin? no  Patient confirms/reports the following medications:  Current Outpatient Medications  Medication Sig Dispense Refill   blood glucose meter kit and supplies Dispense based on patient and insurance preference. Use up to four times daily as directed. (FOR ICD-10 E10.9, E11.9). 1 each 0   Blood Glucose Monitoring Suppl (ACCU-CHEK GUIDE ME) w/Device KIT 1 Device by Does not apply route once for 1 dose. 1 kit 0   Omeprazole-Sodium Bicarbonate (ZEGERID) 20-1100 MG CAPS capsule Take 2 capsules by mouth daily.      No current facility-administered medications for this visit.    Patient confirms/reports the following allergies:  Allergies  Allergen Reactions   Bee Venom Swelling    No orders of the defined types were placed in this encounter.   AUTHORIZATION INFORMATION Primary Insurance: 1D#: Group #:  Secondary Insurance: 1D#: Group #:  SCHEDULE INFORMATION: Date:04/20/2021  Time: Location:[facility]

## 2021-03-19 NOTE — Progress Notes (Signed)
Name: Willie Hess   MRN: [id number]    DOB: 07-17-1965   Date:03/19/2021       Progress Note  Subjective  Chief Complaint  Chief Complaint  Patient presents with   Annual Exam    HPI  Patient presents for annual CPE and acute problems, here alone Discussed that there may be an additional charge to visit for discussing issues other than physical, patient verbalized understanding.   IPSS Questionnaire (AUA-7): Over the past month   1)  How often have you had a sensation of not emptying your bladder completely after you finish urinating?  0 - Not at all  2)  How often have you had to urinate again less than two hours after you finished urinating? 0 - Not at all  3)  How often have you found you stopped and started again several times when you urinated?  0 - Not at all  4) How difficult have you found it to postpone urination?  1 - Less than 1 time in 5  5) How often have you had a weak urinary stream?  0 - Not at all  6) How often have you had to push or strain to begin urination?  0 - Not at all  7) How many times did you most typically get up to urinate from the time you went to bed until the time you got up in the morning?  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  Recommendations discussed to get 150 min a week of physical activity Sleep: He says he has been having restless sleeping the past week, he sleeps better in his recliner.  He says he does snore.  His ESS: 11.  Will order sleep study.  Family history of cardiac issues, he is concerned about heart failure.  He denies any chest pain or shortness of breath.  He does say that his heart races some times.  He is tachycardic in the office today. Will get an ekg. He also reports that he does get swelling in his lower extremities.  He says he is worried  about the size of his abdomen and remembers that his dad had the same belly and had heart failure.  EKG showed  Sinus Rhythm LVH with secondary repolarization abnormality  Extensive ST-T changes are probably due to ventricular hypertrophy  Depression: phq 9 is negative I so not have a previous ekg to compare to, will refer to cardiology for abnormal ekg.   Tinnitus: He says he has tinnitus in his left ear.  He says he has seen an ENT previous for this. He is going to reach out to ENT for an appointment, will call back if he needs a new referral.   Depression screen [facility] 2/9 03/19/2021 09/22/2020 06/22/2020 04/09/2020 10/07/2019  Decreased Interest 1 0 0 0 0  Down, Depressed, Hopeless 0 0 0 0 0  PHQ - 2 Score 1 0 0 0 0  Altered sleeping 0 0 - 0 0  Tired, decreased energy 0 0 - 0 0  Change in appetite 0 0 - 0 0  Feeling bad or failure about yourself  0 0 - 0 0  Trouble concentrating 0 0 - 0 0  Moving slowly or fidgety/restless 0 0 - 0 0  Suicidal thoughts  0 0 - 0 0  PHQ-9 Score 1 0 - 0 0  Difficult doing work/chores Not difficult at all Not difficult at all - Not difficult at all Not difficult at all    Hypertension:  BP Readings from Last 3 Encounters:  03/19/21 124/76  09/22/20 118/80  06/22/20 138/84    Obesity: Wt Readings from Last 3 Encounters:  03/19/21 214 lb 1.6 oz (97.1 kg)  09/22/20 205 lb 3.2 oz (93.1 kg)  06/22/20 208 lb (94.3 kg)   BMI Readings from Last 3 Encounters:  03/19/21 27.49 kg/m  09/22/20 26.35 kg/m  06/22/20 26.71 kg/m     Lipids:  Lab Results  Component Value Date   CHOL 248 (H) 04/09/2020   CHOL 232 (H) 10/03/2018   CHOL 186 09/20/2017   Lab Results  Component Value Date   HDL 58 04/09/2020   HDL 43 10/03/2018   HDL 46 09/20/2017   Lab Results  Component Value Date   LDLCALC 153 (H) 04/09/2020   LDLCALC 156 (H) 10/03/2018   LDLCALC 125 (H) 09/20/2017   Lab Results  Component Value Date   TRIG 207 (H) 04/09/2020   TRIG 166 (H)  10/03/2018   TRIG 76 09/20/2017   Lab Results  Component Value Date   CHOLHDL 4.3 04/09/2020   CHOLHDL 5.4 (H) 10/03/2018   CHOLHDL 4.0 09/20/2017   No results found for: LDLDIRECT Glucose:  Glucose  Date Value Ref Range Status  06/22/2020 99 65 - 99 mg/dL Final  04/09/2020 83 65 - 99 mg/dL Final  10/03/2018 97 65 - 99 mg/dL Final   Glucose, Bld  Date Value Ref Range Status  09/16/2014 132 (H) 65 - 99 mg/dL Final  09/14/2014 146 (H) 65 - 99 mg/dL Final    [facility] Visit [facility]/09/[facility]  AUDIT-C Score 0       Married STD testing and prevention (HIV/chl/gon/syphilis): 10/03/2018 Hep C: 10/03/2018  Skin cancer: Discussed monitoring for atypical lesions Colorectal cancer: 11/12/2015, recommended to have every 5 years, order placed Prostate cancer: no concerns No results found for: PSA   Lung cancer:   Low Dose CT Chest recommended if Age 54-80 years, 30 pack-year currently smoking OR have quit w/in 15years. Patient does qualify.   AAA:  The USPSTF recommends one-time screening with ultrasonography in men ages 108 to 53 years who have ever smoked ECG:  none  Vaccines:  HPV: up to at age 53 , ask insurance if age between 40-45  Shingrix: 42-64 yo and ask insurance if covered when patient above 28 yo Pneumonia:  educated and discussed with patient. Flu:  educated and discussed with patient.  Advanced Care Planning: A voluntary discussion about advance care planning including the explanation and discussion of advance directives.  Discussed health care proxy and Living will, and the patient was able to identify a health care proxy as Willie Hess.  Patient does not have a living will at present time. If patient does have living will, I have requested they bring this to the clinic to be scanned in to their chart.  Patient Active Problem List   Diagnosis Date Noted   Hyperlipidemia 04/28/2020   Prediabetes 09/25/2017   Hx of  smoking 07/27/2017   Benign neoplasm of cecum    Benign neoplasm of ascending colon    Benign neoplasm of transverse colon    Benign neoplasm of sigmoid colon    GERD without esophagitis 12/11/2014    Past  Surgical History:  Procedure Laterality Date   COLONOSCOPY WITH PROPOFOL N/A 11/12/2015   Procedure: COLONOSCOPY WITH PROPOFOL;  Surgeon: Lucilla Lame, MD;  Location: [facility];  Service: Endoscopy;  Laterality: N/A;   ESOPHAGOGASTRODUODENOSCOPY     INSERTION OF MESH Left 08/24/2017   Procedure: INSERTION OF MESH;  Surgeon: Jules Husbands, MD;  Location: [facility];  Service: General;  Laterality: Left;   POLYPECTOMY  11/12/2015   Procedure: POLYPECTOMY;  Surgeon: Lucilla Lame, MD;  Location: [facility];  Service: Endoscopy;;   ROBOT ASSISTED INGUINAL HERNIA REPAIR Left 08/24/2017   Procedure: ROBOT ASSISTED INGUINAL HERNIA REPAIR;  Surgeon: Jules Husbands, MD;  Location: [facility];  Service: General;  Laterality: Left;   URETEROSCOPY WITH HOLMIUM LASER LITHOTRIPSY Left 09/23/2014   Procedure: URETEROSCOPY, CYSTOSCOPY WITH STENT PLACEMENT, HOLMIUM STAND BY ;  Surgeon: Royston Cowper, MD;  Location: [facility];  Service: Urology;  Laterality: Left;   VASECTOMY     WISDOM TOOTH EXTRACTION      Family History  Problem Relation Age of Onset   Thyroid disease Mother    Heart disease Father    Heart disease Sister    Other Brother        Lyme Disease   Heart disease Paternal Grandmother    Emphysema Paternal Grandfather     Social History   Socioeconomic History   Marital status: Married    Spouse name: Lauren   Number of children: 2   Years of education: Not on file   Highest education level: Not on file  Occupational History   Not on file  Tobacco Use   Smoking status: Former    Packs/day: 1.00    Years: 51.00    Pack years: 51.00    Types: Cigarettes    Quit date: 2020    Years since quitting: 3.0   Smokeless tobacco: Never  Vaping Use   Vaping Use: Never  used  Substance and Sexual Activity   Alcohol use: No   Drug use: No   Sexual activity: Yes    Partners: Female  Other Topics Concern   Not on file  Social History Narrative   Not on file   Social Determinants of Health   Financial Resource Strain: Medium Risk   Difficulty of Paying Living Expenses: Somewhat hard  Food Insecurity: Food Insecurity Present   Worried About Charity fundraiser in the Last Year: Often true   Arboriculturist in the Last Year: Often true  Transportation Needs: No Transportation Needs   Lack of Transportation (Medical): No   Lack of Transportation (Non-Medical): No  Physical Activity: Insufficiently Active   Days of Exercise per Week: 1 day   Minutes of Exercise per Session: 10 min  Stress: Stress Concern Present   Feeling of Stress : To some extent  Social Connections: Socially Integrated   Frequency of Communication with Friends and Family: Three times a week   Frequency of Social Gatherings with Friends and Family: Twice a week   Attends Religious Services: 1 to 4 times per year   Active Member of [employer] or Organizations: Yes   Attends Music therapist: More than 4 times per year   Marital Status: Married  Human resources officer Violence: Not At Risk   Fear of Current or Ex-Partner: No   Emotionally Abused: No   Physically Abused: No   Sexually Abused: No   Increased stress and financial stain due to recently changing jobs  and having a pay cut. Offered social work consult, declined at this time.  Will let us know if needs further assistance.   Current Outpatient Medications:    blood glucose meter kit and supplies, Dispense based on patient and insurance preference. Use up to four times daily as directed. (FOR ICD-10 E10.9, E11.9)., Disp: 1 each, Rfl: 0   Omeprazole-Sodium Bicarbonate (ZEGERID) 20-1100 MG CAPS capsule, Take 2 capsules by mouth daily. , Disp: , Rfl:    Blood Glucose Monitoring Suppl (ACCU-CHEK GUIDE ME) w/Device KIT, 1  Device by Does not apply route once for 1 dose., Disp: 1 kit, Rfl: 0  Allergies  Allergen Reactions   Bee Venom Swelling     ROS  Constitutional: Negative for fever or weight change.  HEENT: positive for tinnitus left ear Respiratory: Negative for cough and shortness of breath.   Cardiovascular: Negative for chest pain or palpitations.  Gastrointestinal: Negative for abdominal pain, no bowel changes.  Musculoskeletal: Negative for gait problem or joint swelling.  Skin: Negative for rash.  Neurological: Negative for dizziness or headache.  No other specific complaints in a complete review of systems (except as listed in HPI above).    Objective  Vitals:   03/19/21 0841  BP: 124/76  Pulse: (!) 107  Resp: 16  Temp: 98.1 F (36.7 C)  TempSrc: Oral  SpO2: 96%  Weight: 214 lb 1.6 oz (97.1 kg)  Height: $Remove'6\' 2"'bsjzakg$  (1.88 m)    Body mass index is 27.49 kg/m.  Physical Exam Constitutional: Patient appears well-developed and well-nourished. No distress.  HENT: Head: Normocephalic and atraumatic. Ears: B TMs ok, no erythema or effusion; Nose: Nose normal. Mouth/Throat: not done Eyes: Conjunctivae and EOM are normal. Pupils are equal, round, and reactive to light. No scleral icterus.  Neck: Normal range of motion. Neck supple. No JVD present. No thyromegaly present.  Cardiovascular: Normal rate, regular rhythm and normal heart sounds.  No murmur heard. No BLE edema. Pulmonary/Chest: Effort normal and breath sounds normal. No respiratory distress. Abdominal: Soft. Bowel sounds are normal, no distension. There is no tenderness. no masses MALE GENITALIA: Normal descended testes bilaterally, no masses palpated, no hernias, no lesions, no discharge RECTAL: not done Musculoskeletal: Normal range of motion, no joint effusions. No gross deformities Neurological: he is alert and oriented to person, place, and time. No cranial nerve deficit. Coordination, balance, strength, speech and gait are  normal.  Skin: Skin is warm and dry. No rash noted. No erythema.  Psychiatric: Patient has a normal mood and affect. behavior is normal. Judgment and thought content normal.   No results found for this or any previous visit (from the past 2160 hour(s)).   Fall Risk: Fall Risk  03/19/2021 09/22/2020 06/22/2020 04/09/2020 10/07/2019  Falls in the past year? 0 0 0 0 0  Number falls in past yr: 0 0 0 0 0  Injury with Fall? 0 0 0 0 0  Risk for fall due to : No Fall Risks - - - -  Follow up Falls prevention discussed - - - Falls evaluation completed     Functional Status Survey: Is the patient deaf or have difficulty hearing?: Yes Does the patient have difficulty seeing, even when wearing glasses/contacts?: No Does the patient have difficulty concentrating, remembering, or making decisions?: Yes Does the patient have difficulty walking or climbing stairs?: No Does the patient have difficulty dressing or bathing?: No Does the patient have difficulty doing errands alone such as visiting a doctor's office or shopping?:  No   Assessment & Plan  1. Adult general medical exam -work on making healthy food choices, increase physical activity -reach out if financial issues get worse or you change your mind about referral to social worker - Ambulatory referral to Gastroenterology - Lipid panel - CBC with Differential/Platelet - Comprehensive metabolic panel  2. Screening for colon cancer  - Ambulatory referral to Gastroenterology  3. Hyperlipidemia, unspecified hyperlipidemia type -continue to work on diet - Lipid panel - Comprehensive metabolic panel  4. Vitamin D deficiency -has not done any supplementation from last visit.  Discussed taking Vitamin D supplements 2000 units a day and will recheck level next visit  5. GERD without esophagitis -continue current treatment  6. Overweight (BMI 25.0-29.9) -continue working on diet and exercise - Lipid panel - CBC with  Differential/Platelet - Comprehensive metabolic panel  7. Screening for deficiency anemia  - CBC with Differential/Platelet  8. Prediabetes -continue working on healthy eating and exercise - Hemoglobin A1c  9. Screening for diabetes mellitus  - Hemoglobin A1c  10. Snoring -ESS score was 11, will place referral for sleep study - Ambulatory referral to Pulmonology  11. Tachycardia -he has concerns about family history of heart failure, he denies any chest pain but does report occasional lower extremity edema - EKG 12-Lead - Brain natriuretic peptide  12. Localized edema -he has concerns about family history of heart failure, he denies any chest pain but does report occasional lower extremity edema - EKG 12-Lead - Brain natriuretic peptide  13. Abnormal EKG -Abnormal EKG- Sinus Rhythm LVH with secondary repolarization abnormality  Extensive ST-T changes are probably due to ventricular hypertrophy I do not have a previous ekg to compare - Ambulatory referral to Cardiology  14. Encounter for screening for lung cancer -quit smoking in 2020 - Ambulatory Referral [facility]  15. Tinnitus, left ear  -saw ENT previously for this complaint, will reach out to ENT for appointment -patient will let us know if he needs a new referral.  -Prostate cancer screening and PSA options (with potential risks and benefits of testing vs not testing) were discussed along with recent recs/guidelines. -USPSTF grade A and B recommendations reviewed with patient; age-appropriate recommendations, preventive care, screening tests, etc discussed and encouraged; healthy living encouraged; see AVS for patient education given to patient -Discussed importance of 150 minutes of physical activity weekly, eat two servings of fish weekly, eat one serving of tree nuts ( cashews, pistachios, pecans, almonds.Marland Kitchen) every other day, eat 6 servings of fruit/vegetables daily and drink plenty  of water and avoid sweet beverages.

## 2021-03-20 LAB — CBC WITH DIFFERENTIAL/PLATELET
Basophils Absolute: 0.1 10*3/uL (ref 0.0–0.2)
Basos: 1 %
EOS (ABSOLUTE): 0.1 10*3/uL (ref 0.0–0.4)
Eos: 1 %
Hematocrit: 49.3 % (ref 37.5–51.0)
Hemoglobin: 17.2 g/dL (ref 13.0–17.7)
Immature Grans (Abs): 0 10*3/uL (ref 0.0–0.1)
Immature Granulocytes: 1 %
Lymphocytes Absolute: 1.8 10*3/uL (ref 0.7–3.1)
Lymphs: 24 %
MCH: 32.3 pg (ref 26.6–33.0)
MCHC: 34.9 g/dL (ref 31.5–35.7)
MCV: 93 fL (ref 79–97)
Monocytes Absolute: 0.7 10*3/uL (ref 0.1–0.9)
Monocytes: 9 %
Neutrophils Absolute: 4.9 10*3/uL (ref 1.4–7.0)
Neutrophils: 64 %
Platelets: 243 10*3/uL (ref 150–450)
RBC: 5.32 x10E6/uL (ref 4.14–5.80)
RDW: 12.8 % (ref 11.6–15.4)
WBC: 7.7 10*3/uL (ref 3.4–10.8)

## 2021-03-20 LAB — COMPREHENSIVE METABOLIC PANEL
ALT: 19 IU/L (ref 0–44)
AST: 16 IU/L (ref 0–40)
Albumin/Globulin Ratio: 1.5 (ref 1.2–2.2)
Albumin: 4.1 g/dL (ref 3.8–4.9)
Alkaline Phosphatase: 66 IU/L (ref 44–121)
BUN/Creatinine Ratio: 10 (ref 9–20)
BUN: 12 mg/dL (ref 6–24)
Bilirubin Total: 0.5 mg/dL (ref 0.0–1.2)
CO2: 24 mmol/L (ref 20–29)
Calcium: 9.6 mg/dL (ref 8.7–10.2)
Chloride: 102 mmol/L (ref 96–106)
Creatinine, Ser: 1.17 mg/dL (ref 0.76–1.27)
Globulin, Total: 2.8 g/dL (ref 1.5–4.5)
Glucose: 99 mg/dL (ref 70–99)
Potassium: 4.3 mmol/L (ref 3.5–5.2)
Sodium: 139 mmol/L (ref 134–144)
Total Protein: 6.9 g/dL (ref 6.0–8.5)
eGFR: 74 mL/min/{1.73_m2} (ref >59)

## 2021-03-20 LAB — BRAIN NATRIURETIC PEPTIDE: BNP: 312.5 pg/mL — ABNORMAL HIGH (ref 0.0–100.0)

## 2021-03-20 LAB — HEMOGLOBIN A1C
Est. average glucose Bld gHb Est-mCnc: 120 mg/dL
Hgb A1c MFr Bld: 5.8 % — ABNORMAL HIGH (ref 4.8–5.6)

## 2021-03-20 LAB — LIPID PANEL
Chol/HDL Ratio: 3.7 ratio (ref 0.0–5.0)
Cholesterol, Total: 225 mg/dL — ABNORMAL HIGH (ref 100–199)
HDL: 61 mg/dL (ref >39)
LDL Chol Calc (NIH): 136 mg/dL — ABNORMAL HIGH (ref 0–99)
Triglycerides: 160 mg/dL — ABNORMAL HIGH (ref 0–149)
VLDL Cholesterol Cal: 28 mg/dL (ref 5–40)

## 2021-03-22 ENCOUNTER — Telehealth: Payer: Self-pay

## 2021-03-22 NOTE — Telephone Encounter (Signed)
Copied from [place] [id number]. Topic: General - Inquiry >> Mar 22, 2021  3:40 PM McGill, Nelva Bush wrote: Reason for CRM: Pt requesting call back from [facility], pt stated he missed her call twice today.

## 2021-03-23 NOTE — Telephone Encounter (Signed)
Patient notified of labs.   

## 2021-04-05 ENCOUNTER — Other Ambulatory Visit: Payer: Self-pay

## 2021-04-05 DIAGNOSIS — Z87891 Personal history of nicotine dependence: Secondary | ICD-10-CM

## 2021-04-15 ENCOUNTER — Ambulatory Visit: Admission: RE | Admit: 2021-04-15 | Payer: Managed Care, Other (non HMO) | Source: Ambulatory Visit

## 2021-04-15 ENCOUNTER — Ambulatory Visit
Admission: RE | Admit: 2021-04-15 | Discharge: 2021-04-15 | Disposition: A | Discharge status: Home / Self Care | Payer: Managed Care, Other (non HMO) | Source: Ambulatory Visit | Attending: Acute Care | Admitting: Acute Care

## 2021-04-15 DIAGNOSIS — Z87891 Personal history of nicotine dependence: Secondary | ICD-10-CM

## 2021-04-19 ENCOUNTER — Other Ambulatory Visit: Payer: Self-pay

## 2021-04-19 DIAGNOSIS — Z87891 Personal history of nicotine dependence: Secondary | ICD-10-CM

## 2021-04-20 ENCOUNTER — Encounter
Admission: RE | Disposition: A | Discharge status: Home / Self Care | Payer: Self-pay | Source: Home / Self Care | Attending: Gastroenterology

## 2021-04-20 ENCOUNTER — Ambulatory Visit: Payer: Managed Care, Other (non HMO) | Admitting: Certified Registered"

## 2021-04-20 ENCOUNTER — Encounter: Payer: Self-pay | Admitting: Gastroenterology

## 2021-04-20 ENCOUNTER — Other Ambulatory Visit: Payer: Self-pay

## 2021-04-20 ENCOUNTER — Ambulatory Visit
Admission: RE | Admit: 2021-04-20 | Discharge: 2021-04-20 | Disposition: A | Discharge status: Home / Self Care | Payer: Managed Care, Other (non HMO) | Attending: Gastroenterology | Admitting: Gastroenterology

## 2021-04-20 DIAGNOSIS — Z87891 Personal history of nicotine dependence: Secondary | ICD-10-CM | POA: Insufficient documentation

## 2021-04-20 DIAGNOSIS — K641 Second degree hemorrhoids: Secondary | ICD-10-CM | POA: Insufficient documentation

## 2021-04-20 DIAGNOSIS — K5730 Diverticulosis of large intestine without perforation or abscess without bleeding: Secondary | ICD-10-CM | POA: Diagnosis not present

## 2021-04-20 DIAGNOSIS — D123 Benign neoplasm of transverse colon: Secondary | ICD-10-CM | POA: Insufficient documentation

## 2021-04-20 DIAGNOSIS — D120 Benign neoplasm of cecum: Secondary | ICD-10-CM | POA: Insufficient documentation

## 2021-04-20 DIAGNOSIS — D122 Benign neoplasm of ascending colon: Secondary | ICD-10-CM | POA: Diagnosis not present

## 2021-04-20 DIAGNOSIS — Z1211 Encounter for screening for malignant neoplasm of colon: Secondary | ICD-10-CM | POA: Diagnosis not present

## 2021-04-20 DIAGNOSIS — K635 Polyp of colon: Secondary | ICD-10-CM | POA: Diagnosis not present

## 2021-04-20 DIAGNOSIS — Z79899 Other long term (current) drug therapy: Secondary | ICD-10-CM | POA: Insufficient documentation

## 2021-04-20 DIAGNOSIS — K219 Gastro-esophageal reflux disease without esophagitis: Secondary | ICD-10-CM | POA: Insufficient documentation

## 2021-04-20 DIAGNOSIS — Z86010 Personal history of colonic polyps: Secondary | ICD-10-CM | POA: Diagnosis not present

## 2021-04-20 HISTORY — PX: COLONOSCOPY WITH PROPOFOL: SHX5780

## 2021-04-20 SURGERY — COLONOSCOPY WITH PROPOFOL
Anesthesia: General

## 2021-04-20 MED ORDER — SODIUM CHLORIDE 0.9 % IV SOLN: INTRAVENOUS | Status: DC

## 2021-04-20 MED ORDER — MIDAZOLAM HCL 2 MG/2ML IJ SOLN
INTRAMUSCULAR | Status: AC
  Filled 2021-04-20: qty 2

## 2021-04-20 MED ORDER — LIDOCAINE HCL (CARDIAC) PF 100 MG/5ML IV SOSY
PREFILLED_SYRINGE | INTRAVENOUS | Status: DC | PRN
  Administered 2021-04-20: 100 mg via INTRAVENOUS

## 2021-04-20 MED ORDER — PROPOFOL 10 MG/ML IV BOLUS
INTRAVENOUS | Status: DC | PRN
  Administered 2021-04-20: 60 mg via INTRAVENOUS
  Administered 2021-04-20: 20 mg via INTRAVENOUS
  Administered 2021-04-20: 40 mg via INTRAVENOUS

## 2021-04-20 MED ORDER — MIDAZOLAM HCL 2 MG/2ML IJ SOLN
INTRAMUSCULAR | Status: DC | PRN
  Administered 2021-04-20: 2 mg via INTRAVENOUS

## 2021-04-20 MED ORDER — SODIUM CHLORIDE 0.9 % IV SOLN: INTRAVENOUS | Status: DC | PRN

## 2021-04-20 MED ORDER — PROPOFOL 500 MG/50ML IV EMUL
INTRAVENOUS | Status: DC | PRN
  Administered 2021-04-20: 155 ug/kg/min via INTRAVENOUS

## 2021-04-20 NOTE — Anesthesia Postprocedure Evaluation (Signed)
Anesthesia Post Note ? ?Patient: Willie Hess ? ?Procedure(s) Performed: COLONOSCOPY WITH PROPOFOL ? ?Patient location during evaluation: Endoscopy ?Anesthesia Type: General ?Level of consciousness: awake and alert ?Pain management: pain level controlled ?Vital Signs Assessment: post-procedure vital signs reviewed and stable ?Respiratory status: spontaneous breathing, nonlabored ventilation, respiratory function stable and patient connected to nasal cannula oxygen ?Cardiovascular status: blood pressure returned to baseline and stable ?Postop Assessment: no apparent nausea or vomiting ?Anesthetic complications: no ? ? ?No notable events documented. ? ? ?Last Vitals:  ?Vitals:  ? 04/20/21 1138 04/20/21 1148  ?BP: 123/87 124/89  ?Pulse: 97 86  ?Resp: 17 20  ?Temp:    ?SpO2: 98% 98%  ?  ?Last Pain:  ?Vitals:  ? 04/20/21 1148  ?TempSrc:   ?PainSc: 0-No pain  ? ? ?  ?  ?  ?  ?  ?  ? ?Martha Clan ? ? ? ? ?

## 2021-04-20 NOTE — Transfer of Care (Signed)
Immediate Anesthesia Transfer of Care Note ? ?Patient: Willie Hess ? ?Procedure(s) Performed: COLONOSCOPY WITH PROPOFOL ? ?Patient Location: Endoscopy Unit ? ?Anesthesia Type:General ? ?Level of Consciousness: awake, drowsy and patient cooperative ? ?Airway & Oxygen Therapy: Patient Spontanous Breathing and Patient connected to face mask oxygen ? ?Post-op Assessment: Report given to RN and Post -op Vital signs reviewed and stable ? ?Post vital signs: Reviewed and stable ? ?Last Vitals:  ?Vitals Value Taken Time  ?BP 108/80 04/20/21 1119  ?Temp 36.4 ?C 04/20/21 1118  ?Pulse 101 04/20/21 1122  ?Resp 21 04/20/21 1122  ?SpO2 97 % 04/20/21 1122  ?Vitals shown include unvalidated device data. ? ?Last Pain:  ?Vitals:  ? 04/20/21 1118  ?TempSrc: Temporal  ?PainSc: Asleep  ?   ? ?  ? ?Complications: No notable events documented. ?

## 2021-04-20 NOTE — Anesthesia Preprocedure Evaluation (Signed)
Anesthesia Evaluation  ?Patient identified by MRN, date of birth, ID band ?Patient awake ? ? ? ?Reviewed: ?Allergy & Precautions, H&P , NPO status , Patient's Chart, lab work & pertinent test results, reviewed documented beta blocker date and time  ? ?History of Anesthesia Complications ?Negative for: history of anesthetic complications ? ?Airway ?Mallampati: II ? ?TM Distance: >3 FB ?Neck ROM: full ? ? ? Dental ? ?(+) Dental Advidsory Given ?  ?Pulmonary ?neg pulmonary ROS, neg shortness of breath, neg sleep apnea, neg COPD, neg recent URI, Not current smoker, former smoker,  ?  ?Pulmonary exam normal ?breath sounds clear to auscultation ? ? ? ? ? ? Cardiovascular ?Exercise Tolerance: Good ?(-) hypertension(-) angina(-) Past MI and (-) CHF negative cardio ROS ?Normal cardiovascular exam(-) dysrhythmias (-) Valvular Problems/Murmurs ?Rhythm:regular Rate:Normal ? ? ?  ?Neuro/Psych ?neg Seizures negative neurological ROS ? negative psych ROS  ? GI/Hepatic ?Neg liver ROS, GERD  Medicated and Controlled,  ?Endo/Other  ?negative endocrine ROSneg diabetes ? Renal/GU ?negative Renal ROS  ?negative genitourinary ?  ?Musculoskeletal ? ? Abdominal ?  ?Peds ? 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    Comment:  migraines 1x/6 mos (lesser HAs more often) ?No date: History of kidney stones ?    Comment:  h/o ?07/27/2017: Hx of smoking ?No date: Lyme disease ?09/25/2017: Prediabetes ?10/12/2015: Preventative health care ? ? Reproductive/Obstetrics ?negative OB ROS ? ?   ? ? ? ? ? ? ? ? ? ? ? ? ? ?  ?  ? ? ? ? ? ? ? ? ?Anesthesia Physical ? ?Anesthesia Plan ? ?ASA: 2 ? ?Anesthesia Plan: General  ? ?Post-op Pain Management:   ? ?Induction: Intravenous ? ?PONV Risk Score and Plan: 2 and Propofol infusion and TIVA ? ?Airway Management Planned: Natural Airway and Nasal Cannula ? ?Additional Equipment:  ? ?Intra-op Plan:  ? ?Post-operative Plan:  ? ?Informed Consent: I have reviewed the patients History and Physical, chart, labs and discussed the procedure including the risks, benefits and alternatives for the proposed anesthesia with the patient or authorized representative who has indicated his/her understanding and acceptance.  ? ? ? ?Dental Advisory Given ? ?Plan Discussed with: Anesthesiologist, CRNA and Surgeon ? ?Anesthesia Plan Comments:   ? ? ? ? ? ? ?Anesthesia Quick Evaluation ? ?

## 2021-04-20 NOTE — Anesthesia Procedure Notes (Signed)
Procedure Name: General with mask airway ?Date/Time: 04/20/2021 11:14 AM ?Performed by: Kelton Pillar, CRNA ?Pre-anesthesia Checklist: Patient identified, Emergency Drugs available, Suction available and Patient being monitored ?Patient Re-evaluated:Patient Re-evaluated prior to induction ?Oxygen Delivery Method: Simple face mask ?Induction Type: IV induction ?Placement Confirmation: positive ETCO2 and CO2 detector ?Dental Injury: Teeth and Oropharynx as per pre-operative assessment  ? ? ? ? ?

## 2021-04-20 NOTE — H&P (Signed)
? ?Lucilla Lame, MD [facility] ?[address] ?[place], [place] [address] ?Phone:[phone] ?Fax : [phone] ? ?Primary Care Physician:  Bo Merino, FNP ?Primary Gastroenterologist:  Dr. Allen Norris ? ?Pre-Procedure History & Physical: ?HPI:  Willie Hess is a 56 y.o. male is here for an colonoscopy. ?  ?Past Medical History:  ?Diagnosis Date  ? Arthritis   ? Benign neoplasm of ascending colon   ? Benign neoplasm of cecum   ? Benign neoplasm of sigmoid colon   ? Benign neoplasm of transverse colon   ? Decreased creatinine clearance 12/11/2014  ? 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 GERD (gastroesophageal reflux disease)   ? GERD without esophagitis 12/11/2014  ? Headache   ? migraines 1x/6 mos (lesser HAs more often)  ? History of kidney stones   ? h/o  ? Hx of smoking 07/27/2017  ? Lyme disease   ? Prediabetes 09/25/2017  ? Preventative health care 10/12/2015  ? ? ?Past Surgical History:  ?Procedure Laterality Date  ? COLONOSCOPY WITH PROPOFOL N/A 11/12/2015  ? Procedure: COLONOSCOPY WITH PROPOFOL;  Surgeon: Lucilla Lame, MD;  Location: [place];  Service: Endoscopy;  Laterality: N/A;  ? ESOPHAGOGASTRODUODENOSCOPY    ? INSERTION OF MESH Left 08/24/2017  ? Procedure: INSERTION OF MESH;  Surgeon: Jules Husbands, MD;  Location: [facility];  Service: General;  Laterality: Left;  ? POLYPECTOMY  11/12/2015  ? Procedure: POLYPECTOMY;  Surgeon: Lucilla Lame, MD;  Location: [place];  Service: Endoscopy;;  ? ROBOT ASSISTED INGUINAL HERNIA REPAIR Left 08/24/2017  ? Procedure: ROBOT ASSISTED INGUINAL HERNIA REPAIR;  Surgeon: Jules Husbands, MD;  Location: [facility];  Service: General;  Laterality: Left;  ? URETEROSCOPY WITH HOLMIUM LASER LITHOTRIPSY Left 09/23/2014  ? Procedure: URETEROSCOPY, CYSTOSCOPY WITH STENT PLACEMENT, HOLMIUM STAND BY ;  Surgeon: Royston Cowper, MD;  Location: [facility];  Service: Urology;  Laterality: Left;  ? VASECTOMY    ? WISDOM TOOTH EXTRACTION    ? ? ?Prior to Admission medications    ?Medication Sig Start Date End Date Taking? Authorizing Provider  ?Omeprazole-Sodium Bicarbonate (ZEGERID) 20-1100 MG CAPS capsule Take 2 capsules by mouth daily.    Yes [provider]  ?blood glucose meter kit and supplies Dispense based on patient and insurance preference. Use up to four times daily as directed. (FOR ICD-10 E10.9, E11.9). 06/22/20   Steele Sizer, MD  ?Blood Glucose Monitoring Suppl (ACCU-CHEK GUIDE ME) w/Device KIT 1 Device by Does not apply route once for 1 dose. 06/22/20 03/19/21  Steele Sizer, MD  ? ? ?Allergies as of 03/19/2021 - Review Complete 03/19/2021  ?Allergen Reaction Noted  ? Bee venom Swelling 09/15/2014  ? ? ?Family History  ?Problem Relation Age of Onset  ? Thyroid disease Mother   ? Heart disease Father   ? Heart disease Sister   ? Other Brother   ?     Lyme Disease  ? Heart disease Paternal Grandmother   ? Emphysema Paternal Grandfather   ? ? ?Social History  ? ?Socioeconomic History  ? Marital status: Married  ?  Spouse name: Ander Purpura  ? Number of children: 2  ? Years of education: Not on file  ? Highest education level: Not on file  ?Occupational History  ? Not on file  ?Tobacco Use  ? Smoking status: Former  ?  Packs/day: 1.00  ?  Years: 51.00  ?  Pack years: 51.00  ?  Types: Cigarettes  ?  Quit date:  2020  ?  Years since quitting: 3.1  ? Smokeless tobacco: Never  ?Vaping Use  ? Vaping Use: Never used  ?Substance and Sexual Activity  ? Alcohol use: No  ? Drug use: No  ? Sexual activity: Yes  ?  Partners: Female  ?Other Topics Concern  ? Not on file  ?Social History Narrative  ? Not on file  ? ?Social Determinants of Health  ? ?Financial Resource Strain: Medium Risk  ? Difficulty of Paying Living Expenses: Somewhat hard  ?Food Insecurity: Food Insecurity Present  ? Worried About Charity fundraiser in the Last Year: Often true  ? Ran Out of Food in the Last Year: Often true  ?Transportation Needs: No Transportation Needs  ? Lack of Transportation (Medical): No  ? Lack  of Transportation (Non-Medical): No  ?Physical Activity: Insufficiently Active  ? Days of Exercise per Week: 1 day  ? Minutes of Exercise per Session: 10 min  ?Stress: Stress Concern Present  ? Feeling of Stress : To some extent  ?Social Connections: Socially Integrated  ? Frequency of Communication with Friends and Family: Three times a week  ? Frequency of Social Gatherings with Friends and Family: Twice a week  ? Attends Religious Services: 1 to 4 times per year  ? Active Member of Clubs or Organizations: Yes  ? Attends Archivist Meetings: More than 4 times per year  ? Marital Status: Married  ?Intimate Partner Violence: Not At Risk  ? Fear of Current or Ex-Partner: No  ? Emotionally Abused: No  ? Physically Abused: No  ? Sexually Abused: No  ? ? ?Review of Systems: ?See HPI, otherwise negative ROS ? ?Physical Exam: ?BP (!) 133/95   Pulse 90   Temp (!) 97.2 ?F (36.2 ?C) (Temporal)   Resp 20   Ht _0  (1.88 m)   Wt 95.3 kg   SpO2 100%   BMI 26.96 kg/m?  ?General:   Alert,  pleasant and cooperative in NAD ?Head:  Normocephalic and atraumatic. ?Neck:  Supple; no masses or thyromegaly. ?Lungs:  Clear throughout to auscultation.    ?Heart:  Regular rate and rhythm. ?Abdomen:  Soft, nontender and nondistended. Normal bowel sounds, without guarding, and without rebound.   ?Neurologic:  Alert and  oriented x4;  grossly normal neurologically. ? ?Impression/Plan: ?Willie Hess is here for an colonoscopy to be performed for a history of adenomatous polyps on 2017 ? ? ?Risks, benefits, limitations, and alternatives regarding  colonoscopy have been reviewed with the patient.  Questions have been answered.  All parties agreeable. ? ? ?Lucilla Lame, MD  04/20/2021, 10:54 AM ?

## 2021-04-20 NOTE — Op Note (Signed)
[facility] ?Gastroenterology ?Patient Name: Willie Hess ?Procedure Date: 04/20/2021 10:55 AM ?MRN: [id number] ?Account #: [id number] ?Date of Birth: 18-Oct-1965 ?Admit Type: Outpatient ?Age: 56 ?Room: [facility] ENDO ROOM 4 ?Gender: Male ?Note Status: Finalized ?Instrument Name: Colonoscope [id number] ?Procedure:             Colonoscopy ?Indications:           High risk colon cancer surveillance: Personal history  ?                       of colonic polyps ?Providers:             Lucilla Lame MD, MD ?Referring MD:          Forest Gleason Md, MD (Referring MD) ?Medicines:             Propofol per Anesthesia ?Complications:         No immediate complications. ?Procedure:             Pre-Anesthesia Assessment: ?                       - Prior to the procedure, a History and Physical was  ?                       performed, and patient medications and allergies were  ?                       reviewed. The patient's tolerance of previous  ?                       anesthesia was also reviewed. The risks and benefits  ?                       of the procedure and the sedation options and risks  ?                       were discussed with the patient. All questions were  ?                       answered, and informed consent was obtained. Prior  ?                       Anticoagulants: The patient has taken no previous  ?                       anticoagulant or antiplatelet agents. ASA Grade  ?                       Assessment: II - A patient with mild systemic disease.  ?                       After reviewing the risks and benefits, the patient  ?                       was deemed in satisfactory condition to undergo the  ?                       procedure. ?  After obtaining informed consent, the colonoscope was  ?                       passed under direct vision. Throughout the procedure,  ?                       the patient's blood pressure, pulse, and oxygen  ?                       saturations were  monitored continuously. The  ?                       Colonoscope was introduced through the anus and  ?                       advanced to the the cecum, identified by appendiceal  ?                       orifice and ileocecal valve. The colonoscopy was  ?                       performed without difficulty. The patient tolerated  ?                       the procedure well. The quality of the bowel  ?                       preparation was excellent. ?Findings: ?     The perianal and digital rectal examinations were normal. ?     A 2 mm polyp was found in the cecum. The polyp was sessile. The polyp  ?     was removed with a cold snare. Resection and retrieval were complete. ?     A 6 mm polyp was found in the ascending colon. The polyp was sessile.  ?     The polyp was removed with a cold snare. Resection and retrieval were  ?     complete. ?     Two sessile polyps were found in the transverse colon. The polyps were 3  ?     to 4 mm in size. These polyps were removed with a cold snare. Resection  ?     and retrieval were complete. ?     Multiple small-mouthed diverticula were found in the entire colon. ?     Non-bleeding internal hemorrhoids were found during retroflexion. The  ?     hemorrhoids were Grade II (internal hemorrhoids that prolapse but reduce  ?     spontaneously). ?Impression:            - One 2 mm polyp in the cecum, removed with a cold  ?                       snare. Resected and retrieved. ?                       - One 6 mm polyp in the ascending colon, removed with  ?                       a cold snare. Resected and retrieved. ?                       -  Two 3 to 4 mm polyps in the transverse colon,  ?                       removed with a cold snare. Resected and retrieved. ?                       - Diverticulosis in the entire examined colon. ?                       - Non-bleeding internal hemorrhoids. ?Recommendation:        - Discharge patient to home. ?                       - Resume previous  diet. ?                       - Continue present medications. ?                       - Await pathology results. ?                       - Repeat colonoscopy in 5 years for surveillance. ?Procedure Code(s):     --- Professional --- ?                       [phone], Colonoscopy, flexible; with removal of  ?                       tumor(s), polyp(s), or other lesion(s) by snare  ?                       technique ?Diagnosis Code(s):     --- Professional --- ?                       Z86.010, Personal history of colonic polyps ?                       K63.5, Polyp of colon ?CPT copyright 2019 American Medical Association. All rights reserved. ?The codes documented in this report are preliminary and upon coder review may  ?be revised to meet current compliance requirements. ?Lucilla Lame MD, MD ?04/20/2021 11:17:30 AM ?This report has been signed electronically. ?Number of Addenda: 0 ?Note Initiated On: 04/20/2021 10:55 AM ?Scope Withdrawal Time: 0 hours 7 minutes 6 seconds  ?Total Procedure Duration: 0 hours 10 minutes 0 seconds  ?Estimated Blood Loss:  Estimated blood loss: none. ?     [facility] ?

## 2021-04-21 ENCOUNTER — Encounter: Payer: Self-pay | Admitting: Gastroenterology

## 2021-04-21 LAB — SURGICAL PATHOLOGY

## 2021-04-26 ENCOUNTER — Encounter: Payer: Self-pay | Admitting: Cardiovascular Disease

## 2021-04-26 ENCOUNTER — Ambulatory Visit: Payer: Managed Care, Other (non HMO) | Admitting: Cardiovascular Disease

## 2021-04-26 ENCOUNTER — Other Ambulatory Visit: Payer: Self-pay

## 2021-04-26 VITALS — BP 130/90 | HR 101 | Ht 74.0 in | Wt 213.0 lb

## 2021-04-26 DIAGNOSIS — R9431 Abnormal electrocardiogram [ECG] [EKG]: Secondary | ICD-10-CM | POA: Diagnosis not present

## 2021-04-26 DIAGNOSIS — I209 Angina pectoris, unspecified: Secondary | ICD-10-CM

## 2021-04-26 DIAGNOSIS — I700 Atherosclerosis of aorta: Secondary | ICD-10-CM | POA: Diagnosis not present

## 2021-04-26 DIAGNOSIS — Z01810 Encounter for preprocedural cardiovascular examination: Secondary | ICD-10-CM

## 2021-04-26 DIAGNOSIS — E782 Mixed hyperlipidemia: Secondary | ICD-10-CM | POA: Diagnosis not present

## 2021-04-26 MED ORDER — METOPROLOL TARTRATE 100 MG PO TABS: 100.0000 mg | ORAL_TABLET | Freq: Once | ORAL | 0 refills | Status: DC

## 2021-04-26 MED ORDER — IVABRADINE HCL 5 MG PO TABS
15.0000 mg | ORAL_TABLET | Freq: Once | ORAL | 0 refills | Status: AC
Start: 2021-04-26 — End: 2021-04-26

## 2021-04-26 NOTE — Patient Instructions (Signed)
Medication Instructions:  ?No changes ? ?If you need a refill on your cardiac medications before your next appointment, please call your pharmacy.  ? ?Lab work: ?BMP (prior to CCTA) ? ?Testing/Procedures: ?Cardiac CTA  ?ECHO ? ?Follow-Up: ?At [facility], you and your health needs are our priority.  As part of our continuing mission to provide you with exceptional heart care, we have created designated Provider Care Teams.  These Care Teams include your primary Cardiologist (physician) and Advanced Practice Providers (APPs -  Physician Assistants and Nurse Practitioners) who all work together to provide you with the care you need, when you need it. ? ?You will need a follow up appointment as needed ? ?Providers on your designated Care Team:   ?Murray Hodgkins, NP ?Christell Faith, PA-C ?Cadence Kathlen Mody, PA-C ? ?COVID-19 Vaccine Information can be found at: [email or web address] For questions related to vaccine distribution or appointments, please email [email or web address] or call [phone].  ? ? ?Cardiac (coronary) CTA  ? ?[address] ?[place], [place] [address] ?[phone] ? ?Date: 3/20   Time: 9 am ?Please arrive 15 mins early for check-in and test prep. ? ?Please follow these instructions carefully: ? ?Hold all erectile dysfunction medications at least 3 days (72 hrs) prior to test. ? ?On the Night Before the Test: ?Be sure to Drink plenty of water. ?Do not consume any caffeinated/decaffeinated beverages or chocolate 12 hours prior to your test. ?This includes decaf as well ? ?On the Day of the Test: ?Drink plenty of water until 1 hour prior to the test. ?Do not eat any food 4 hours prior to the test. ?You may take your regular medications prior to the test.  ?Take metoprolol (Lopressor) & Corlanor two hours prior to test. ?Lopressor 100 mg ?Corlanor 15 mg ?These were sent in to your pharmacy  for pick-up ?  ?After the Test: ?Drink plenty of water. ?After receiving IV contrast, you may experience a mild flushed feeling. This is normal. ?On occasion, you may experience a mild rash up to 24 hours after the test. This is not dangerous. If this occurs, you can take Benadryl 25 mg and increase your fluid intake (to flush out the kidneys) ?If you experience trouble breathing, this can be serious. If it is severe call 911 IMMEDIATELY. If it is mild, please call our office. ?If you take any of these medications:  ?Glipizide/Metformin ?Avandament,  ?Glucavance,  ?please do not take 48 hours after completing test unless otherwise instructed. ? ? ?Once we have confirmed authorization from your insurance company, we will call you to set up a date and time for your test. Based on how quickly your insurance processes prior authorizations requests, please allow up to 4 weeks to be contacted for scheduling your Cardiac CT appointment. Be advised that routine Cardiac CT appointments could be scheduled as many as 8 weeks after your provider has ordered it. ? ? ?For scheduling needs, including cancellations and rescheduling, please call [place], [phone]. ? ?

## 2021-04-26 NOTE — Progress Notes (Signed)
Cardiology Office Note ? ?Date:  04/26/2021  ? ?ID:  Willie Hess, DOB 08/30/1965, MRN [id number] ? ?PCP:  Pender, Julie F, FNP  ? ?Chief Complaint  ?Patient presents with  ? New Patient (Initial Visit)  ?  Establish care with provider for an abnormal EKG. Medications verbally reviewed with patient.   ? ? ?HPI:  ?[name] is a 56-year-old gentleman with past medical history of ?Smoking, quit 1 yr ago ?Hyperlipidemia ?Prediabetes ?Presenting by referral from Julie Pender for consultation of his abnormal EKG concern for LVH, strong family history coronary disease, congestive heart failure, ? ?On evaluation today reports no regular exercise program ?Heart rate has been running high typically 100 or more ?Gets tired, gives, ?Weight has been trending upwards, concerned about abdominal distention and fluid ? ?Long discussion concerning family history, father with coronary disease, congestive heart failure ? ?EKG March 19, 2021 reviewed, ?Normal sinus rhythm diffuse ST-T wave abnormality, concern for LVH ? ?CT chest March 2023 reviewed ?Emphysema, aortic atherosclerosis ? ?Lab work reviewed ?A1c 5.8 ?Total cholesterol 220s ? ?BP at home 120/80 ? ?EKG personally reviewed by myself on todays visit ?Normal sinus rhythm rate 101 bpm diffuse ST and T wave abnormality anterior precordial leads I and aVL ? ?Lab Results  ?Component Value Date  ? CHOL 225 (H) 03/19/2021  ? HDL 61 03/19/2021  ? LDLCALC 136 (H) 03/19/2021  ? TRIG 160 (H) 03/19/2021  ?  ?Father with CAD, CHF, lived to 72 ? 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 ? ?PSH:    ?Past Surgical History:  ?Procedure Laterality Date  ? COLONOSCOPY WITH PROPOFOL N/A 11/12/2015  ? Procedure: COLONOSCOPY WITH PROPOFOL;  Surgeon: Darren Wohl, MD;  Location: [facility];  Service: Endoscopy;  Laterality: N/A;  ? COLONOSCOPY WITH PROPOFOL N/A 04/20/2021  ? Procedure: COLONOSCOPY WITH PROPOFOL;  Surgeon: Wohl, Darren, MD;  Location: [facility];  Service: Endoscopy;  Laterality: N/A;  ? ESOPHAGOGASTRODUODENOSCOPY    ? INSERTION OF MESH Left 08/24/2017  ? Procedure: INSERTION OF MESH;  Surgeon: Pabon, Diego F, MD;  Location: [facility];  Service: General;  Laterality: Left;  ? POLYPECTOMY  11/12/2015  ? Procedure: POLYPECTOMY;  Surgeon: Darren Wohl, MD;  Location: [facility];  Service: Endoscopy;;  ? ROBOT ASSISTED INGUINAL HERNIA REPAIR Left 08/24/2017  ? Procedure: ROBOT ASSISTED INGUINAL HERNIA REPAIR;  Surgeon: Pabon, Diego F, MD;  Location: [facility];  Service: General;  Laterality: Left;  ? URETEROSCOPY WITH HOLMIUM LASER LITHOTRIPSY Left 09/23/2014  ? Procedure: URETEROSCOPY, CYSTOSCOPY WITH STENT PLACEMENT, HOLMIUM STAND BY ;  Surgeon: Michael R Wolff, MD;  Location: [facility];  Service: Urology;  Laterality: Left;  ? VASECTOMY    ? WISDOM TOOTH EXTRACTION    ? ? ?Current Outpatient Medications  ?Medication Sig Dispense Refill  ? blood glucose meter kit and supplies Dispense based on patient and insurance preference. Use up to four times daily as directed. (FOR ICD-10 E10.9, E11.9). 1 each 0  ? Omeprazole-Sodium Bicarbonate (ZEGERID) 20-1100 MG CAPS capsule Take 2 capsules by mouth daily.     ? Blood Glucose Monitoring Suppl (ACCU-CHEK GUIDE ME) w/Device KIT 1 Device by Does not   apply route once for 1 dose. 1 kit 0  ? ?No current facility-administered medications for this visit.  ? ? ? ?Allergies:   Bee venom  ? ?Social History:  The patient  reports that he quit smoking about 3 years ago. His smoking use included cigarettes. He has a 51.00 pack-year  smoking history. He has never used smokeless tobacco. He reports that he does not drink alcohol and does not use drugs.  ? ?Family History:   family history includes Emphysema in his paternal grandfather; Heart disease in his father, paternal grandmother, and sister; Other in his brother; Thyroid disease in his mother.  ? ? ?Review of Systems: ?Review of Systems  ?Constitutional: Negative.   ?HENT: Negative.    ?Respiratory:  Positive for shortness of breath.   ?Cardiovascular:  Positive for chest pain.  ?Gastrointestinal: Negative.   ?Musculoskeletal: Negative.   ?Neurological: Negative.   ?Psychiatric/Behavioral: Negative.    ?All other systems reviewed and are negative. ? ? ?PHYSICAL EXAM: ?VS:  BP 130/90 (BP Location: Right Arm, Patient Position: Sitting, Cuff Size: Normal)   Pulse (!) 101   Ht 6' 2" (1.88 m)   Wt 213 lb (96.6 kg)   SpO2 97%   BMI 27.35 kg/m?  , BMI Body mass index is 27.35 kg/m?. ?GEN: Well nourished, well developed, in no acute distress ?HEENT: normal ?Neck: no JVD, carotid bruits, or masses ?Cardiac: RRR; no murmurs, rubs, or gallops,no edema  ?Respiratory:  clear to auscultation bilaterally, normal work of breathing ?GI: soft, nontender, nondistended, + BS ?MS: no deformity or atrophy ?Skin: warm and dry, no rash ?Neuro:  Strength and sensation are intact ?Psych: euthymic mood, full affect ? ? ?Recent Labs: ?06/22/2020: TSH 2.240 ?03/19/2021: ALT 19; BNP 312.5; BUN 12; Creatinine, Ser 1.17; Hemoglobin 17.2; Platelets 243; Potassium 4.3; Sodium 139  ? ? ?Lipid Panel ?Lab Results  ?Component Value Date  ? CHOL 225 (H) 03/19/2021  ? HDL 61 03/19/2021  ? LDLCALC 136 (H) 03/19/2021  ? TRIG 160 (H) 03/19/2021  ? ?  ? ?Wt Readings from Last 3 Encounters:  ?04/26/21 213 lb (96.6 kg)  ?04/20/21 210 lb (95.3 kg)  ?03/19/21 214 lb 1.6 oz (97.1 kg)  ?  ? ? ? ?ASSESSMENT AND PLAN: ? ?Problem List Items Addressed This Visit   ? ?  ? Cardiology Problems  ? Hyperlipidemia  ? ?Other Visit Diagnoses   ? ?  Aortic atherosclerosis ([place])    -  Primary  ? Abnormal EKG      ? ?  ? ? ?Angina/fatigue/shortness of breath ?Markedly abnormal EKG concerning for anterolateral ischemia unable to exclude LVH with repolarization abnormality ?Strong family history coronary disease father and other family members ?Risk factors include prior smoking history, borderline diabetes, hyperlipidemia ?We will order cardiac CTA for ischemic work-up ?Echocardiogram ordered given markedly abnormal EKG and symptoms of shortness of breath, tightness, abdominal distention, family history ? ?No medication changes made at this time ? ? ? Total encounter time more than 60 minutes ? Greater than 50% was spent in counseling and coordination of care with the patient ? ? ?Patient seen in consultation for Serafina Royals and will be referred back to her office for ongoing care of the issues detailed above ? ? ?Signed, ?Esmond Plants, M.D., Ph.D. ?[facility] [place], [place] ?[phone] ?

## 2021-04-27 LAB — BASIC METABOLIC PANEL
BUN/Creatinine Ratio: 10 (ref 9–20)
BUN: 11 mg/dL (ref 6–24)
CO2: 20 mmol/L (ref 20–29)
Calcium: 9.6 mg/dL (ref 8.7–10.2)
Chloride: 106 mmol/L (ref 96–106)
Creatinine, Ser: 1.12 mg/dL (ref 0.76–1.27)
Glucose: 95 mg/dL (ref 70–99)
Potassium: 4.6 mmol/L (ref 3.5–5.2)
Sodium: 140 mmol/L (ref 134–144)
eGFR: 78 mL/min/{1.73_m2} (ref >59)

## 2021-04-28 ENCOUNTER — Telehealth (HOSPITAL_COMMUNITY): Payer: Self-pay | Admitting: Emergency Medicine

## 2021-04-28 ENCOUNTER — Telehealth: Payer: Self-pay

## 2021-04-28 NOTE — Telephone Encounter (Signed)
Unable to leave vm (full) ?Marchia Bond RN Navigator Cardiac Imaging ?[facility] ?[phone] Office  ?[phone] Cell ? ?

## 2021-04-28 NOTE — Telephone Encounter (Signed)
Patient has never had a sleep study before. Nothing further needed.  ?

## 2021-04-29 ENCOUNTER — Ambulatory Visit (INDEPENDENT_AMBULATORY_CARE_PROVIDER_SITE_OTHER): Payer: Managed Care, Other (non HMO) | Admitting: Adult Health

## 2021-04-29 ENCOUNTER — Encounter: Payer: Self-pay | Admitting: Adult Health

## 2021-04-29 ENCOUNTER — Ambulatory Visit
Admission: RE | Admit: 2021-04-29 | Discharge: 2021-04-29 | Disposition: A | Discharge status: Home / Self Care | Payer: Managed Care, Other (non HMO) | Source: Ambulatory Visit | Attending: Cardiovascular Disease | Admitting: Cardiovascular Disease

## 2021-04-29 ENCOUNTER — Other Ambulatory Visit: Payer: Self-pay

## 2021-04-29 DIAGNOSIS — G4719 Other hypersomnia: Secondary | ICD-10-CM

## 2021-04-29 DIAGNOSIS — I209 Angina pectoris, unspecified: Secondary | ICD-10-CM | POA: Insufficient documentation

## 2021-04-29 DIAGNOSIS — R9431 Abnormal electrocardiogram [ECG] [EKG]: Secondary | ICD-10-CM | POA: Insufficient documentation

## 2021-04-29 MED ORDER — METOPROLOL TARTRATE 5 MG/5ML IV SOLN
10.0000 mg | Freq: Once | INTRAVENOUS | Status: AC
  Administered 2021-04-29: 10 mg via INTRAVENOUS

## 2021-04-29 MED ORDER — IOHEXOL 350 MG/ML SOLN
75.0000 mL | Freq: Once | INTRAVENOUS | Status: AC | PRN
Start: 2021-04-29 — End: 2021-04-29
  Administered 2021-04-29: 75 mL via INTRAVENOUS

## 2021-04-29 MED ORDER — DILTIAZEM HCL 25 MG/5ML IV SOLN
10.0000 mg | Freq: Once | INTRAVENOUS | Status: AC
  Administered 2021-04-29: 10 mg via INTRAVENOUS

## 2021-04-29 NOTE — Progress Notes (Signed)
Patient arrived for Cardiac CT and took pre meds Metoprolol 100 mg PO and Ivabradine 15 mg PO. Heat rate high 70.s and low 80's. Administered total Metoprolol 20 mg IVP and Cardizem 20 mg IVP. Heart rate remained the same and patient states heart rate is usually around 100. Notified Dr Garen Lah and ordered Cardiac CT to be cancel and will inform Dr Rockey Situ. Patient verbalized understanding of plan of care.  ?

## 2021-04-29 NOTE — Patient Instructions (Signed)
Set up for home sleep study  Healthy sleep regimen.  Do not drive if sleepy  Follow up in 6 weeks to discuss results and treatment plan  

## 2021-04-29 NOTE — Assessment & Plan Note (Signed)
Daytime sleepiness, snoring, witnessed apneic events all suspicious for underlying sleep apnea ?Patient education on healthy sleep regimen, sleep apnea and potential complications of untreated sleep apnea ? ?- discussed how weight can impact sleep and risk for sleep disordered breathing ?- discussed options to assist with weight loss: combination of diet modification, cardiovascular and strength training exercises ?  ?- had an extensive discussion regarding the adverse health consequences related to untreated sleep disordered breathing ?- specifically discussed the risks for hypertension, coronary artery disease, cardiac dysrhythmias, cerebrovascular disease, and diabetes ?- lifestyle modification discussed ?  ?- discussed how sleep disruption can increase risk of accidents, particularly when driving ?- safe driving practices were discussed ?  ?Plan  ?Patient Instructions  ?Set up for home sleep study  ?Healthy sleep regimen  ?Do not drive if sleepy .  ?Follow up in 6 weeks to discuss results and treatment plan .  ?  ? ?

## 2021-04-29 NOTE — Progress Notes (Signed)
? ?_0  ID: Willie Hess, male    DOB: [birth year], 56 y.o.   MRN: [id number] ? ?Chief Complaint  ?Patient presents with  ? Consult  ? ? ?Referring provider: ?No ref. provider found ? ?HPI: ?56 year old male presents for sleep consult April 29, 2021 with restless sleep daytime sleepiness and snoring. ? ?TEST/EVENTS :  ? ?04/29/2021 Sleep Consult  ?Patient presents for a sleep consult.  He was referred by his primary care provider.  Patient complains of restless sleep snoring daytime sleepiness.  Epworth score is 16.  Patient gets significant sleepiness with an activity such as sitting still watching TV.  Patient has associated loud snoring , witnessed apneic events and very fragmented restless sleep.  No symptoms suspicious for cataplexy or sleep paralysis.  Patient typically goes to bed about 10 PM takes about an hour to go to sleep.  Is up multiple times throughout the night.  And gets up about 6:30 AM.  Patient does not operate heavy machinery.  Weight is up about 15 pounds over the last 2 years.  Patient is never had a sleep study in the past. ?Caffeine intake 1 large coffee in am . 3 sodas a day .  ? ?Social history patient quit smoking 2 years ago.  Quit drinking alcohol 5 years ago.  Patient is married.  Does have 2 children.  Works as a Surveyor, minerals at [employer] . No drugs .  ? ?Family history is positive for emphysema in his grandfather and heart disease in father and uncle. ? ?Surgical history : Right inguinal hernia repair .  ? ?Medical history significant for borderline hypertension , GERD, prediabetes and hyperlipidemia, kidney stones  ? ?Allergies  ?Allergen Reactions  ? Bee Venom Swelling  ? ? ?Immunization History  ?Administered Date(s) Administered  ? Influenza,inj,Quad PF,6+ Mos 12/11/2014, 10/27/2015, 10/11/2016, 10/07/2019  ? Tdap 10/11/2016  ? ? ?Past Medical History:  ?Diagnosis Date  ? Arthritis   ? Benign neoplasm of ascending colon   ? Benign neoplasm of cecum   ? Benign neoplasm of sigmoid  colon   ? Benign neoplasm of transverse colon   ? Decreased creatinine clearance 12/11/2014  ? 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 GERD (gastroesophageal reflux disease)   ? GERD without esophagitis 12/11/2014  ? Headache   ? migraines 1x/6 mos (lesser HAs more often)  ? History of kidney stones   ? h/o  ? Hx of smoking 07/27/2017  ? Lyme disease   ? Prediabetes 09/25/2017  ? Preventative health care 10/12/2015  ? ? ?Tobacco History: ?Social History  ? ?Tobacco Use  ?Smoking Status Former  ? Packs/day: 1.00  ? Years: 51.00  ? Pack years: 51.00  ? Types: Cigarettes  ? Quit date: 2020  ? Years since quitting: 3.2  ?Smokeless Tobacco Never  ? ?Counseling given: Not Answered ? ? ?Outpatient Medications Prior to Visit  ?Medication Sig Dispense Refill  ? blood glucose meter kit and supplies Dispense based on patient and insurance preference. Use up to four times daily as directed. (FOR ICD-10 E10.9, E11.9). 1 each 0  ? Blood Glucose Monitoring Suppl (ACCU-CHEK GUIDE ME) w/Device KIT 1 Device by Does not apply route once for 1 dose. 1 kit 0  ? metoprolol tartrate (LOPRESSOR) 100 MG tablet Take 1 tablet (100 mg total) by mouth once for 1 dose. Take 2 hours before your Cardiac CTA procedure 1 tablet 0  ? Omeprazole-Sodium Bicarbonate (ZEGERID) 20-1100 MG CAPS capsule Take 2 capsules by mouth daily.     ? ?  No facility-administered medications prior to visit.  ? ? ? ?Review of Systems:  ? ?Constitutional:   No  weight loss, night sweats,  Fevers, chills,  ?+fatigue, or  lassitude. ? ?HEENT:   No headaches,  Difficulty swallowing,  Tooth/dental problems, or  Sore throat,  ?              No sneezing, itching, ear ache, nasal congestion, post nasal drip,  ? ?CV:  No chest pain,  Orthopnea, PND, swelling in lower extremities, anasarca, dizziness, palpitations, syncope.  ? ?GI  No heartburn, indigestion, abdominal pain, nausea, vomiting, diarrhea, change in bowel habits, loss of appetite, bloody stools.  ? ?Resp: No shortness of breath with  exertion or at rest.  No excess mucus, no productive cough,  No non-productive cough,  No coughing up of blood.  No change in color of mucus.  No wheezing.  No chest wall deformity ? ?Skin: no rash or lesions. ? ?GU: no dysuria, change in color of urine, no urgency or frequency.  No flank pain, no hematuria  ? ?MS:  No joint pain or swelling.  No decreased range of motion.  No back pain. ? ? ? ?Physical Exam ? ?BP 120/82 (BP Location: Left Arm, Patient Position: Sitting, Cuff Size: Normal)   Pulse (!) 108   Temp 97.8 ?F (36.6 ?C) (Oral)   Ht _0  (1.88 m)   Wt 216 lb (98 kg)   SpO2 97%   BMI 27.73 kg/m?  ? ?GEN: A/Ox3; pleasant , NAD, well nourished  ?  ?HEENT:  [place]/AT,   NOSE-clear, THROAT-clear, no lesions, no postnasal drip or exudate noted. Class 2 MP airway  ? ?NECK:  Supple w/ fair ROM; no JVD; normal carotid impulses w/o bruits; no thyromegaly or nodules palpated; no lymphadenopathy.   ? ?RESP  Clear  P & A; w/o, wheezes/ rales/ or rhonchi. no accessory muscle use, no dullness to percussion ? ?CARD:  RRR, no m/r/g, no peripheral edema, pulses intact, no cyanosis or clubbing. ? ?GI:   Soft & nt; nml bowel sounds; no organomegaly or masses detected.  ? ?Musco: Warm bil, no deformities or joint swelling noted.  ? ?Neuro: alert, no focal deficits noted.   ? ?Skin: Warm, no lesions or rashes ? ? ? ?Lab Results: ? ?CBC ? ? ? ? ?BNP ? ? ?ProBNP ?No results found for: PROBNP ? ?Imaging: ?CT CHEST LUNG CA SCREEN LOW DOSE W/O CM ? ?Result Date: 04/16/2021 ?CLINICAL DATA:  Former smoker with 51 pack-year history EXAM: CT CHEST WITHOUT CONTRAST LOW-DOSE FOR LUNG CANCER SCREENING TECHNIQUE: Multidetector CT imaging of the chest was performed following the standard protocol without IV contrast. RADIATION DOSE REDUCTION: This exam was performed according to the departmental dose-optimization program which includes automated exposure control, adjustment of the mA and/or kV according to patient size and/or use of  iterative reconstruction technique. COMPARISON:  CT lung cancer screening dated October 31, 2019 FINDINGS: Cardiovascular: Normal heart size. No pericardial effusion. Mild atherosclerotic disease of the thoracic aorta. Mediastinum/Nodes: Small hiatal hernia. No pathologically enlarged lymph nodes seen in the chest. Lungs/Pleura: Central airways are patent. Mild bilateral bronchial wall thickening. Paraseptal and centrilobular emphysema. No consolidation, pleural effusion or pneumothorax. Stable small solid pulmonary nodules. Reference solid pulmonary nodule measuring 5.9 mm in mean diameter located in the right upper lobe on image 89. Upper Abdomen: No acute abnormality. Musculoskeletal: No chest wall mass or suspicious bone lesions identified. IMPRESSION: 1. Lung-RADS 2, benign appearance or behavior. Continue  annual screening with low-dose chest CT without contrast in 12 months. 2. Aortic Atherosclerosis (ICD10-I70.0) and Emphysema (ICD10-J43.9). Electronically Signed   By: Yetta Glassman M.D.   On: 04/16/2021 10:31  ? ? ? ?No flowsheet data found. ? ?No results found for: NITRICOXIDE ? ? ? ? ? ?Assessment & Plan:  ? ?Excessive daytime sleepiness ?Daytime sleepiness, snoring, witnessed apneic events all suspicious for underlying sleep apnea ?Patient education on healthy sleep regimen, sleep apnea and potential complications of untreated sleep apnea ? ?- discussed how weight can impact sleep and risk for sleep disordered breathing ?- discussed options to assist with weight loss: combination of diet modification, cardiovascular and strength training exercises ?  ?- had an extensive discussion regarding the adverse health consequences related to untreated sleep disordered breathing ?- specifically discussed the risks for hypertension, coronary artery disease, cardiac dysrhythmias, cerebrovascular disease, and diabetes ?- lifestyle modification discussed ?  ?- discussed how sleep disruption can increase risk of  accidents, particularly when driving ?- safe driving practices were discussed ?  ?Plan  ?Patient Instructions  ?Set up for home sleep study  ?Healthy sleep regimen  ?Do not drive if sleepy .  ?Follow up in 6 weeks to d

## 2021-04-29 NOTE — Progress Notes (Signed)
Reviewed and agree with assessment/plan. ? ? ?Chesley Mires, MD ?[place] ?04/29/2021, 1:02 PM ?Pager:  [phone] ? ?

## 2021-05-03 ENCOUNTER — Other Ambulatory Visit: Payer: Self-pay

## 2021-05-03 ENCOUNTER — Ambulatory Visit: Admission: RE | Admit: 2021-05-03 | Payer: Managed Care, Other (non HMO) | Source: Ambulatory Visit

## 2021-05-03 DIAGNOSIS — I209 Angina pectoris, unspecified: Secondary | ICD-10-CM

## 2021-05-03 DIAGNOSIS — R079 Chest pain, unspecified: Secondary | ICD-10-CM

## 2021-05-03 DIAGNOSIS — R9431 Abnormal electrocardiogram [ECG] [EKG]: Secondary | ICD-10-CM

## 2021-05-03 MED ORDER — METOPROLOL SUCCINATE ER 50 MG PO TB24: 50.0000 mg | ORAL_TABLET | Freq: Every day | ORAL | 3 refills | Status: DC

## 2021-05-03 NOTE — Progress Notes (Unsigned)
Message sent from Dr. Rockey Situ ? ? ?Cardiac CTA team unable to perform CT scan as heart rate was too elevated  ?They requested we perform a different study  ?Options for him include get echocardiogram first then perform a lexiscan Myoview  ?We can go ahead and order Myoview now and get both done  ?Also would recommend given his high heart rate which was very hard to get down that he start metoprolol succinate 50 daily  ?Lower heart rate might help with some of his symptoms  ?TG  ? ?ECHO 3/30 at 08:30 am ? ?Metoprolol succinate 50 mg Qd sent in to [facility]  ? ?Mychart message sent to pt regarding instructions  ? ?Cardiac CTA cancel at this time ?

## 2021-05-13 ENCOUNTER — Ambulatory Visit (INDEPENDENT_AMBULATORY_CARE_PROVIDER_SITE_OTHER): Payer: Managed Care, Other (non HMO)

## 2021-05-13 DIAGNOSIS — R9431 Abnormal electrocardiogram [ECG] [EKG]: Secondary | ICD-10-CM | POA: Diagnosis not present

## 2021-05-13 LAB — ECHOCARDIOGRAM COMPLETE
AR max vel: 1.7 cm2
AV Area VTI: 1.74 cm2
AV Area mean vel: 1.63 cm2
AV Mean grad: 4 mmHg
AV Peak grad: 6 mmHg
Ao pk vel: 1.22 m/s
Calc EF: 38.6 %
MV M vel: 4.4 m/s
MV Peak grad: 77.4 mmHg
P 1/2 time: 373 msec
S' Lateral: 5.6 cm
Single Plane A2C EF: 45.4 %
Single Plane A4C EF: 31.7 %

## 2021-05-18 ENCOUNTER — Encounter: Payer: Self-pay | Admitting: Cardiovascular Disease

## 2021-05-18 ENCOUNTER — Telehealth: Payer: Self-pay | Admitting: Emergency Medicine

## 2021-05-18 ENCOUNTER — Ambulatory Visit: Payer: Managed Care, Other (non HMO) | Admitting: Cardiovascular Disease

## 2021-05-18 VITALS — BP 110/80 | HR 111 | Ht 73.5 in | Wt 213.4 lb

## 2021-05-18 DIAGNOSIS — I420 Dilated cardiomyopathy: Secondary | ICD-10-CM

## 2021-05-18 MED ORDER — ASPIRIN EC 81 MG PO TBEC: 81.0000 mg | DELAYED_RELEASE_TABLET | Freq: Every day | ORAL | 3 refills | Status: DC

## 2021-05-18 MED ORDER — ROSUVASTATIN CALCIUM 20 MG PO TABS: 20.0000 mg | ORAL_TABLET | Freq: Every day | ORAL | 3 refills | Status: DC

## 2021-05-18 MED ORDER — METOPROLOL SUCCINATE ER 25 MG PO TB24: 25.0000 mg | ORAL_TABLET | Freq: Every day | ORAL | 3 refills | Status: DC

## 2021-05-18 NOTE — Telephone Encounter (Signed)
Called patient.  ? ?Patient had reviewed results on MyChart, MD comments were released by MD.  ? ?Patient had no questions regarding results and verbalized understanding.  ? ?Explained that Dr. Rockey Situ would like to have him come in for appointment to discuss cath, pt verbalized understanding.  ? ?Pt scheduled for this afternoon, 4/4, at 3:20.  ? ?Pt verbalized understanding and voiced appreciation for the call.  ?

## 2021-05-18 NOTE — Patient Instructions (Signed)
Medication Instructions:  ?Please start metoprolol succinate 25 mg daily ?Start Aspirin 81 mg daily ?Please start crestor 20 mg daily ? ?If you need a refill on your cardiac medications before your next appointment, please call your pharmacy.  ? ?Lab work: ?Today: CBC, BMP ? ?Testing/Procedures: ? ?You are scheduled for a Cardiac Catheterization on Tuesday, April 11 with Dr. Harrell Gave End. ? ?1. Please arrive at the [facility] at [address] [place], [place] [address] at 8:30 AM (This time is one hour before your procedure to ensure your preparation). Free valet parking service is available.  ? ?Special note: Every effort is made to have your procedure done on time. Please understand that emergencies sometimes delay scheduled procedures. ? ?2. Diet: Do not eat solid foods after midnight.  You may have clear liquids until 5 AM upon the day of the procedure. ? ?3. Labs: You will need to have blood drawn on Tuesday, April 4 at [facility] ? ?4. Medication instructions in preparation for your procedure: ? ? Contrast Allergy: No ? ?On the morning of your procedure, take Aspirin and any morning medicines NOT listed above.  You may use sips of water. ? ?5. Plan to go home the same day, you will only stay overnight if medically necessary. ?6. You MUST have a responsible adult to drive you home. ?7. An adult MUST be with you the first 24 hours after you arrive home. ?8. Bring a current list of your medications, and the last time and date medication taken. ?9. Bring ID and current insurance cards. ?10.Please wear clothes that are easy to get on and off and wear slip-on shoes. ? ?Thank you for allowing [place] to care for you! ?  -- [facility]  ? ?Follow-Up: ?At [facility], you and your health needs are our priority.  As part of our continuing mission to provide you with exceptional heart care, we have created designated Provider Care Teams.  These Care Teams  include your primary Cardiologist (physician) and Advanced Practice Providers (APPs -  Physician Assistants and Nurse Practitioners) who all work together to provide you with the care you need, when you need it. ? ?You will need a follow up appointment in 1 month ? ?Providers on your designated Care Team:   ?Murray Hodgkins, NP ?Christell Faith, PA-C ?Cadence Kathlen Mody, PA-C ? ?COVID-19 Vaccine Information can be found at: [email or web address] For questions related to vaccine distribution or appointments, please email [email or web address] or call [phone].  ? ?

## 2021-05-18 NOTE — Progress Notes (Signed)
Cardiology Office Note ? ?Date:  05/18/2021  ? ?ID:  ARIEN Hess, DOB 1965-02-28, MRN [id number] ? ?PCP:  Bo Merino, FNP  ? ?Chief Complaint  ?Patient presents with  ? Follow up Echo  ?  Discuss having a cardiac cath. Medications reviewed by the patient verbally.   ? ? ?HPI:  ?[name] is a 56 year old gentleman with past medical history of ?Long history of smoking, quit 1 yr ago ?Hyperlipidemia, untreated ?Prediabetes ?Presenting for f/u of his abnormal EKG,  strong family history coronary disease, congestive heart failure,cardiomyopathy ejection fraction 20 to 25%, shortness of breath ? ?Presents today to discuss recent cardiomyopathy findings on echocardiogram ?On last clinic visit reported fatigue, shortness of breath, abdominal distention, fluid retention ? ?Strong family history of cardiac disease, most of his relatives ? ?Echo 05/13/21 results reviewed ? 1. Left ventricular ejection fraction, by estimation, is 20 to 25%. The  ?left ventricle has severely decreased function. The left ventricle  ?demonstrates global hypokinesis. The left ventricular internal cavity size  ?was moderately dilated. Left  ?ventricular diastolic parameters are consistent with Grade I diastolic  ?dysfunction (impaired relaxation).  ?  Left atrial size was moderately dilated.  ? ?EKG personally reviewed by myself on todays visit ?Sinus tachycardia rate 111 bpm diffuse ST-T wave abnormality precordial leads, 1 and aVL ? ?Other past medical history reviewed ? ?CT chest March 2023 reviewed ?Emphysema, aortic atherosclerosis ? ?Lab work reviewed ?A1c 5.8 ?Total cholesterol 225 ? ?Lab Results  ?Component Value Date  ? CHOL 225 (H) 03/19/2021  ? HDL 61 03/19/2021  ? LDLCALC 136 (H) 03/19/2021  ? TRIG 160 (H) 03/19/2021  ?  ?Father with CAD, CHF, lived to 30 ? 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 ? ?PSH:    ?Past Surgical History:  ?Procedure Laterality Date  ? COLONOSCOPY WITH PROPOFOL N/A 11/12/2015  ? Procedure: COLONOSCOPY WITH PROPOFOL;  Surgeon: Lucilla Lame, MD;  Location: [facility];  Service: Endoscopy;  Laterality: N/A;  ? COLONOSCOPY WITH PROPOFOL N/A 04/20/2021  ? Procedure: COLONOSCOPY WITH PROPOFOL;  Surgeon: Lucilla Lame, MD;  Location[facility] END[facility]COPY;  Service: Endoscopy;  Laterality: N/A;  ? ESOPHAGOGASTRODUODENOSCOPY    ? INSERTION OF MESH Left 08/24/2017  ? Procedure: INSERTION OF MESH;  Surgeon: Jules Husbands, MD;  Location: [facility];  Service: General;  Laterality: Left;  ? POLYPECTOMY  11/12/2015  ? Procedure: POLYPECTOMY;  Surgeon: Lucilla Lame, MD;  Location: [facility];  Service: Endoscopy;;  ? ROBOT ASSISTED INGUINAL HERNIA REPAIR Left 08/24/2017  ? Procedure: ROBOT ASSISTED INGUINAL HERNIA REPAIR;  Surgeon: Jules Husbands, MD;  Location: [facility] ORS;  Service: General;  Laterality: Left;  ? URETEROSCOPY WITH HOLMIUM LASER LITHOTRIPSY Left 09/23/2014  ? Procedure: URETEROSCOPY, CYSTOSCOPY WITH STENT PLACEMENT, HOLMIUM STAND BY ;  Surgeon: Royston Cowper, MD;  Location: [facility];  Service: Urology;  Laterality: Left;  ? VASECTOMY    ? WISDOM TOOTH EXTRACTION    ? ? ?Current Outpatient Medications  ?Medication Sig Dispense Refill  ? aspirin EC 81 MG tablet Take 1 tablet (81 mg total) by mouth daily. Swallow whole. 90 tablet 3  ? blood glucose meter  kit and supplies Dispense based on patient and insurance preference. Use up to four times daily as directed. (FOR ICD-10 E10.9, E11.9). 1 each 0  ? Blood Glucose Monitoring Suppl (ACCU-CHEK GUIDE ME) w/Device KIT 1 Device by Does not  apply route once for 1 dose. 1 kit 0  ? metoprolol succinate (TOPROL XL) 25 MG 24 hr tablet Take 1 tablet (25 mg total) by mouth daily. 90 tablet 3  ? Omeprazole-Sodium Bicarbonate (ZEGERID) 20-1100 MG CAPS capsule Take 2 capsules by mouth daily.     ? rosuvastatin (CRESTOR) 20 MG tablet Take 1 tablet (20 mg total) by mouth daily. 90 tablet 3  ? ?No current facility-administered medications for this visit.  ? ? ? ?Allergies:   Bee venom  ? ?Social History:  The patient  reports that he quit smoking about 3 years ago. His smoking use included cigarettes. He has a 51.00 pack-year smoking history. He has never used smokeless tobacco. He reports that he does not drink alcohol and does not use drugs.  ? ?Family History:   family history includes Emphysema in his paternal grandfather; Heart disease in his father, paternal grandmother, and sister; Other in his brother; Thyroid disease in his mother.  ? ? ?Review of Systems: ?Review of Systems  ?Constitutional:  Positive for malaise/fatigue.  ?HENT: Negative.    ?Respiratory:  Positive for shortness of breath.   ?Cardiovascular: Negative.   ?Gastrointestinal: Negative.   ?Musculoskeletal: Negative.   ?Neurological: Negative.   ?Psychiatric/Behavioral: Negative.    ?All other systems reviewed and are negative. ? ? ?PHYSICAL EXAM: ?VS:  BP 110/80 (BP Location: Left Arm, Patient Position: Sitting, Cuff Size: Normal)   Pulse (!) 111   Ht 6' 1.5" (1.867 m)   Wt 96.8 kg   SpO2 98%   BMI 27.77 kg/m?  , BMI Body mass index is 27.77 kg/m?. ?GEN: Well nourished, well developed, in no acute distress ?HEENT: normal ?Neck: no JVD, carotid bruits, or masses ?Cardiac: RRR; no murmurs, rubs, or gallops,no edema  ?Respiratory:  clear to auscultation bilaterally, normal work of breathing ?GI: soft, nontender, nondistended, + BS ?MS: no deformity or atrophy ?Skin: warm and dry, no rash ?Neuro:  Strength and sensation are intact ?Psych: euthymic mood, full affect ? ?Recent  Labs: ?06/22/2020: TSH 2.240 ?03/19/2021: ALT 19; BNP 312.5; Hemoglobin 17.2; Platelets 243 ?04/26/2021: BUN 11; Creatinine, Ser 1.12; Potassium 4.6; Sodium 140  ? ? ?Lipid Panel ?Lab Results  ?Component Value Date  ? CHOL 225 (H) 03/19/2021  ? HDL 61 03/19/2021  ? LDLCALC 136 (H) 03/19/2021  ? TRIG 160 (H) 03/19/2021  ? ?  ? ?Wt Readings from Last 3 Encounters:  ?05/18/21 96.8 kg  ?04/29/21 98 kg  ?04/26/21 96.6 kg  ?  ? ? ?ASSESSMENT AND PLAN: ? ?Problem List Items Addressed This Visit   ?None ?Visit Diagnoses   ? ? Dilated cardiomyopathy ([place])    -  Primary  ? Relevant Medications  ? metoprolol succinate (TOPROL XL) 25 MG 24 hr tablet  ? rosuvastatin (CRESTOR) 20 MG tablet  ? aspirin EC 81 MG tablet  ? Other Relevant Orders  ? EKG 12-Lead  ? CBC  ? Basic Metabolic Panel (BMET)  ? ?  ? ?Cardiomyopathy ?Significant concern for underlying ischemic cardiomyopathy given strong family history, hyperlipidemia, long smoking history, abnormal EKG ?Also with symptoms of shortness of breath, concern for angina ?-We have recommended cardiac catheterization to rule out ischemic heart disease ?-After discussion of his schedule, we have tentatively  scheduled this in 1 week's time ?-Recommend he start aspirin 81 daily, statin as below, metoprolol succinate 25 mg daily ?I have reviewed the risks, indications, and alternatives to cardiac catheterization, possible angioplasty, and stenting with the patient. Risks include but are not limited to bleeding, infection, vascular injury, stroke, myocardial infection, arrhythmia, kidney injury, radiation-related injury in the case of prolonged fluoroscopy use, emergency cardiac surgery, and death. The patient understands the risks of serious complication is 1-2 in [id number] with diagnostic cardiac cath and 1-2% or less with angioplasty/stenting.  ? ?-- Hyperlipidemia ?Recommend he start Crestor 20 mg daily ? ?Sinus tachycardia ?In the setting of cardiomyopathy ?Suspect secondary to low output  failure ?Metoprolol succinate 25 daily as above ?We did discuss other medications that can be used for cardiomyopathy, will wait until catheterization results available ? ? ? Total encounter time more than 50 minutes ? Greater than 50% w

## 2021-05-18 NOTE — H&P (View-Only) (Signed)
Cardiology Office Note ? ?Date:  05/18/2021  ? ?ID:  Willie Hess, DOB 1965-02-28, MRN [id number] ? ?PCP:  Bo Merino, FNP  ? ?Chief Complaint  ?Patient presents with  ? Follow up Echo  ?  Discuss having a cardiac cath. Medications reviewed by the patient verbally.   ? ? ?HPI:  ?[name] is a 56 year old gentleman with past medical history of ?Long history of smoking, quit 1 yr ago ?Hyperlipidemia, untreated ?Prediabetes ?Presenting for f/u of his abnormal EKG,  strong family history coronary disease, congestive heart failure,cardiomyopathy ejection fraction 20 to 25%, shortness of breath ? ?Presents today to discuss recent cardiomyopathy findings on echocardiogram ?On last clinic visit reported fatigue, shortness of breath, abdominal distention, fluid retention ? ?Strong family history of cardiac disease, most of his relatives ? ?Echo 05/13/21 results reviewed ? 1. Left ventricular ejection fraction, by estimation, is 20 to 25%. The  ?left ventricle has severely decreased function. The left ventricle  ?demonstrates global hypokinesis. The left ventricular internal cavity size  ?was moderately dilated. Left  ?ventricular diastolic parameters are consistent with Grade I diastolic  ?dysfunction (impaired relaxation).  ?  Left atrial size was moderately dilated.  ? ?EKG personally reviewed by myself on todays visit ?Sinus tachycardia rate 111 bpm diffuse ST-T wave abnormality precordial leads, 1 and aVL ? ?Other past medical history reviewed ? ?CT chest March 2023 reviewed ?Emphysema, aortic atherosclerosis ? ?Lab work reviewed ?A1c 5.8 ?Total cholesterol 225 ? ?Lab Results  ?Component Value Date  ? CHOL 225 (H) 03/19/2021  ? HDL 61 03/19/2021  ? LDLCALC 136 (H) 03/19/2021  ? TRIG 160 (H) 03/19/2021  ?  ?Father with CAD, CHF, lived to 30 ? 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 ? ?PSH:    ?Past Surgical History:  ?Procedure Laterality Date  ? COLONOSCOPY WITH PROPOFOL N/A 11/12/2015  ? Procedure: COLONOSCOPY WITH PROPOFOL;  Surgeon: Lucilla Lame, MD;  Location: [place];  Service: Endoscopy;  Laterality: N/A;  ? COLONOSCOPY WITH PROPOFOL N/A 04/20/2021  ? Procedure: COLONOSCOPY WITH PROPOFOL;  Surgeon: Lucilla Lame, MD;  Location[facility] ENDOSCOPY;  Service: Endoscopy;  Laterality: N/A;  ? ESOPHAGOGASTRODUODENOSCOPY    ? INSERTION OF MESH Left 08/24/2017  ? Procedure: INSERTION OF MESH;  Surgeon: Jules Husbands, MD;  Location: [facility];  Service: General;  Laterality: Left;  ? POLYPECTOMY  11/12/2015  ? Procedure: POLYPECTOMY;  Surgeon: Lucilla Lame, MD;  Location: [place];  Service: Endoscopy;;  ? ROBOT ASSISTED INGUINAL HERNIA REPAIR Left 08/24/2017  ? Procedure: ROBOT ASSISTED INGUINAL HERNIA REPAIR;  Surgeon: Jules Husbands, MD;  Location: [facility];  Service: General;  Laterality: Left;  ? URETEROSCOPY WITH HOLMIUM LASER LITHOTRIPSY Left 09/23/2014  ? Procedure: URETEROSCOPY, CYSTOSCOPY WITH STENT PLACEMENT, HOLMIUM STAND BY ;  Surgeon: Royston Cowper, MD;  Location: [facility];  Service: Urology;  Laterality: Left;  ? VASECTOMY    ? WISDOM TOOTH EXTRACTION    ? ? ?Current Outpatient Medications  ?Medication Sig Dispense Refill  ? aspirin EC 81 MG tablet Take 1 tablet (81 mg total) by mouth daily. Swallow whole. 90 tablet 3  ? blood glucose meter  kit and supplies Dispense based on patient and insurance preference. Use up to four times daily as directed. (FOR ICD-10 E10.9, E11.9). 1 each 0  ? Blood Glucose Monitoring Suppl (ACCU-CHEK GUIDE ME) w/Device KIT 1 Device by Does not  apply route once for 1 dose. 1 kit 0  ? metoprolol succinate (TOPROL XL) 25 MG 24 hr tablet Take 1 tablet (25 mg total) by mouth daily. 90 tablet 3  ? Omeprazole-Sodium Bicarbonate (ZEGERID) 20-1100 MG CAPS capsule Take 2 capsules by mouth daily.     ? rosuvastatin (CRESTOR) 20 MG tablet Take 1 tablet (20 mg total) by mouth daily. 90 tablet 3  ? ?No current facility-administered medications for this visit.  ? ? ? ?Allergies:   Bee venom  ? ?Social History:  The patient  reports that he quit smoking about 3 years ago. His smoking use included cigarettes. He has a 51.00 pack-year smoking history. He has never used smokeless tobacco. He reports that he does not drink alcohol and does not use drugs.  ? ?Family History:   family history includes Emphysema in his paternal grandfather; Heart disease in his father, paternal grandmother, and sister; Other in his brother; Thyroid disease in his mother.  ? ? ?Review of Systems: ?Review of Systems  ?Constitutional:  Positive for malaise/fatigue.  ?HENT: Negative.    ?Respiratory:  Positive for shortness of breath.   ?Cardiovascular: Negative.   ?Gastrointestinal: Negative.   ?Musculoskeletal: Negative.   ?Neurological: Negative.   ?Psychiatric/Behavioral: Negative.    ?All other systems reviewed and are negative. ? ? ?PHYSICAL EXAM: ?VS:  BP 110/80 (BP Location: Left Arm, Patient Position: Sitting, Cuff Size: Normal)   Pulse (!) 111   Ht 6' 1.5" (1.867 m)   Wt 96.8 kg   SpO2 98%   BMI 27.77 kg/m?  , BMI Body mass index is 27.77 kg/m?. ?GEN: Well nourished, well developed, in no acute distress ?HEENT: normal ?Neck: no JVD, carotid bruits, or masses ?Cardiac: RRR; no murmurs, rubs, or gallops,no edema  ?Respiratory:  clear to auscultation bilaterally, normal work of breathing ?GI: soft, nontender, nondistended, + BS ?MS: no deformity or atrophy ?Skin: warm and dry, no rash ?Neuro:  Strength and sensation are intact ?Psych: euthymic mood, full affect ? ?Recent  Labs: ?06/22/2020: TSH 2.240 ?03/19/2021: ALT 19; BNP 312.5; Hemoglobin 17.2; Platelets 243 ?04/26/2021: BUN 11; Creatinine, Ser 1.12; Potassium 4.6; Sodium 140  ? ? ?Lipid Panel ?Lab Results  ?Component Value Date  ? CHOL 225 (H) 03/19/2021  ? HDL 61 03/19/2021  ? LDLCALC 136 (H) 03/19/2021  ? TRIG 160 (H) 03/19/2021  ? ?  ? ?Wt Readings from Last 3 Encounters:  ?05/18/21 96.8 kg  ?04/29/21 98 kg  ?04/26/21 96.6 kg  ?  ? ? ?ASSESSMENT AND PLAN: ? ?Problem List Items Addressed This Visit   ?None ?Visit Diagnoses   ? ? Dilated cardiomyopathy ([place])    -  Primary  ? Relevant Medications  ? metoprolol succinate (TOPROL XL) 25 MG 24 hr tablet  ? rosuvastatin (CRESTOR) 20 MG tablet  ? aspirin EC 81 MG tablet  ? Other Relevant Orders  ? EKG 12-Lead  ? CBC  ? Basic Metabolic Panel (BMET)  ? ?  ? ?Cardiomyopathy ?Significant concern for underlying ischemic cardiomyopathy given strong family history, hyperlipidemia, long smoking history, abnormal EKG ?Also with symptoms of shortness of breath, concern for angina ?-We have recommended cardiac catheterization to rule out ischemic heart disease ?-After discussion of his schedule, we have tentatively  scheduled this in 1 week's time ?-Recommend he start aspirin 81 daily, statin as below, metoprolol succinate 25 mg daily ?I have reviewed the risks, indications, and alternatives to cardiac catheterization, possible angioplasty, and stenting with the patient. Risks include but are not limited to bleeding, infection, vascular injury, stroke, myocardial infection, arrhythmia, kidney injury, radiation-related injury in the case of prolonged fluoroscopy use, emergency cardiac surgery, and death. The patient understands the risks of serious complication is 1-2 in 8337 with diagnostic cardiac cath and 1-2% or less with angioplasty/stenting.  ? ?-- Hyperlipidemia ?Recommend he start Crestor 20 mg daily ? ?Sinus tachycardia ?In the setting of cardiomyopathy ?Suspect secondary to low output  failure ?Metoprolol succinate 25 daily as above ?We did discuss other medications that can be used for cardiomyopathy, will wait until catheterization results available ? ? ? Total encounter time more than 50 minutes ? Greater than 50% w

## 2021-05-18 NOTE — Telephone Encounter (Signed)
-----   Message from Minna Merritts, MD sent at 05/17/2021  6:05 PM EDT ----- ?Echocardiogram ?Severely depressed ejection fraction 20 to 25% ?Moderately dilated left ventricle ?Given family history, markedly abnormal EKG, echocardiogram findings with weak left ventricle function, concern for coronary disease, blockage ?Would probably be best served with a left heart catheterization not a stress test ?Need appointment in clinic to discuss and arrange further testing ?If catheterization, would discontinue order for stress testing ?

## 2021-05-19 ENCOUNTER — Other Ambulatory Visit: Payer: Self-pay | Admitting: Cardiovascular Disease

## 2021-05-19 ENCOUNTER — Ambulatory Visit: Payer: Managed Care, Other (non HMO)

## 2021-05-19 DIAGNOSIS — G4733 Obstructive sleep apnea (adult) (pediatric): Secondary | ICD-10-CM | POA: Diagnosis not present

## 2021-05-19 DIAGNOSIS — G4719 Other hypersomnia: Secondary | ICD-10-CM

## 2021-05-19 LAB — CBC
Hematocrit: 47.4 % (ref 37.5–51.0)
Hemoglobin: 16.1 g/dL (ref 13.0–17.7)
MCH: 32.1 pg (ref 26.6–33.0)
MCHC: 34 g/dL (ref 31.5–35.7)
MCV: 95 fL (ref 79–97)
Platelets: 273 10*3/uL (ref 150–450)
RBC: 5.01 x10E6/uL (ref 4.14–5.80)
RDW: 14.2 % (ref 11.6–15.4)
WBC: 7.9 10*3/uL (ref 3.4–10.8)

## 2021-05-19 LAB — BASIC METABOLIC PANEL
BUN/Creatinine Ratio: 10 (ref 9–20)
BUN: 11 mg/dL (ref 6–24)
CO2: 15 mmol/L — ABNORMAL LOW (ref 20–29)
Calcium: 9.1 mg/dL (ref 8.7–10.2)
Chloride: 106 mmol/L (ref 96–106)
Creatinine, Ser: 1.06 mg/dL (ref 0.76–1.27)
Glucose: 102 mg/dL — ABNORMAL HIGH (ref 70–99)
Potassium: 4 mmol/L (ref 3.5–5.2)
Sodium: 141 mmol/L (ref 134–144)
eGFR: 83 mL/min/{1.73_m2} (ref >59)

## 2021-05-19 MED ORDER — SODIUM CHLORIDE 0.9% FLUSH: 3.0000 mL | Freq: Two times a day (BID) | INTRAVENOUS | Status: DC

## 2021-05-20 ENCOUNTER — Ambulatory Visit: Payer: Managed Care, Other (non HMO)

## 2021-05-25 ENCOUNTER — Encounter
Admission: RE | Disposition: A | Discharge status: Home / Self Care | Payer: Managed Care, Other (non HMO) | Source: Home / Self Care | Attending: Internal Medicine

## 2021-05-25 ENCOUNTER — Encounter: Payer: Self-pay | Admitting: Internal Medicine

## 2021-05-25 ENCOUNTER — Ambulatory Visit
Admission: RE | Admit: 2021-05-25 | Discharge: 2021-05-25 | Disposition: A | Discharge status: Home / Self Care | Payer: Managed Care, Other (non HMO) | Attending: Internal Medicine | Admitting: Internal Medicine

## 2021-05-25 ENCOUNTER — Other Ambulatory Visit: Payer: Self-pay

## 2021-05-25 DIAGNOSIS — E785 Hyperlipidemia, unspecified: Secondary | ICD-10-CM | POA: Insufficient documentation

## 2021-05-25 DIAGNOSIS — R7303 Prediabetes: Secondary | ICD-10-CM | POA: Insufficient documentation

## 2021-05-25 DIAGNOSIS — I5020 Unspecified systolic (congestive) heart failure: Secondary | ICD-10-CM | POA: Diagnosis not present

## 2021-05-25 DIAGNOSIS — Z8249 Family history of ischemic heart disease and other diseases of the circulatory system: Secondary | ICD-10-CM | POA: Diagnosis not present

## 2021-05-25 DIAGNOSIS — Z7982 Long term (current) use of aspirin: Secondary | ICD-10-CM | POA: Insufficient documentation

## 2021-05-25 DIAGNOSIS — Z79899 Other long term (current) drug therapy: Secondary | ICD-10-CM | POA: Diagnosis not present

## 2021-05-25 DIAGNOSIS — R000 Tachycardia, unspecified: Secondary | ICD-10-CM | POA: Diagnosis not present

## 2021-05-25 DIAGNOSIS — I420 Dilated cardiomyopathy: Secondary | ICD-10-CM | POA: Diagnosis present

## 2021-05-25 DIAGNOSIS — Z87891 Personal history of nicotine dependence: Secondary | ICD-10-CM | POA: Diagnosis not present

## 2021-05-25 DIAGNOSIS — I2510 Atherosclerotic heart disease of native coronary artery without angina pectoris: Secondary | ICD-10-CM | POA: Insufficient documentation

## 2021-05-25 DIAGNOSIS — R0602 Shortness of breath: Secondary | ICD-10-CM | POA: Diagnosis not present

## 2021-05-25 DIAGNOSIS — I429 Cardiomyopathy, unspecified: Secondary | ICD-10-CM

## 2021-05-25 HISTORY — PX: RIGHT/LEFT HEART CATH AND CORONARY ANGIOGRAPHY: CATH118266

## 2021-05-25 SURGERY — RIGHT/LEFT HEART CATH AND CORONARY ANGIOGRAPHY
Anesthesia: Moderate Sedation

## 2021-05-25 SURGERY — LEFT HEART CATH AND CORONARY ANGIOGRAPHY
Anesthesia: Moderate Sedation

## 2021-05-25 MED ORDER — FENTANYL CITRATE (PF) 100 MCG/2ML IJ SOLN
INTRAMUSCULAR | Status: AC
  Filled 2021-05-25: qty 2

## 2021-05-25 MED ORDER — HEPARIN SODIUM (PORCINE) 1000 UNIT/ML IJ SOLN
INTRAMUSCULAR | Status: AC
  Filled 2021-05-25: qty 10

## 2021-05-25 MED ORDER — LIDOCAINE HCL 1 % IJ SOLN
INTRAMUSCULAR | Status: AC
  Filled 2021-05-25: qty 20

## 2021-05-25 MED ORDER — FUROSEMIDE 10 MG/ML IJ SOLN
INTRAMUSCULAR | Status: AC
  Filled 2021-05-25: qty 4

## 2021-05-25 MED ORDER — SODIUM CHLORIDE 0.9 % IV SOLN: 250.0000 mL | INTRAVENOUS | Status: DC | PRN

## 2021-05-25 MED ORDER — VERAPAMIL HCL 2.5 MG/ML IV SOLN
INTRAVENOUS | Status: AC
  Filled 2021-05-25: qty 2

## 2021-05-25 MED ORDER — ACETAMINOPHEN 325 MG PO TABS: 650.0000 mg | ORAL_TABLET | ORAL | Status: DC | PRN

## 2021-05-25 MED ORDER — LOSARTAN POTASSIUM 25 MG PO TABS: 25.0000 mg | ORAL_TABLET | Freq: Every day | ORAL | 11 refills | Status: DC

## 2021-05-25 MED ORDER — FUROSEMIDE 40 MG PO TABS: 40.0000 mg | ORAL_TABLET | Freq: Every day | ORAL | 11 refills | Status: DC

## 2021-05-25 MED ORDER — MIDAZOLAM HCL 2 MG/2ML IJ SOLN
INTRAMUSCULAR | Status: AC
  Filled 2021-05-25: qty 2

## 2021-05-25 MED ORDER — METOPROLOL SUCCINATE ER 25 MG PO TB24: 12.5000 mg | ORAL_TABLET | Freq: Every day | ORAL | 3 refills | Status: DC

## 2021-05-25 MED ORDER — IOHEXOL 300 MG/ML  SOLN
INTRAMUSCULAR | Status: DC | PRN
  Administered 2021-05-25: 28 mL

## 2021-05-25 MED ORDER — SODIUM CHLORIDE 0.9% FLUSH: 3.0000 mL | INTRAVENOUS | Status: DC | PRN

## 2021-05-25 MED ORDER — SODIUM CHLORIDE 0.9 % WEIGHT BASED INFUSION: 1.0000 mL/kg/h | INTRAVENOUS | Status: DC

## 2021-05-25 MED ORDER — HEPARIN (PORCINE) IN NACL 1000-0.9 UT/500ML-% IV SOLN
INTRAVENOUS | Status: AC
  Filled 2021-05-25: qty 1000

## 2021-05-25 MED ORDER — MIDAZOLAM HCL 2 MG/2ML IJ SOLN
INTRAMUSCULAR | Status: DC | PRN
  Administered 2021-05-25: 1 mg via INTRAVENOUS

## 2021-05-25 MED ORDER — SODIUM CHLORIDE 0.9 % IV SOLN
250.0000 mL | INTRAVENOUS | Status: DC | PRN
Start: 2021-05-25 — End: 2021-05-25

## 2021-05-25 MED ORDER — ASPIRIN 81 MG PO CHEW: 81.0000 mg | CHEWABLE_TABLET | ORAL | Status: DC

## 2021-05-25 MED ORDER — FUROSEMIDE 10 MG/ML IJ SOLN
INTRAMUSCULAR | Status: DC | PRN
  Administered 2021-05-25: 20 mg via INTRAVENOUS

## 2021-05-25 MED ORDER — ONDANSETRON HCL 4 MG/2ML IJ SOLN: 4.0000 mg | Freq: Four times a day (QID) | INTRAMUSCULAR | Status: DC | PRN

## 2021-05-25 MED ORDER — HEPARIN (PORCINE) IN NACL 1000-0.9 UT/500ML-% IV SOLN
INTRAVENOUS | Status: DC | PRN
  Administered 2021-05-25: 1000 mL

## 2021-05-25 MED ORDER — HEPARIN SODIUM (PORCINE) 1000 UNIT/ML IJ SOLN
INTRAMUSCULAR | Status: DC | PRN
  Administered 2021-05-25: 5000 [IU] via INTRAVENOUS

## 2021-05-25 MED ORDER — FENTANYL CITRATE (PF) 100 MCG/2ML IJ SOLN
INTRAMUSCULAR | Status: DC | PRN
  Administered 2021-05-25: 25 ug via INTRAVENOUS

## 2021-05-25 MED ORDER — SODIUM CHLORIDE 0.9% FLUSH: 3.0000 mL | Freq: Two times a day (BID) | INTRAVENOUS | Status: DC

## 2021-05-25 MED ORDER — SODIUM CHLORIDE 0.9 % WEIGHT BASED INFUSION
3.0000 mL/kg/h | INTRAVENOUS | Status: AC
  Administered 2021-05-25: 3 mL/kg/h via INTRAVENOUS

## 2021-05-25 MED ORDER — VERAPAMIL HCL 2.5 MG/ML IV SOLN
INTRAVENOUS | Status: DC | PRN
  Administered 2021-05-25 (×2): 2.5 mg via INTRA_ARTERIAL

## 2021-05-25 MED ORDER — HYDRALAZINE HCL 20 MG/ML IJ SOLN: 10.0000 mg | INTRAMUSCULAR | Status: DC | PRN

## 2021-05-25 SURGICAL SUPPLY — 15 items
CATH 5F 110X4 TIG (CATHETERS) ×2 IMPLANT
CATH BALLN WEDGE 5F 110CM (CATHETERS) ×2 IMPLANT
CATH INFINITI 5FR ANG PIGTAIL (CATHETERS) ×2 IMPLANT
DEVICE RAD TR BAND REGULAR (VASCULAR PRODUCTS) ×2 IMPLANT
DRAPE BRACHIAL (DRAPES) ×4 IMPLANT
GLIDESHEATH SLEND A-KIT 6F 22G (SHEATH) ×2 IMPLANT
GLIDESHEATH SLEND SS 6F .021 (SHEATH) ×2 IMPLANT
GUIDEWIRE INQWIRE 1.5J.035X260 (WIRE) ×1 IMPLANT
INQWIRE 1.5J .035X260CM (WIRE) ×3
PACK CARDIAC CATH (CUSTOM PROCEDURE TRAY) ×2 IMPLANT
PAD ELECT DEFIB RADIOL ZOLL (MISCELLANEOUS) ×2 IMPLANT
PROTECTION STATION PRESSURIZED (MISCELLANEOUS) ×3
SET ATX SIMPLICITY (MISCELLANEOUS) ×2 IMPLANT
SHEATH GLIDE SLENDER 4/5FR (SHEATH) ×2 IMPLANT
STATION PROTECTION PRESSURIZED (MISCELLANEOUS) ×1 IMPLANT

## 2021-05-25 NOTE — Brief Op Note (Signed)
BRIEF CARDIAC CATHETERIZATION NOTE ? ?DATE: 05/25/2021 ? ?TIME: 12:36 PM ? ?PATIENT:  Willie Hess  56 y.o. male ? ?PRE-OPERATIVE DIAGNOSIS:  LT Cath   Cardiomyopathy ? ?POST-OPERATIVE DIAGNOSIS:  * No post-op diagnosis entered * ? ?PROCEDURE:  Procedure(s): ?LEFT HEART CATH AND CORONARY ANGIOGRAPHY (Left) ? ?SURGEON:  Surgeon(s) and Role: ?   Nelva Bush, MD - Primary ? ?FINDINGS: ?Mild, non-obstructive CAD.  Findings consistent with non-ischemic cardiomyopathy. ?Moderately elevated right heart and pulmonary artery pressures. ?Severely elevated left heart pressure. ?Mild-moderately reduced Fick cardiac output/index. ? ?RECOMMENDATIONS: ?Administer furosemide 20 mg IV x 1 at [place] of the procedure, followed by 40 mg PO daily. ?Start metoprolol succinate 12.5 mg daily and losartan 25 mg daily. ?Primary prevention of coronary artery disease. ? ?Nelva Bush, MD ?[facility] ? ?

## 2021-05-25 NOTE — Interval H&P Note (Signed)
History and Physical Interval Note: ? ?05/25/2021 ?11:50 AM ? ?Willie Hess  has presented today for surgery, with the diagnosis of cardiomyopathy and HFrEF.  The various methods of treatment have been discussed with the patient and family. After consideration of risks, benefits and other options for treatment, the patient has consented to  Procedure(s): ?LEFT HEART CATH AND CORONARY ANGIOGRAPHY (Left) as a surgical intervention.  Due to concern for low output heart failure, we will also perform a RHC.  The patient's history has been reviewed, patient examined, no change in status, stable for surgery.  I have reviewed the patient's chart and labs.  Questions were answered to the patient's satisfaction.   ? ?Cath Lab Visit (complete for each Cath Lab visit) ? ?Clinical Evaluation Leading to the Procedure:  ? ?ACS: No. ? ?Non-ACS: ? ?Anginal/Heart Failure Classification: CCS class III ? ?Anti-ischemic medical therapy: Minimal Therapy (1 class of medications) ? ?Non-Invasive Test Results: No non-invasive testing performed (LVEF 20-25% by echo -> high risk) ? ?Prior CABG: No previous CABG ? ?Willie Hess ? ? ?

## 2021-05-26 ENCOUNTER — Encounter: Payer: Self-pay | Admitting: Internal Medicine

## 2021-05-31 DIAGNOSIS — G4733 Obstructive sleep apnea (adult) (pediatric): Secondary | ICD-10-CM | POA: Diagnosis not present

## 2021-06-15 ENCOUNTER — Encounter: Payer: Self-pay | Admitting: Adult Health

## 2021-06-15 ENCOUNTER — Ambulatory Visit: Payer: Managed Care, Other (non HMO) | Admitting: Adult Health

## 2021-06-15 VITALS — BP 110/64 | HR 100 | Temp 97.8°F | Ht 74.0 in | Wt 212.0 lb

## 2021-06-15 DIAGNOSIS — G4733 Obstructive sleep apnea (adult) (pediatric): Secondary | ICD-10-CM

## 2021-06-15 DIAGNOSIS — I5020 Unspecified systolic (congestive) heart failure: Secondary | ICD-10-CM

## 2021-06-15 NOTE — Patient Instructions (Signed)
Begin CPAP At bedtime   ?Wear all night long  ?May try dream wear nasal or full face.  ?Work on healthy weight  ?Do not drive if sleepy  ?Follow up in 2-3 months and As needed   ? ?

## 2021-06-15 NOTE — Progress Notes (Signed)
Reviewed and agree with assessment/plan. ? ? ?Chesley Mires, MD ?[place] ?06/15/2021, 10:17 AM ?Pager:  [phone] ? ?

## 2021-06-15 NOTE — Assessment & Plan Note (Signed)
Recent nonischemic cardiomyopathy diagnosis.  Patient is following with cardiology.  Is on primary prevention regimen.  No signs of volume overload on exam.  Appears euvolemic.  As above patient will begin CPAP for underlying severe obstructive sleep apnea.  Continue follow-up with cardiology ?

## 2021-06-15 NOTE — Progress Notes (Signed)
? ?_0  ID: Willie Hess, male    DOB: 14-Oct-1965, 56 y.o.   MRN: [id number] ? ?Chief Complaint  ?Patient presents with  ? Follow-up  ? ? ?Referring provider: ?Bo Merino, FNP ? ?HPI: ?56 year old male seen for sleep consult April 29, 2021 with restless sleep, daytime sleepiness and snoring found to have severe obstructive sleep apnea ? ? ?TEST/EVENTS :  ?Home sleep study done May 19, 2021 AHI 57.8/hour SPO2 low at 82% ? ?06/15/2021 Follow up : OSA  ?Patient presents for a 47-monthfollow-up.  Patient was seen last visit for sleep consult.  Patient was complaining of restless sleep, daytime sleepiness and snoring.  Patient was set up for home sleep study that was completed on May 19, 2021 that showed very severe sleep apnea with an AHI of 57.8/hour and SPO2 low at 82%.  We discussed his sleep study results went over patient education on sleep apnea and potential cardiovascular complications.  Went over treatment options including weight loss.  Discussed CPAP therapy.  Patient is in agreement to start CPAP. ? ?Since last visit patient was admitted last month with congestive heart failure and cardiomyopathy.  Echo on May 13, 2021 showed EF at 20 to 25%, global hypokinesis, grade 1 diastolic dysfunction.  He underwent cardiac cath on April 11 that showed mild nonobstructive coronary artery disease consistent with nonischemic cardiomyopathy and moderately elevated right heart/pulmonary artery pressures.  And severely elevated left heart pressure.  Patient is following with cardiology and is on a maintenance regimen with Toprol, Cozaar and Lasix. ? ?Patient is a former smoker quit smoking in 2020.  Has a 32-pack-year history.  He participates in the lung cancer screening program.  Last CT chest was April 15, 2021-lung RADS 2 with benign appearance, positive emphysema, stable small solid pulmonary nodules maximum measuring 5.9 mm in right upper lobe. ? ? ? ?Allergies  ?Allergen Reactions  ? Bee Venom Swelling   ? ? ?Immunization History  ?Administered Date(s) Administered  ? Influenza,inj,Quad PF,6+ Mos 12/11/2014, 10/27/2015, 10/11/2016, 10/07/2019  ? Tdap 10/11/2016  ? ? ?Past Medical History:  ?Diagnosis Date  ? Arthritis   ? Benign neoplasm of ascending colon   ? Benign neoplasm of cecum   ? Benign neoplasm of sigmoid colon   ? Benign neoplasm of transverse colon   ? Decreased creatinine clearance 12/11/2014  ? 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 GERD (gastroesophageal reflux disease)   ? GERD without esophagitis 12/11/2014  ? Headache   ? migraines 1x/6 mos (lesser HAs more often)  ? History of kidney stones   ? h/o  ? Hx of smoking 07/27/2017  ? Lyme disease   ? Prediabetes 09/25/2017  ? Preventative health care 10/12/2015  ? ? ?Tobacco History: ?Social History  ? ?Tobacco Use  ?Smoking Status Former  ? Packs/day: 1.00  ? Years: 32.00  ? Pack years: 32.00  ? Types: Cigarettes  ? Quit date: 2020  ? Years since quitting: 3.3  ?Smokeless Tobacco Never  ? ?Counseling given: Not Answered ? ? ?Outpatient Medications Prior to Visit  ?Medication Sig Dispense Refill  ? blood glucose meter kit and supplies Dispense based on patient and insurance preference. Use up to four times daily as directed. (FOR ICD-10 E10.9, E11.9). 1 each 0  ? furosemide (LASIX) 40 MG tablet Take 1 tablet (40 mg total) by mouth daily. 30 tablet 11  ? losartan (COZAAR) 25 MG tablet Take 1 tablet (25 mg total) by mouth daily. 30 tablet 11  ?  metoprolol succinate (TOPROL XL) 25 MG 24 hr tablet Take 0.5 tablets (12.5 mg total) by mouth daily. 90 tablet 3  ? Omeprazole-Sodium Bicarbonate (ZEGERID) 20-1100 MG CAPS capsule Take 2 capsules by mouth daily.     ? Blood Glucose Monitoring Suppl (ACCU-CHEK GUIDE ME) w/Device KIT 1 Device by Does not apply route once for 1 dose. 1 kit 0  ? ?Facility-Administered Medications Prior to Visit  ?Medication Dose Route Frequency Provider Last Rate Last Admin  ? sodium chloride flush (NS) 0.9 % injection 3 mL  3 mL Intravenous Q12H  Gollan, Kathlene November, MD      ? ? ? ?Review of Systems:  ? ?Constitutional:   No  weight loss, night sweats,  Fevers, chills,  ?+fatigue, or  lassitude. ? ?HEENT:   No headaches,  Difficulty swallowing,  Tooth/dental problems, or  Sore throat,  ?              No sneezing, itching, ear ache, nasal congestion, post nasal drip,  ? ?CV:  No chest pain,  Orthopnea, PND, swelling in lower extremities, anasarca, dizziness, palpitations, syncope.  ? ?GI  No heartburn, indigestion, abdominal pain, nausea, vomiting, diarrhea, change in bowel habits, loss of appetite, bloody stools.  ? ?Resp: No shortness of breath with exertion or at rest.  No excess mucus, no productive cough,  No non-productive cough,  No coughing up of blood.  No change in color of mucus.  No wheezing.  No chest wall deformity ? ?Skin: no rash or lesions. ? ?GU: no dysuria, change in color of urine, no urgency or frequency.  No flank pain, no hematuria  ? ?MS:  No joint pain or swelling.  No decreased range of motion.  No back pain. ? ? ? ?Physical Exam ? ?BP 110/64 (BP Location: Left Arm, Cuff Size: Normal)   Pulse 100   Temp 97.8 ?F (36.6 ?C) (Temporal)   Ht _0  (1.88 m)   Wt 212 lb (96.2 kg) Comment: 212.6lbs  SpO2 97%   BMI 27.22 kg/m?  ? ?GEN: A/Ox3; pleasant , NAD, well nourished  ?  ?HEENT:  [place]/AT,  , NOSE-clear, THROAT-clear, no lesions, no postnasal drip or exudate noted.  ?Class 2 MP airway  ? ?NECK:  Supple w/ fair ROM; no JVD; normal carotid impulses w/o bruits; no thyromegaly or nodules palpated; no lymphadenopathy.   ? ?RESP  Clear  P & A; w/o, wheezes/ rales/ or rhonchi. no accessory muscle use, no dullness to percussion ? ?CARD:  RRR, no m/r/g, no peripheral edema, pulses intact, no cyanosis or clubbing. ? ?GI:   Soft & nt; nml bowel sounds; no organomegaly or masses detected.  ? ?Musco: Warm bil, no deformities or joint swelling noted.  ? ?Neuro: alert, no focal deficits noted.   ? ?Skin: Warm, no lesions or rashes ? ? ? ?Lab  Results: ? ? ? ? ?ProBNP ?No results found for: PROBNP ? ?Imaging: ?CARDIAC CATHETERIZATION ? ?Result Date: 05/25/2021 ?  Mid LAD lesion is 20% stenosed.   LV end diastolic pressure is severely elevated.   There is no aortic valve stenosis. Conclusions: Mild, non-obstructive coronary artery disease with 20% mid LAD stenosis.  No significant disease noted in the LCx or RCA.  Findings are consistent with non-ischemic cardiomyopathy. Severely elevated left heart filling pressures. Moderately elevated right heart and pulmonary artery pressures. Mildly-moderately reduced Fick cardiac output/index. Recommendations: Initiate diuresis and goal directed medical therapy with metoprolol succinate 12.5 mg daily and losartan 25 mg  daily.  Further optimization of GDMT per Dr. Rockey Situ at follow-up. Primary prevention of coronary artery disease. Nelva Bush, MD [facility]  ? ? ? ?   ? View : No data to display.  ?  ?  ?  ? ? ?No results found for: NITRICOXIDE ? ? ? ? ? ?Assessment & Plan:  ? ?OSA (obstructive sleep apnea) ?Very severe obstructive sleep apnea.  Patient education on sleep apnea and treatment with CPAP.  Patient will begin a CPAP AutoSet 5 to 15 cm H2O.  Mask of choice.  Patient would like to try either the DreamWear nasal mask or full facemask. ? ?- discussed how weight can impact sleep and risk for sleep disordered breathing ?- discussed options to assist with weight loss: combination of diet modification, cardiovascular and strength training exercises ?  ?- had an extensive discussion regarding the adverse health consequences related to untreated sleep disordered breathing ?- specifically discussed the risks for hypertension, coronary artery disease, cardiac dysrhythmias, cerebrovascular disease, and diabetes ?- lifestyle modification discussed ?  ?- discussed how sleep disruption can increase risk of accidents, particularly when driving ?- safe driving practices were discussed ?  ?Plan  ?Patient Instructions   ?Begin CPAP At bedtime   ?Wear all night long  ?May try dream wear nasal or full face.  ?Work on healthy weight  ?Do not drive if sleepy  ?Follow up in 2-3 months and As needed   ? ?  ? ? ?HFrEF (heart failure with re

## 2021-06-15 NOTE — Assessment & Plan Note (Signed)
Very severe obstructive sleep apnea.  Patient education on sleep apnea and treatment with CPAP.  Patient will begin a CPAP AutoSet 5 to 15 cm H2O.  Mask of choice.  Patient would like to try either the DreamWear nasal mask or full facemask. ? ?- discussed how weight can impact sleep and risk for sleep disordered breathing ?- discussed options to assist with weight loss: combination of diet modification, cardiovascular and strength training exercises ?  ?- had an extensive discussion regarding the adverse health consequences related to untreated sleep disordered breathing ?- specifically discussed the risks for hypertension, coronary artery disease, cardiac dysrhythmias, cerebrovascular disease, and diabetes ?- lifestyle modification discussed ?  ?- discussed how sleep disruption can increase risk of accidents, particularly when driving ?- safe driving practices were discussed ?  ?Plan  ?Patient Instructions  ?Begin CPAP At bedtime   ?Wear all night long  ?May try dream wear nasal or full face.  ?Work on healthy weight  ?Do not drive if sleepy  ?Follow up in 2-3 months and As needed   ? ?  ? ?

## 2021-06-21 NOTE — Progress Notes (Signed)
Cardiology Office Note ? ?Date:  06/22/2021  ? ?ID:  Willie Hess, DOB 10/14/1965, MRN [id number] ? ?PCP:  Willie Hess, Julie F, FNP  ? ?Chief Complaint  ?Patient presents with  ? Follow up Cardiac Cath   ?  "Doing well." Medications reviewed by the patient verbally.   ? ? ?HPI:  ?[name] is a 55-year-old gentleman with past medical history of ?Long history of smoking, quit 1 yr ago ?Hyperlipidemia, untreated ?Prediabetes ?OSA about to start CPAP ?Presenting for Hess/u of his abnormal EKG,  strong family history coronary disease, congestive heart failure,cardiomyopathy ejection fraction 20 to 25%, shortness of breath ? ?Last seen by myself in clinic April 2023 ?Echocardiogram May 13, 2021 with depressed ejection fraction 20 to 25%' ? ?Went for cardiac catheterization May 25, 2021 showing nonobstructive coronary disease 20% mid LAD, severely elevated left heart filling pressures, moderately elevated right heart and pulmonary artery pressures ?Was started on metoprolol succinate 12.5 daily and losartan 25 daily post-catheterization ? ?On lasix daily, ?May have slight abd swelling ?High fluid intake, some sodas ? ?Gets CPAP this week ? ?Denies significant leg swelling, no PND orthopnea ?Weight initially dropped on Lasix, has started to trend back upwards ? ?Does not have blood pressure cuff at home ? ?EKG personally reviewed by myself on todays visit ?Sinus tachycardia rate 101 bpm nonspecific T wave abnormality precordial leads, 1 and aVL ? ?Other past medical history reviewed ?Strong family history of cardiac disease, most of his relatives ? ?Echo 05/13/21  ? 1. Left ventricular ejection fraction, by estimation, is 20 to 25%. The  ?left ventricle has severely decreased function. The left ventricle  ?demonstrates global hypokinesis. The left ventricular internal cavity size  ?was moderately dilated. Left  ?ventricular diastolic parameters are consistent with Grade I diastolic  ?dysfunction (impaired relaxation).  ?   Left atrial size was moderately dilated.  ? ?CT chest March 2023 reviewed ?Emphysema, aortic atherosclerosis ? ?Lab work reviewed ?A1c 5.8 ?Total cholesterol 225 ? ?Lab Results  ?Component Value Date  ? CHOL 225 (H) 03/19/2021  ? HDL 61 03/19/2021  ? LDLCALC 136 (H) 03/19/2021  ? TRIG 160 (H) 03/19/2021  ?  ?Father with CAD, CHF, lived to 72 ? 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 ? ?PSH:    ?Past Surgical History:  ?Procedure Laterality Date  ? COLONOSCOPY WITH PROPOFOL N/A 11/12/2015  ? Procedure: COLONOSCOPY WITH PROPOFOL;  Surgeon: Willie Wohl, Hess;  Location: [facility];  Service: Endoscopy;  Laterality: N/A;  ? COLONOSCOPY WITH PROPOFOL N/A 04/20/2021  ? Procedure: COLONOSCOPY WITH PROPOFOL;  Surgeon: Willie Hess;  Location: [facility];  Service: Endoscopy;  Laterality: N/A;  ? ESOPHAGOGASTRODUODENOSCOPY    ? INSERTION OF MESH Left 08/24/2017  ? Procedure: INSERTION OF MESH;  Surgeon: Willie Hess;  Location: [facility];  Service: General;  Laterality: Left;  ? POLYPECTOMY  11/12/2015  ? Procedure: POLYPECTOMY;  Surgeon: Willie Wohl, Hess;  Location: [facility];  Service: Endoscopy;;  ? RIGHT/LEFT HEART CATH AND CORONARY ANGIOGRAPHY  05/25/2021  ? Procedure: RIGHT/LEFT HEART CATH AND CORONARY ANGIOGRAPHY;  Surgeon: Willie Hess;  Location: [facility];  Service:   Cardiovascular;;  ? ROBOT ASSISTED INGUINAL HERNIA REPAIR Left 08/24/2017  ? Procedure: ROBOT ASSISTED INGUINAL HERNIA REPAIR;  Surgeon: Willie Husbands, Hess;  Location: [facility];  Service:  General;  Laterality: Left;  ? URETEROSCOPY WITH HOLMIUM LASER LITHOTRIPSY Left 09/23/2014  ? Procedure: URETEROSCOPY, CYSTOSCOPY WITH STENT PLACEMENT, HOLMIUM STAND BY ;  Surgeon: Willie Willie Hess;  Location: [facility];  Service: Urology;  Laterality: Left;  ? VASECTOMY    ? WISDOM TOOTH EXTRACTION    ? ? ?Current Outpatient Medications  ?Medication Sig Dispense Refill  ? blood glucose meter kit and supplies Dispense based on patient and insurance preference. Use up to four times daily as directed. (FOR ICD-10 E10.9, E11.9). 1 each 0  ? Blood Glucose Monitoring Suppl (ACCU-CHEK GUIDE ME) w/Device KIT 1 Device by Does not apply route once for 1 dose. 1 kit 0  ? furosemide (LASIX) 40 MG tablet Take 1 tablet (40 mg total) by mouth daily. 30 tablet 11  ? losartan (COZAAR) 25 MG tablet Take 1 tablet (25 mg total) by mouth daily. 30 tablet 11  ? metoprolol succinate (TOPROL XL) 25 MG 24 hr tablet Take 0.5 tablets (12.5 mg total) by mouth daily. 90 tablet 3  ? Omeprazole-Sodium Bicarbonate (ZEGERID) 20-1100 MG CAPS capsule Take 2 capsules by mouth daily.     ? ?No current facility-administered medications for this visit.  ? ?Facility-Administered Medications Ordered in Other Visits  ?Medication Dose Route Frequency Provider Last Rate Last Admin  ? sodium chloride flush (NS) 0.9 % injection 3 mL  3 mL Intravenous Q12H Willie Hess, Willie November, Hess      ? ? ? ?Allergies:   Bee venom  ? ?Social History:  The patient  reports that he quit smoking about 3 years ago. His smoking use included cigarettes. He has a 32.00 pack-year smoking history. He has never used smokeless tobacco. He reports that he does not drink alcohol and does not use drugs.  ? ?Family History:   family history includes Emphysema in his paternal grandfather; Heart disease in his father, paternal grandmother, and sister; Other in his brother; Thyroid disease in his mother.  ? ? ?Review of Systems: ?Review of Systems  ?Constitutional:  Positive for malaise/fatigue.   ?HENT: Negative.    ?Respiratory:  Positive for shortness of breath.   ?Cardiovascular: Negative.   ?Gastrointestinal: Negative.   ?Musculoskeletal: Negative.   ?Neurological: Negative.   ?Psychiatric/Behavioral: Negative.    ?All other systems reviewed and are negative. ? ? ?PHYSICAL EXAM: ?VS:  BP 110/72 (BP Location: Left Arm, Patient Position: Sitting, Cuff Size: Normal)   Pulse (!) 101   Ht 6' 2" (1.88 m)   Wt 214 lb 4 oz (97.2 kg)   SpO2 98%   BMI 27.51 kg/m?  , BMI Body mass index is 27.51 kg/m?Marland Kitchen ?Constitutional:  oriented to person, place, and time. No distress.  ?HENT:  ?Head: Grossly normal ?Eyes:  no discharge. No scleral icterus.  ?Neck: No JVD, no carotid bruits  ?Cardiovascular: Regular rate and rhythm, no murmurs appreciated ?Pulmonary/Chest: Clear to auscultation bilaterally, no wheezes or rails ?Abdominal: Soft.  no distension.  no tenderness.  ?Musculoskeletal: Normal range of motion ?Neurological:  normal muscle tone. Coordination normal. No atrophy ?Skin: Skin warm and dry ?Psychiatric: normal affect, pleasant ? ?Recent Labs: ?03/19/2021: ALT 19; BNP 312.5 ?05/18/2021: BUN 11; Creatinine, Ser 1.06; Hemoglobin 16.1; Platelets 273; Potassium 4.0; Sodium 141  ? ? ?Lipid Panel ?Lab Results  ?Component Value Date  ?  CHOL 225 (H) 03/19/2021  ? HDL 61 03/19/2021  ? LDLCALC 136 (H) 03/19/2021  ? TRIG 160 (H) 03/19/2021  ? ?  ? ?Wt Readings from Last 3 Encounters:  ?06/22/21 214 lb 4 oz (97.2 kg)  ?06/15/21 212 lb (96.2 kg)  ?05/25/21 (!) 462 lb 15.5 oz (210 kg)  ?  ? ? ?ASSESSMENT AND PLAN: ? ?Problem List Items Addressed This Visit   ? ?  ? Cardiology Problems  ? Hyperlipidemia  ? ?Other Visit Diagnoses   ? ? Dilated cardiomyopathy (HCC)    -  Primary  ? Relevant Orders  ? EKG 12-Lead  ? Chest pain, unspecified type      ? Cardiomyopathy, unspecified type (HCC)      ? Relevant Orders  ? EKG 12-Lead  ? Aortic atherosclerosis (HCC)      ? Relevant Orders  ? EKG 12-Lead  ? ?   ?Cardiomyopathy ?Nonobstructive coronary disease on catheterization ?We have recommended he hold the losartan and start Entresto 24/26 mg twice daily ?Stay on metoprolol succinate ?In follow-up consider adding spironolactone 25 mg daily ve

## 2021-06-22 ENCOUNTER — Ambulatory Visit: Payer: Managed Care, Other (non HMO) | Admitting: Cardiovascular Disease

## 2021-06-22 ENCOUNTER — Encounter: Payer: Self-pay | Admitting: Cardiovascular Disease

## 2021-06-22 VITALS — BP 110/72 | HR 101 | Ht 74.0 in | Wt 214.2 lb

## 2021-06-22 DIAGNOSIS — I429 Cardiomyopathy, unspecified: Secondary | ICD-10-CM | POA: Diagnosis not present

## 2021-06-22 DIAGNOSIS — R079 Chest pain, unspecified: Secondary | ICD-10-CM | POA: Diagnosis not present

## 2021-06-22 DIAGNOSIS — E782 Mixed hyperlipidemia: Secondary | ICD-10-CM

## 2021-06-22 DIAGNOSIS — I420 Dilated cardiomyopathy: Secondary | ICD-10-CM

## 2021-06-22 DIAGNOSIS — I700 Atherosclerosis of aorta: Secondary | ICD-10-CM

## 2021-06-22 MED ORDER — ENTRESTO 24-26 MG PO TABS
1.0000 | ORAL_TABLET | Freq: Two times a day (BID) | ORAL | 3 refills | Status: DC
Start: 2021-06-22 — End: 2021-06-22

## 2021-06-22 MED ORDER — ENTRESTO 24-26 MG PO TABS
1.0000 | ORAL_TABLET | Freq: Two times a day (BID) | ORAL | 3 refills | Status: DC
Start: 2021-06-22 — End: 2022-08-22

## 2021-06-22 NOTE — Patient Instructions (Addendum)
Medication Instructions:  ?Please stop the losartan, ?Change to entresto 24/26 twice a day ? ? ?If you need a refill on your cardiac medications before your next appointment, please call your pharmacy.  ? ?Lab work: ?BMP in one month ? ? ?Testing/Procedures: ?No new testing needed ? ?Follow-Up: ?At [facility], you and your health needs are our priority.  As part of our continuing mission to provide you with exceptional heart care, we have created designated Provider Care Teams.  These Care Teams include your primary Cardiologist (physician) and Advanced Practice Providers (APPs -  Physician Assistants and Nurse Practitioners) who all work together to provide you with the care you need, when you need it. ? ?You will need a follow up appointment in 2  months, gollan or APP ? ?Providers on your designated Care Team:   ?Murray Hodgkins, NP ?Christell Faith, PA-C ?Cadence Kathlen Mody, PA-C ? ?COVID-19 Vaccine Information can be found at: [email or web address].uk For questions related to vaccine distribution or appointments, please email [email or web address] or call [phone].  ? ?

## 2021-08-17 NOTE — Progress Notes (Unsigned)
Cardiology Office Note  Date:  08/18/2021   ID:  Willie Hess, DOB September 25, 1965, MRN [id number]  PCP:  Bo Merino, FNP   Chief Complaint  Patient presents with   2 month follow up     Patient c/o some shortness of breath with over exertion, some LE edema and abdominal swelling. Medications reviewed by the patient verbally.     HPI:  Willie Hess is a 56 year old gentleman with past medical history of Long history of smoking, quit >1 yr ago Hyperlipidemia, untreated Prediabetes OSA ,CPAP Presenting for f/u of his dilated cardiomyopathy, congestive heart failure,cardiomyopathy ejection fraction 20 to 25%, shortness of breath  Last seen by myself in clinic May 2023 Echocardiogram May 13, 2021 with depressed ejection fraction 20 to 25%  Weight higher, concerned for fluid ABD feels tight, rare ankle swelling Some dietary indiscretion but trying to avoid salt  Has been taking Lasix 40 twice daily metoprolol succinate 25 daily Entresto 24/26 twice daily Weight up 3 pounds from last clinic visit, feels that it is in his abdomen, frustrated  Denies alcohol in excess  EKG personally reviewed by myself on todays visit Normal sinus rhythm rate 100 bpm nonspecific T wave abnormality V4 through V6 1 and aVL  Other past medical history reviewed cardiac catheterization May 25, 2021 showing nonobstructive coronary disease 20% mid LAD, severely elevated left heart filling pressures, moderately elevated right heart and pulmonary artery pressures Was started on metoprolol succinate 12.5 daily and losartan 25 daily post-catheterization  Strong family history of cardiac disease, most of his relatives  Echo 05/13/21   1. Left ventricular ejection fraction, by estimation, is 20 to 25%. The  left ventricle has severely decreased function. The left ventricle  demonstrates global hypokinesis. The left ventricular internal cavity size  was moderately dilated. Left  ventricular diastolic  parameters are consistent with Grade I diastolic  dysfunction (impaired relaxation).    Left atrial size was moderately dilated.   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  PSH:    Past Surgical History:  Procedure Laterality Date   COLONOSCOPY WITH PROPOFOL N/A 11/12/2015   Procedure: COLONOSCOPY WITH PROPOFOL;  Surgeon: Lucilla Lame, MD;  Location: [facility];  Service: Endoscopy;  Laterality: N/A;   COLONOSCOPY WITH PROPOFOL N/A 04/20/2021   Procedure: COLONOSCOPY WITH PROPOFOL;  Surgeon: Lucilla Lame, MD;  Location: [facility];  Service: Endoscopy;  Laterality: N/A;   ESOPHAGOGASTRODUODENOSCOPY     INSERTION OF MESH Left 08/24/2017   Procedure: INSERTION OF MESH;  Surgeon: Jules Husbands, MD;  Location: [facility];  Service: General;  Laterality: Left;   POLYPECTOMY  11/12/2015   Procedure: POLYPECTOMY;  Surgeon: Lucilla Lame, MD;  Location: [place];  Service: Endoscopy;;   RIGHT/LEFT HEART CATH AND CORONARY ANGIOGRAPHY  05/25/2021   Procedure: RIGHT/LEFT HEART CATH AND CORONARY  ANGIOGRAPHY;  Surgeon: Nelva Bush, MD;  Location: [place];  Service: Cardiovascular;;   ROBOT ASSISTED INGUINAL HERNIA REPAIR Left 08/24/2017   Procedure: ROBOT ASSISTED  INGUINAL HERNIA REPAIR;  Surgeon: Jules Husbands, MD;  Location: [facility];  Service: General;  Laterality: Left;   URETEROSCOPY WITH HOLMIUM LASER LITHOTRIPSY Left 09/23/2014   Procedure: URETEROSCOPY, CYSTOSCOPY WITH STENT PLACEMENT, HOLMIUM STAND BY ;  Surgeon: Royston Cowper, MD;  Location: [facility];  Service: Urology;  Laterality: Left;   VASECTOMY     WISDOM TOOTH EXTRACTION      Current Outpatient Medications  Medication Sig Dispense Refill   furosemide (LASIX) 40 MG tablet Take 40 mg by mouth 2 (two) times daily.     metoprolol succinate (TOPROL XL) 25 MG 24 hr tablet Take 0.5 tablets (12.5 mg total) by mouth daily. 90 tablet 3   Omeprazole-Sodium Bicarbonate (ZEGERID) 20-1100 MG CAPS capsule Take 2 capsules by mouth daily.      sacubitril-valsartan (ENTRESTO) 24-26 MG Take 1 tablet by mouth 2 (two) times daily. 180 tablet 3   spironolactone (ALDACTONE) 25 MG tablet Take 1 tablet (25 mg total) by mouth daily. 30 tablet 3   blood glucose meter kit and supplies Dispense based on patient and insurance preference. Use up to four times daily as directed. (FOR ICD-10 E10.9, E11.9). (Patient not taking: Reported on 08/18/2021) 1 each 0   Blood Glucose Monitoring Suppl (ACCU-CHEK GUIDE ME) w/Device KIT 1 Device by Does not apply route once for 1 dose. (Patient not taking: Reported on 08/18/2021) 1 kit 0   empagliflozin (JARDIANCE) 10 MG TABS tablet Take 1 tablet (10 mg total) by mouth daily before breakfast. 90 tablet 0   No current facility-administered medications for this visit.   Facility-Administered Medications Ordered in Other Visits  Medication Dose Route Frequency Provider Last Rate Last Admin   sodium chloride flush (NS) 0.9 % injection 3 mL  3 mL Intravenous Q12H Tienna Bienkowski, Kathlene November, MD         Allergies:   Bee venom   Social History:  The patient  reports that he quit smoking about 3 years ago. His smoking use included cigarettes. He has a 32.00 pack-year smoking history. He has never  used smokeless tobacco. He reports that he does not drink alcohol and does not use drugs.   Family History:   family history includes Emphysema in his paternal grandfather; Heart disease in his father, paternal grandmother, and sister; Other in his brother; Thyroid disease in his mother.    Review of Systems: Review of Systems  Constitutional:  Positive for malaise/fatigue.  HENT: Negative.    Respiratory:  Positive for shortness of breath.   Cardiovascular: Negative.   Gastrointestinal: Negative.   Musculoskeletal: Negative.   Neurological: Negative.   Psychiatric/Behavioral: Negative.    All other systems reviewed and are negative.    PHYSICAL EXAM: VS:  BP 120/80 (BP Location: Left Arm, Patient Position: Sitting, Cuff Size: Normal)   Pulse 100   Ht $R'6\' 2"'JW$  (1.88 m)   Wt 217 lb 8 oz (98.7 kg)   SpO2 99%   BMI 27.93 kg/m  , BMI Body mass index is 27.93 kg/m. Constitutional:  oriented to person, place, and time. No distress.  HENT:  Head: Grossly normal Eyes:  no discharge. No scleral icterus.  Neck: No JVD, no carotid bruits  Cardiovascular: Regular rate and rhythm, no murmurs appreciated Pulmonary/Chest: Clear to auscultation bilaterally, no wheezes or rails Abdominal: Soft.  no distension.  no tenderness.  Musculoskeletal: Normal range of motion Neurological:  normal muscle tone. Coordination normal. No atrophy Skin: Skin warm and dry Psychiatric: normal affect, pleasant  Recent Labs: 03/19/2021: ALT 19; BNP 312.5 05/18/2021: BUN 11; Creatinine, Ser 1.06; Hemoglobin 16.1; Platelets 273; Potassium 4.0; Sodium 141    Lipid Panel Lab Results  Component Value Date   CHOL 225 (H) 03/19/2021   HDL 61 03/19/2021   LDLCALC 136 (H) 03/19/2021   TRIG 160 (H) 03/19/2021      Wt Readings from Last 3 Encounters:  08/18/21 217 lb 8 oz (98.7 kg)  06/22/21 214 lb 4 oz (97.2 kg)  06/15/21 212 lb (96.2 kg)     ASSESSMENT AND PLAN:  Problem List Items Addressed This Visit        Cardiology Problems   Hyperlipidemia   Relevant Medications   furosemide (LASIX) 40 MG tablet   spironolactone (ALDACTONE) 25 MG tablet   Other Visit Diagnoses     Dilated cardiomyopathy (HCC)    -  Primary   Relevant Medications   furosemide (LASIX) 40 MG tablet   spironolactone (ALDACTONE) 25 MG tablet   Other Relevant Orders   EKG 12-Lead   Comp Met (CMET)   B Nat Peptide   Comp Met (CMET)   B Nat Peptide   Aortic atherosclerosis (HCC)       Relevant Medications   furosemide (LASIX) 40 MG tablet   spironolactone (ALDACTONE) 25 MG tablet   Chest pain, unspecified type       NICM (nonischemic cardiomyopathy) (HCC)       Relevant Medications   furosemide (LASIX) 40 MG tablet   spironolactone (ALDACTONE) 25 MG tablet     Cardiomyopathy Nonobstructive coronary disease on catheterization, denies alcohol Stressed importance of staying on his CPAP Recommend he continue Entresto 24/26 mg twice daily, metoprolol succinate 25 daily Continue Lasix 40 twice daily, we will check CMP today and BNP Pending on this lab work we recommend he start Jardiance 10 mg daily for 2 weeks would start spironolactone 25 daily with lab work in 1 month  -- Hyperlipidemia Crestor 20 daily, not on his list, we will confirm with him  Sinus tachycardia In the setting of cardiomyopathy, continue metoprolol succinate 25 daily  OSA on CPAP Stressed compliance with CPAP   Total encounter time more than 30 minutes  Greater than 50% was spent in counseling and coordination of care with the patient    Signed, Esmond Plants, M.D., Ph.D. [place], [place]

## 2021-08-18 ENCOUNTER — Encounter: Payer: Self-pay | Admitting: Cardiovascular Disease

## 2021-08-18 ENCOUNTER — Ambulatory Visit: Payer: Managed Care, Other (non HMO) | Admitting: Cardiovascular Disease

## 2021-08-18 ENCOUNTER — Other Ambulatory Visit: Payer: Self-pay | Admitting: Cardiovascular Disease

## 2021-08-18 VITALS — BP 120/80 | HR 100 | Ht 74.0 in | Wt 217.5 lb

## 2021-08-18 DIAGNOSIS — R079 Chest pain, unspecified: Secondary | ICD-10-CM

## 2021-08-18 DIAGNOSIS — I420 Dilated cardiomyopathy: Secondary | ICD-10-CM

## 2021-08-18 DIAGNOSIS — I428 Other cardiomyopathies: Secondary | ICD-10-CM

## 2021-08-18 DIAGNOSIS — E782 Mixed hyperlipidemia: Secondary | ICD-10-CM

## 2021-08-18 DIAGNOSIS — I700 Atherosclerosis of aorta: Secondary | ICD-10-CM

## 2021-08-18 MED ORDER — EMPAGLIFLOZIN 10 MG PO TABS: 10.0000 mg | ORAL_TABLET | Freq: Every day | ORAL | 0 refills | Status: DC

## 2021-08-18 MED ORDER — EMPAGLIFLOZIN 10 MG PO TABS: 10.0000 mg | ORAL_TABLET | Freq: Every day | ORAL | 3 refills | Status: DC

## 2021-08-18 MED ORDER — SPIRONOLACTONE 25 MG PO TABS: 25.0000 mg | ORAL_TABLET | Freq: Every day | ORAL | 3 refills | Status: DC

## 2021-08-18 NOTE — Patient Instructions (Addendum)
Medication Instructions:  Continue metoprolol succinate 25 mg daily Please start jardiance 10 mg daily After 2 weeks, start spironolactone 25 mg daily  If you need a refill on your cardiac medications before your next appointment, please call your pharmacy.   Lab work: CMP, BNP today and in one month  Testing/Procedures: No new testing needed  Follow-Up: At [facility], you and your health needs are our priority.  As part of our continuing mission to provide you with exceptional heart care, we have created designated Provider Care Teams.  These Care Teams include your primary Cardiologist (physician) and Advanced Practice Providers (APPs -  Physician Assistants and Nurse Practitioners) who all work together to provide you with the care you need, when you need it.  You will need a follow up appointment in 3 months  Providers on your designated Care Team:   Murray Hodgkins, NP Christell Faith, PA-C Cadence Kathlen Mody, [place]  COVID-19 Vaccine Information can be found at: [email or web address].uk For questions related to vaccine distribution or appointments, please email [email or web address] or call [phone].

## 2021-08-19 LAB — COMPREHENSIVE METABOLIC PANEL
ALT: 27 IU/L (ref 0–44)
AST: 19 IU/L (ref 0–40)
Albumin/Globulin Ratio: 1.5 (ref 1.2–2.2)
Albumin: 4.1 g/dL (ref 3.8–4.9)
Alkaline Phosphatase: 56 IU/L (ref 44–121)
BUN/Creatinine Ratio: 13 (ref 9–20)
BUN: 15 mg/dL (ref 6–24)
Bilirubin Total: 0.3 mg/dL (ref 0.0–1.2)
CO2: 24 mmol/L (ref 20–29)
Calcium: 9.7 mg/dL (ref 8.7–10.2)
Chloride: 101 mmol/L (ref 96–106)
Creatinine, Ser: 1.16 mg/dL (ref 0.76–1.27)
Globulin, Total: 2.7 g/dL (ref 1.5–4.5)
Glucose: 100 mg/dL — ABNORMAL HIGH (ref 70–99)
Potassium: 4 mmol/L (ref 3.5–5.2)
Sodium: 139 mmol/L (ref 134–144)
Total Protein: 6.8 g/dL (ref 6.0–8.5)
eGFR: 74 mL/min/{1.73_m2} (ref >59)

## 2021-08-19 LAB — BRAIN NATRIURETIC PEPTIDE: BNP: 108.7 pg/mL — ABNORMAL HIGH (ref 0.0–100.0)

## 2021-08-30 ENCOUNTER — Ambulatory Visit: Payer: Managed Care, Other (non HMO) | Admitting: Adult Health

## 2021-08-30 ENCOUNTER — Encounter: Payer: Self-pay | Admitting: Adult Health

## 2021-08-30 VITALS — BP 118/70 | HR 100 | Temp 98.1°F | Ht 74.0 in | Wt 214.0 lb

## 2021-08-30 DIAGNOSIS — Z87891 Personal history of nicotine dependence: Secondary | ICD-10-CM

## 2021-08-30 DIAGNOSIS — G4733 Obstructive sleep apnea (adult) (pediatric): Secondary | ICD-10-CM

## 2021-08-30 NOTE — Patient Instructions (Signed)
Continue on CPAP At bedtime, wear all night long and with naps.  Saline nasal spray Twice daily  Saline nasal gel At bedtime   Order for dream wear nasal mask.  Work on healthy weight  Do not drive if sleepy  Follow up in 6 months and As needed

## 2021-08-30 NOTE — Progress Notes (Signed)
$'@Patient'K$  ID: Willie Hess, male    DOB: 1965/05/22, 56 y.o.   MRN: [id number]  Chief Complaint  Patient presents with   Follow-up    Referring provider: Bo Merino, FNP  HPI: 56 year old male former smoker seen for sleep consult April 29, 2021 with restless sleep, daytime sleepiness and snoring found to have severe obstructive sleep apnea Congestive heart failure and nonischemic cardiomyopathy diagnosed April 2023 (EF 20 to 25%), prediabetes Participates in the lung cancer screening program.  TEST/EVENTS :  Home sleep study done May 19, 2021 AHI 57.8/hour SPO2 low at 82%   Echo on May 13, 2021 showed EF at 20 to 25%, global hypokinesis, grade 1 diastolic dysfunction.   Cardiac cath on April 11 that showed mild nonobstructive coronary artery disease consistent with nonischemic cardiomyopathy and moderately elevated right heart/pulmonary artery pressures.  And severely elevated left heart pressure.  08/30/2021 Follow up ; OSA  Patient participates for a 62-month follow-up.  Patient was recently diagnosed with severe sleep apnea.  He was started on CPAP at bedtime.  Patient says he tried to wear his CPAP every single night.  Does feel that he has decreased daytime sleepiness since starting it.  Does not like his current full face mask. .  Wants to try dream wear nasal mask.   CPAP download shows excellent compliance with 97% usage.  Daily average usage at 5 hours.  Patient is on auto CPAP 5 to 15 cm H2O.  Daily average pressure at 10 cm H2O.  AHI 2.0. Feels better with less daytime sleepiness with CPAP . More rested . Less tired in evenings. Does not fall asleep watching TV now.   Patient is followed by cardiology for nonischemic cardiomyopathy and congestive heart failure.  Overall says breathing is doing some better.   Plays drums for a band.   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    GERD (gastroesophageal reflux disease)    GERD without esophagitis 12/11/2014   Headache    migraines 1x/6 mos (lesser HAs more often)   History of kidney stones    h/o   Hx of smoking 07/27/2017   Lyme disease    Prediabetes 09/25/2017   Preventative health care 10/12/2015    Tobacco History: Social History   Tobacco Use  Smoking Status Former   Packs/day: 1.00   Years: 32.00   Total pack years: 32.00   Types: Cigarettes   Quit date: 2020   Years since quitting: 3.5  Smokeless Tobacco Never   Counseling given: Not Answered   Outpatient Medications Prior to Visit  Medication Sig Dispense Refill   blood glucose meter kit and supplies Dispense based on patient and insurance preference. Use up to four times daily as directed. (FOR ICD-10 E10.9, E11.9). 1 each 0   empagliflozin (JARDIANCE) 10 MG TABS tablet Take 1 tablet (10 mg total) by mouth daily before breakfast. 90 tablet 0   furosemide (LASIX) 40 MG tablet Take 40 mg by mouth 2 (two) times daily.  losartan (COZAAR) 25 MG tablet Take 25 mg by mouth daily.     Omeprazole-Sodium Bicarbonate (ZEGERID) 20-1100 MG CAPS capsule Take 2 capsules by mouth daily.      spironolactone (ALDACTONE) 25 MG tablet Take 1 tablet (25 mg total) by mouth daily. 30 tablet 3   Blood Glucose Monitoring Suppl (ACCU-CHEK GUIDE ME) w/Device KIT 1 Device by Does not apply route once for 1 dose. (Patient not taking: Reported on 08/18/2021) 1 kit 0   metoprolol succinate (TOPROL XL) 25 MG 24 hr tablet Take 0.5 tablets (12.5 mg total) by mouth daily. (Patient not taking: Reported on 08/30/2021) 90 tablet 3    sacubitril-valsartan (ENTRESTO) 24-26 MG Take 1 tablet by mouth 2 (two) times daily. (Patient not taking: Reported on 08/30/2021) 180 tablet 3   Facility-Administered Medications Prior to Visit  Medication Dose Route Frequency Provider Last Rate Last Admin   sodium chloride flush (NS) 0.9 % injection 3 mL  3 mL Intravenous Q12H Gollan, Tollie Pizza, MD         Review of Systems:   Constitutional:   No  weight loss, night sweats,  Fevers, chills, fatigue, or  lassitude.  HEENT:   No headaches,  Difficulty swallowing,  Tooth/dental problems, or  Sore throat,                No sneezing, itching, ear ache, nasal congestion, post nasal drip,   CV:  No chest pain,  Orthopnea, PND, swelling in lower extremities, anasarca, dizziness, palpitations, syncope.   GI  No heartburn, indigestion, abdominal pain, nausea, vomiting, diarrhea, change in bowel habits, loss of appetite, bloody stools.   Resp: No shortness of breath with exertion or at rest.  No excess mucus, no productive cough,  No non-productive cough,  No coughing up of blood.  No change in color of mucus.  No wheezing.  No chest wall deformity  Skin: no rash or lesions.  GU: no dysuria, change in color of urine, no urgency or frequency.  No flank pain, no hematuria   MS:  No joint pain or swelling.  No decreased range of motion.  No back pain.    Physical Exam  BP 118/70 (BP Location: Left Arm, Cuff Size: Normal)   Pulse 100   Temp 98.1 F (36.7 C) (Temporal)   Ht 6\' 2"  (1.88 m)   Wt 214 lb (97.1 kg)   SpO2 96%   BMI 27.48 kg/m   GEN: A/Ox3; pleasant , NAD, well nourished    HEENT:  [place]/AT,   NOSE-clear, THROAT-clear, no lesions, no postnasal drip or exudate noted.  Class 2 MP airway   NECK:  Supple w/ fair ROM; no JVD; normal carotid impulses w/o bruits; no thyromegaly or nodules palpated; no lymphadenopathy.    RESP  Clear  P & A; w/o, wheezes/ rales/ or rhonchi. no accessory muscle use, no dullness to percussion  CARD:   RRR, no m/r/g, no peripheral edema, pulses intact, no cyanosis or clubbing.  GI:   Soft & nt; nml bowel sounds; no organomegaly or masses detected.   Musco: Warm bil, no deformities or joint swelling noted.   Neuro: alert, no focal deficits noted.    Skin: Warm, no lesions or rashes    Lab Results:  CBC    Component Value Date/Time   WBC 7.9 05/18/2021 1606   WBC 10.1 09/16/2014 1337   RBC 5.01 05/18/2021 1606   RBC 4.93 09/16/2014 1337   HGB 16.1 05/18/2021 1606  HCT 47.4 05/18/2021 1606   PLT 273 05/18/2021 1606   MCV 95 05/18/2021 1606   MCH 32.1 05/18/2021 1606   MCH 30.2 09/16/2014 1337   MCHC 34.0 05/18/2021 1606   MCHC 33.8 09/16/2014 1337   RDW 14.2 05/18/2021 1606   LYMPHSABS 1.8 03/19/2021 0948   EOSABS 0.1 03/19/2021 0948   BASOSABS 0.1 03/19/2021 0948    BMET    Component Value Date/Time   NA 139 08/18/2021 0902   K 4.0 08/18/2021 0902   CL 101 08/18/2021 0902   CO2 24 08/18/2021 0902   GLUCOSE 100 (H) 08/18/2021 0902   GLUCOSE 132 (H) 09/16/2014 1337   BUN 15 08/18/2021 0902   CREATININE 1.16 08/18/2021 0902   CALCIUM 9.7 08/18/2021 0902   GFRNONAA 84 04/09/2020 0000   GFRAA 97 04/09/2020 0000    BNP    Component Value Date/Time   BNP 108.7 (H) 08/18/2021 0902    ProBNP No results found for: "PROBNP"  Imaging: No results found.        No data to display          No results found for: "NITRICOXIDE"      Assessment & Plan:   OSA (obstructive sleep apnea) Significantly improved control on CPAP , Great compliance .  Change to dream wear nasal for comfort   Plan  Patient Instructions  Continue on CPAP At bedtime, wear all night long and with naps.  Saline nasal spray Twice daily  Saline nasal gel At bedtime   Order for dream wear nasal mask.  Work on healthy weight  Do not drive if sleepy  Follow up in 6 months and As needed       Hx of smoking Continue with lung cancer screening program      Rexene Edison,  NP 08/30/2021

## 2021-08-30 NOTE — Assessment & Plan Note (Signed)
Significantly improved control on CPAP , Great compliance .  Change to dream wear nasal for comfort   Plan  Patient Instructions  Continue on CPAP At bedtime, wear all night long and with naps.  Saline nasal spray Twice daily  Saline nasal gel At bedtime   Order for dream wear nasal mask.  Work on healthy weight  Do not drive if sleepy  Follow up in 6 months and As needed

## 2021-08-30 NOTE — Progress Notes (Signed)
Reviewed and agree with assessment/plan.   Chesley Mires, MD [facility] Pulmonary/Critical Care 08/30/2021, 10:30 AM Pager:  [phone]

## 2021-08-30 NOTE — Assessment & Plan Note (Signed)
Continue with lung cancer screening program

## 2021-11-12 ENCOUNTER — Other Ambulatory Visit: Payer: Self-pay | Admitting: Nurse Practitioner

## 2021-11-15 ENCOUNTER — Other Ambulatory Visit: Payer: Self-pay

## 2021-11-15 ENCOUNTER — Telehealth: Payer: Self-pay | Admitting: Cardiovascular Disease

## 2021-11-15 MED ORDER — FUROSEMIDE 40 MG PO TABS: 40.0000 mg | ORAL_TABLET | Freq: Two times a day (BID) | ORAL | 0 refills | Status: DC

## 2021-11-15 NOTE — Telephone Encounter (Signed)
*  STAT* If patient is at the pharmacy, call can be transferred to refill team.   1. Which medications need to be refilled? (please list name of each medication and dose if known) furosemide (LASIX) 40 MG tablet   2. Which pharmacy/location (including street and city if local pharmacy) is medication to be sent to?  [id number] - [place], [place] - [address]    3. Do they need a 30 day or 90 day supply? 90  

## 2021-11-16 ENCOUNTER — Telehealth: Payer: Self-pay | Admitting: *Deleted

## 2021-11-16 DIAGNOSIS — I700 Atherosclerosis of aorta: Secondary | ICD-10-CM

## 2021-11-16 DIAGNOSIS — I429 Cardiomyopathy, unspecified: Secondary | ICD-10-CM

## 2021-11-16 DIAGNOSIS — I420 Dilated cardiomyopathy: Secondary | ICD-10-CM

## 2021-11-16 DIAGNOSIS — I428 Other cardiomyopathies: Secondary | ICD-10-CM

## 2021-11-16 NOTE — Telephone Encounter (Signed)
Spoke with patient and lab orders are entered and up front for him to pick up. He verbalized understanding with no further questions at this time.

## 2021-11-16 NOTE — Telephone Encounter (Signed)
Spoke with patient and advised we will be happy to change those labs to be done over at [facility] and then he can pick them up. I don't see what labs are needed so advised I would call back with more information.

## 2021-11-16 NOTE — Telephone Encounter (Signed)
-----   Message from Britt Bottom, [place] sent at 11/16/2021  3:29 PM EDT ----- Regarding: labs Pt works for [employer] and doesn't want to go to medical mall. I am with Arida this afternoon.  He would like those orders forwarded either via mychart or printed to pick up. ----- Message ----- From: Britt Bottom, Day: 11/16/2021   2:11 PM EDT To: Valora Corporal, RN  Pt has 3 month f/u with Sharolyn Douglas. Pt pending 1 month labs. Please advise if ok to keep future appointment.

## 2021-11-20 LAB — COMPREHENSIVE METABOLIC PANEL
ALT: 34 IU/L (ref 0–44)
AST: 28 IU/L (ref 0–40)
Albumin/Globulin Ratio: 1.5 (ref 1.2–2.2)
Albumin: 4.5 g/dL (ref 3.8–4.9)
Alkaline Phosphatase: 62 IU/L (ref 44–121)
BUN/Creatinine Ratio: 13 (ref 9–20)
BUN: 15 mg/dL (ref 6–24)
Bilirubin Total: 0.5 mg/dL (ref 0.0–1.2)
CO2: 20 mmol/L (ref 20–29)
Calcium: 9.6 mg/dL (ref 8.7–10.2)
Chloride: 95 mmol/L — ABNORMAL LOW (ref 96–106)
Creatinine, Ser: 1.12 mg/dL (ref 0.76–1.27)
Globulin, Total: 3 g/dL (ref 1.5–4.5)
Glucose: 97 mg/dL (ref 70–99)
Potassium: 4.6 mmol/L (ref 3.5–5.2)
Sodium: 134 mmol/L (ref 134–144)
Total Protein: 7.5 g/dL (ref 6.0–8.5)
eGFR: 77 mL/min/{1.73_m2} (ref >59)

## 2021-11-20 LAB — BRAIN NATRIURETIC PEPTIDE: BNP: 77.4 pg/mL (ref 0.0–100.0)

## 2021-11-22 ENCOUNTER — Encounter: Payer: Self-pay | Admitting: Nurse Practitioner

## 2021-11-22 ENCOUNTER — Other Ambulatory Visit: Payer: Self-pay

## 2021-11-22 ENCOUNTER — Ambulatory Visit: Payer: Managed Care, Other (non HMO) | Admitting: Nurse Practitioner

## 2021-11-22 VITALS — BP 128/74 | HR 110 | Temp 98.3°F | Resp 18 | Ht 74.0 in | Wt 211.5 lb

## 2021-11-22 DIAGNOSIS — J069 Acute upper respiratory infection, unspecified: Secondary | ICD-10-CM

## 2021-11-22 LAB — POCT INFLUENZA A/B
Influenza A, POC: NEGATIVE
Influenza B, POC: NEGATIVE

## 2021-11-22 LAB — POCT RAPID STREP A (OFFICE): Rapid Strep A Screen: NEGATIVE

## 2021-11-22 NOTE — Progress Notes (Signed)
BP 128/74   Pulse (!) 110   Temp 98.3 F (36.8 C) (Oral)   Resp 18   Ht '6\' 2"'$  (1.88 m)   Wt 211 lb 8 oz (95.9 kg)   SpO2 97%   BMI 27.15 kg/m    Subjective:    Patient ID: Willie Hess, male    DOB: Nov 25, 1965, 56 y.o.   MRN: [id number]  HPI: Willie Hess is a 56 y.o. male  Chief Complaint  Patient presents with   Fever    Fever 99.9, chills, sweating, bodyaches cough for 1 week. Covid test negative   URI:  Patient states that his symptoms started last Sunday ( 8 days ago).  He says that he has nasal congestion, fever, and cough.  He says he is feeling a little short of breath.  He says yesterday he had a temp of 102.  He says he does have a little productive cough but mainly dry.  He says he took some Nyquil last night and Dayquil this morning.  Will get covid pcr, flu and strep test.  Patient's lung sounds are clear.  No wheezing or rhonchi noted.  Recommend start taking tylenol,  mucinex, flonase and zyrtec.  Push fluids and get rest.  Discussed with patient if he develops worsening shortness of breath, cough or wheezing will get chest xray.   Relevant past medical, surgical, family and social history reviewed and updated as indicated. Interim medical history since our last visit reviewed. Allergies and medications reviewed and updated.  Review of Systems  Constitutional: positive for fever or negative for weight change.  HEENT: positive for nasal congestion Respiratory: positive for cough and shortness of breath.   Cardiovascular: Negative for chest pain or palpitations.  Gastrointestinal: Negative for abdominal pain, no bowel changes.  Musculoskeletal: Negative for gait problem or joint swelling.  Skin: Negative for rash.  Neurological: Negative for dizziness or headache.  No other specific complaints in a complete review of systems (except as listed in HPI above).      Objective:    BP 128/74   Pulse (!) 110   Temp 98.3 F (36.8 C) (Oral)   Resp 18   Ht '6\' 2"'$   (1.88 m)   Wt 211 lb 8 oz (95.9 kg)   SpO2 97%   BMI 27.15 kg/m   Wt Readings from Last 3 Encounters:  11/22/21 211 lb 8 oz (95.9 kg)  08/30/21 214 lb (97.1 kg)  08/18/21 217 lb 8 oz (98.7 kg)    Physical Exam  Constitutional: Patient appears well-developed and well-nourished. No distress.  HEENT: head atraumatic, normocephalic, pupils equal and reactive to light, ears TMs clear, neck supple, throat erythematous Cardiovascular: Normal rate, regular rhythm and normal heart sounds.  No murmur heard. No BLE edema. Pulmonary/Chest: Effort normal and breath sounds normal. No respiratory distress. Abdominal: Soft.  There is no tenderness. Psychiatric: Patient has a normal mood and affect. behavior is normal. Judgment and thought content normal.  Results for orders placed or performed in visit on 11/22/21  POCT Influenza A/B  Result Value Ref Range   Influenza A, POC Negative Negative   Influenza B, POC Negative Negative  POCT rapid strep A  Result Value Ref Range   Rapid Strep A Screen Negative Negative      Assessment & Plan:   Problem List Items Addressed This Visit   None Visit Diagnoses     Viral upper respiratory tract infection    -  Primary  start taking zyrtec, flonase, mucinex and tylenol.  Push fluids and get rest.  Strep and flu negative, pending covid results.    Relevant Orders   Novel Coronavirus, NAA ([facility])   POCT Influenza A/B (Completed)   POCT rapid strep A (Completed)        Follow up plan: Return if symptoms worsen or fail to improve.

## 2021-11-23 ENCOUNTER — Ambulatory Visit: Payer: Managed Care, Other (non HMO) | Admitting: Nurse Practitioner

## 2021-11-23 ENCOUNTER — Ambulatory Visit: Payer: Self-pay | Admitting: *Deleted

## 2021-11-23 LAB — NOVEL CORONAVIRUS, NAA: SARS-CoV-2, NAA: NOT DETECTED

## 2021-11-23 NOTE — Progress Notes (Unsigned)
   There were no vitals taken for this visit.   Subjective:    Patient ID: Willie Hess, male    DOB: 06-Feb-1966, 56 y.o.   MRN: [id number]  HPI: Willie Hess is a 56 y.o. male  No chief complaint on file.  Worsening URI: Saw patient on 11/22/2021 for URI.  Symptoms started last Sunday 8 days ago. Performed flu, rapid strep and Covid PCR all were negative.  Recommend patient take zyrtec, Flonase, mucinex and tylenol. Patient reports that his symptoms are worsening.  Patient states that his throat is worse, cough and fever has continued.    Relevant past medical, surgical, family and social history reviewed and updated as indicated. Interim medical history since our last visit reviewed. Allergies and medications reviewed and updated.  Review of Systems  Per HPI unless specifically indicated above     Objective:    There were no vitals taken for this visit.  Wt Readings from Last 3 Encounters:  11/22/21 211 lb 8 oz (95.9 kg)  08/30/21 214 lb (97.1 kg)  08/18/21 217 lb 8 oz (98.7 kg)    Physical Exam  Results for orders placed or performed in visit on 11/22/21  Novel Coronavirus, NAA ([facility])   Specimen: Nasopharyngeal(NP) swabs in vial transport medium   Nasopharynge  Previous  Result Value Ref Range   SARS-CoV-2, NAA Not Detected Not Detected  POCT Influenza A/B  Result Value Ref Range   Influenza A, POC Negative Negative   Influenza B, POC Negative Negative  POCT rapid strep A  Result Value Ref Range   Rapid Strep A Screen Negative Negative      Assessment & Plan:   Problem List Items Addressed This Visit   None    Follow up plan: No follow-ups on file.

## 2021-11-23 NOTE — Telephone Encounter (Signed)
Summary: elevated temperature/ cold and flu like symptoms   The patient shares that they're experienced an elevated temperature of roughly (102 degrees) today and would like to speak with a member of staff further   The patient shares that they've also experienced cough, congestion and a sore throat   Please contact the patient further when possible        Chief Complaint: fever 102. Negative for covid, strep., flu.  Symptoms: fever 102. Only goes down 101 with tylenol. C/o sore throat worsening. Pain with swallowing, dry cough, body aches.  Frequency: sx started 'Sunday 11/21/21 Pertinent Negatives: Patient denies chest pain no difficulty breathing  Disposition: []ED /[]Urgent Care (no appt availability in office) / [x]Appointment(In office/virtual)/ [] [facility]/ []Home Care/ []Refused Recommended Disposition /[][facility] [facility]/ [] Follow-up with PCP Additional Notes:   Appt already scheduled for tomorrow. Last seen 11/22/21 and now fever not decreasing. Please advise      Reason for Disposition  Fever present > 3 days (72 hours)  Answer Assessment - Initial Assessment Questions 1. ONSET: "When did the throat start hurting?" (Hours or days ago)      11/21/21 Sunday  2. SEVERITY: "How bad is the sore throat?" (Scale 1-10; mild, moderate or severe)   - MILD (1-3):  Doesn\'t interfere with eating or normal activities.   - MODERATE (4-7): Interferes with eating some solids and normal activities.   - SEVERE (8-10):  Excruciating pain, interferes with most normal activities.   - SEVERE WITH DYSPHAGIA (10): Can\'t swallow liquids, drooling.     Moderate pain with swallowing  3. STREP EXPOSURE: "Has there been any exposure to strep within the past week?" If Yes, ask: "What type of contact occurred?"      na 4.  VIRAL SYMPTOMS: "Are there any symptoms of a cold, such as a runny nose, cough, hoarse voice or red eyes?"      Cough dry, congestion, sore throat, fever 102 5.  FEVER: "Do you have a fever?" If Yes, ask: "What is your temperature, how was it measured, and when did it start?"     102 6. PUS ON THE TONSILS: "Is there pus on the tonsils in the back of your throat?"     na 7. OTHER SYMPTOMS: "Do you have any other symptoms?" (e.g., difficulty breathing, headache, rash)     No  8. PREGNANCY: "Is there any chance you are pregnant?" "When was your last menstrual period?"     na  Answer Assessment - Initial Assessment Questions 1. TEMPERATURE: "What is the most recent temperature?"  "How was it measured?"      10'$ 2 2. ONSET: "When did the fever start?"      Sunday 02/21/21 3. CHILLS: "Do you have chills?" If yes: "How bad are they?"  (e.g., none, mild, moderate, severe)   - NONE: no chills   - MILD: feeling cold   - MODERATE: feeling very cold, some shivering (feels better under a thick blanket)   - SEVERE: feeling extremely cold with shaking chills (general body shaking, rigors; even under a thick blanket)      Na  4. OTHER SYMPTOMS: "Do you have any other symptoms besides the fever?"  (e.g., abdomen pain, cough, diarrhea, earache, headache, sore throat, urination pain)     Sore throat, dry cough, body aches fever 5. CAUSE: If there are no symptoms, ask: "What do you think is causing the fever?"      Na  6.  CONTACTS: "Does anyone else in the family have an infection?"     na 7. TREATMENT: "What have you done so far to treat this fever?" (e.g., medications)     tylenol 8. IMMUNOCOMPROMISE: "Do you have of the following: diabetes, HIV positive, splenectomy, cancer chemotherapy, chronic steroid treatment, transplant patient, etc."     na 9. PREGNANCY: "Is there any chance you are pregnant?" "When was your last menstrual period?"     na 10. TRAVEL: "Have you traveled out of the country in the last month?" (e.g., travel history, exposures)       na  Protocols used: Sore Throat-A-AH, [facility]

## 2021-11-23 NOTE — Telephone Encounter (Unsigned)
Copied from [place] [id number]. Topic: General - Other >> Nov 23, 2021  3:19 PM Oley Balm E wrote: Reason for CRM: Pt called reporting that he needs an extension out of work because he still has a fever. Please advise  Says letter can be uploaded via mychart  Also wants to know if he should schedule another appt?

## 2021-11-24 ENCOUNTER — Encounter: Payer: Self-pay | Admitting: Nurse Practitioner

## 2021-11-24 ENCOUNTER — Other Ambulatory Visit
Admission: RE | Admit: 2021-11-24 | Discharge: 2021-11-24 | Disposition: A | Discharge status: Home / Self Care | Payer: Managed Care, Other (non HMO) | Source: Ambulatory Visit | Attending: Nurse Practitioner | Admitting: Nurse Practitioner

## 2021-11-24 ENCOUNTER — Ambulatory Visit: Payer: Managed Care, Other (non HMO) | Admitting: Nurse Practitioner

## 2021-11-24 ENCOUNTER — Other Ambulatory Visit: Payer: Self-pay | Admitting: Nurse Practitioner

## 2021-11-24 ENCOUNTER — Telehealth: Payer: Self-pay | Admitting: *Deleted

## 2021-11-24 ENCOUNTER — Ambulatory Visit
Admission: RE | Admit: 2021-11-24 | Discharge: 2021-11-24 | Disposition: A | Discharge status: Home / Self Care | Payer: Managed Care, Other (non HMO) | Source: Ambulatory Visit | Attending: Nurse Practitioner | Admitting: Nurse Practitioner

## 2021-11-24 ENCOUNTER — Other Ambulatory Visit: Payer: Self-pay

## 2021-11-24 VITALS — BP 128/76 | HR 120 | Temp 98.0°F | Resp 18 | Ht 74.0 in | Wt 210.2 lb

## 2021-11-24 DIAGNOSIS — R509 Fever, unspecified: Secondary | ICD-10-CM

## 2021-11-24 DIAGNOSIS — J189 Pneumonia, unspecified organism: Secondary | ICD-10-CM

## 2021-11-24 DIAGNOSIS — J069 Acute upper respiratory infection, unspecified: Secondary | ICD-10-CM | POA: Diagnosis not present

## 2021-11-24 LAB — CBC WITH DIFFERENTIAL/PLATELET
Abs Immature Granulocytes: 0.03 10*3/uL (ref 0.00–0.07)
Basophils Absolute: 0.1 10*3/uL (ref 0.0–0.1)
Basophils Relative: 1 %
Eosinophils Absolute: 0.1 10*3/uL (ref 0.0–0.5)
Eosinophils Relative: 1 %
HCT: 44.2 % (ref 39.0–52.0)
Hemoglobin: 15.8 g/dL (ref 13.0–17.0)
Immature Granulocytes: 0 %
Lymphocytes Relative: 12 %
Lymphs Abs: 1.1 10*3/uL (ref 0.7–4.0)
MCH: 34.3 pg — ABNORMAL HIGH (ref 26.0–34.0)
MCHC: 35.7 g/dL (ref 30.0–36.0)
MCV: 96.1 fL (ref 80.0–100.0)
Monocytes Absolute: 1.1 10*3/uL — ABNORMAL HIGH (ref 0.1–1.0)
Monocytes Relative: 12 %
Neutro Abs: 6.8 10*3/uL (ref 1.7–7.7)
Neutrophils Relative %: 74 %
Platelets: 210 10*3/uL (ref 150–400)
RBC: 4.6 MIL/uL (ref 4.22–5.81)
RDW: 13.5 % (ref 11.5–15.5)
WBC: 9.2 10*3/uL (ref 4.0–10.5)
nRBC: 0 % (ref 0.0–0.2)

## 2021-11-24 LAB — COMPREHENSIVE METABOLIC PANEL
ALT: 25 U/L (ref 0–44)
AST: 25 U/L (ref 15–41)
Albumin: 3.3 g/dL — ABNORMAL LOW (ref 3.5–5.0)
Alkaline Phosphatase: 45 U/L (ref 38–126)
Anion gap: 8 (ref 5–15)
BUN: 12 mg/dL (ref 6–20)
CO2: 22 mmol/L (ref 22–32)
Calcium: 9.1 mg/dL (ref 8.9–10.3)
Chloride: 106 mmol/L (ref 98–111)
Creatinine, Ser: 1.11 mg/dL (ref 0.61–1.24)
GFR, Estimated: >60 mL/min (ref >60)
Glucose, Bld: 142 mg/dL — ABNORMAL HIGH (ref 70–99)
Potassium: 4 mmol/L (ref 3.5–5.1)
Sodium: 136 mmol/L (ref 135–145)
Total Bilirubin: 0.6 mg/dL (ref 0.3–1.2)
Total Protein: 7.6 g/dL (ref 6.5–8.1)

## 2021-11-24 LAB — C-REACTIVE PROTEIN: CRP: 17.6 mg/dL — ABNORMAL HIGH (ref <1.0)

## 2021-11-24 LAB — SEDIMENTATION RATE: Sed Rate: 60 mm/hr — ABNORMAL HIGH (ref 0–20)

## 2021-11-24 MED ORDER — DOXYCYCLINE HYCLATE 100 MG PO TABS: 100.0000 mg | ORAL_TABLET | Freq: Two times a day (BID) | ORAL | 0 refills | Status: AC

## 2021-11-24 MED ORDER — BENZONATATE 100 MG PO CAPS: 100.0000 mg | ORAL_CAPSULE | Freq: Two times a day (BID) | ORAL | 0 refills | Status: DC | PRN

## 2021-11-24 MED ORDER — AMOXICILLIN-POT CLAVULANATE 875-125 MG PO TABS: 1.0000 | ORAL_TABLET | Freq: Two times a day (BID) | ORAL | 0 refills | Status: AC

## 2021-11-24 NOTE — Telephone Encounter (Signed)
Patient came for appointment

## 2021-11-24 NOTE — Telephone Encounter (Signed)
Patient has been notified of results.  

## 2021-11-24 NOTE — Telephone Encounter (Signed)
'  Willie Hess' with [facility] called with critical CXR results.   Available in Epic.  Called practice, spoke with Melissa.    FINDINGS: Airspace opacities in the lingula, compatible with pneumonia. No visible pleural effusions or pneumothorax. Cardiomediastinal silhouette is within normal limits.   IMPRESSION: Positive for pneumonia in the lingula. Followup PA and lateral chest X-ray is recommended in 3-4 weeks following trial of antibiotic therapy to ensure resolution and exclude underlying malignancy.   These results will be called to the ordering clinician or representative by the Radiologist Assistant, and communication documented in the PACS or [employer].

## 2021-11-25 ENCOUNTER — Telehealth: Payer: Self-pay | Admitting: Cardiovascular Disease

## 2021-11-25 ENCOUNTER — Encounter: Payer: Self-pay | Admitting: Nurse Practitioner

## 2021-11-25 NOTE — Telephone Encounter (Signed)
I called and spoke with the patient regarding his recent lab results and Dr. Donivan Scull recommendations as stated below: - Confirm not taking losartan & remove from med list - start Jardiance 10 mg once daily   The patient voices understanding of his labs, but his current medication list is inaccurate.   Per the patient, he is taking: - Losartan 25 mg once daily (but advised to stop this now and he voices understanding - Entresto 24/26 mg BID - Lasix 40 mg BID - Spironolactone 25 mg QD - Jardiance 10 mg QD (already taking, but not on his medication list)  - Metoprolol succinate 25 mg- take 0.5 tablet once daily is on his med list, but he states he is not taking this as he was told to stop it.   I have advised the patient that I do not see in his chart where metoprolol was recommended to be stopped. I will clarify with Dr. Rockey Situ if he needs to be on this .  He also states the pharmacy keeps filling rosuvastatin, but he is not taking this as he was told he didn't need it.   We will need to: 1) Clarify with Dr. Rockey Situ if metoprolol is needed 2) Call the pharmacy and have them d/c orders for rosuvastatin & losartan.  Will wait to confirm metoprolol with Dr. Rockey Situ prior to calling the pharmacy .   The patient is aware we will call him back regarding metoprolol.   He voices understanding of the above and is agreeable.

## 2021-11-25 NOTE — Telephone Encounter (Signed)
Willie Merritts, MD  11/23/2021 10:36 AM EDT     Lab work reviewed BNP normal I reviewed his medication list, can we make sure he is not on losartan, he is on Entresto We can take losartan off his list Would also like to add Jardiance 10 mg daily

## 2021-11-26 NOTE — Telephone Encounter (Signed)
I called and spoke with the patient regarding Dr. Donivan Scull recommendations to resume: - metoprolol succinate 25 mg- take 0.5 tablet (12.5 mg) by mouth once daily   The patient voices understanding and is agreeable. He states he does still have this at home and will resume.  I advised him I will call [facility] and ask them to cancel future orders for his rosuvastatin & losartan.  The patient was very appreciative of the call back.  I have called [facility] in [place] and left a message to please discontinue future RX's for losartan and rosuvastatin. I asked that they call back with any questions.

## 2021-11-26 NOTE — Telephone Encounter (Signed)
Willie Merritts, MD  Sent: Fri November 26, 2021  3:17 PM  To: Emily Filbert, RN          Message  I would continue the metoprolol succinate 12.5 mg daily  Thx  TGollan

## 2021-11-30 ENCOUNTER — Encounter: Payer: Self-pay | Admitting: Nurse Practitioner

## 2021-11-30 ENCOUNTER — Telehealth: Payer: Self-pay | Admitting: Nurse Practitioner

## 2021-11-30 NOTE — Telephone Encounter (Signed)
Letter faxed.

## 2021-11-30 NOTE — Telephone Encounter (Unsigned)
Copied from [place] [id number]. Topic: General - Other >> Nov 30, 2021 11:23 AM Leitha Schuller wrote: Pt requesting a work excuse from 10-11 faxed to his employer  Fax [phone]

## 2021-12-10 ENCOUNTER — Encounter: Payer: Self-pay | Admitting: Nurse Practitioner

## 2021-12-10 ENCOUNTER — Ambulatory Visit: Payer: Managed Care, Other (non HMO) | Attending: Nurse Practitioner | Admitting: Nurse Practitioner

## 2021-12-10 ENCOUNTER — Ambulatory Visit (INDEPENDENT_AMBULATORY_CARE_PROVIDER_SITE_OTHER): Payer: Managed Care, Other (non HMO)

## 2021-12-10 VITALS — BP 122/90 | HR 91 | Ht 74.0 in | Wt 211.0 lb

## 2021-12-10 DIAGNOSIS — E782 Mixed hyperlipidemia: Secondary | ICD-10-CM | POA: Diagnosis not present

## 2021-12-10 DIAGNOSIS — I428 Other cardiomyopathies: Secondary | ICD-10-CM | POA: Diagnosis not present

## 2021-12-10 DIAGNOSIS — R55 Syncope and collapse: Secondary | ICD-10-CM | POA: Diagnosis not present

## 2021-12-10 DIAGNOSIS — I5022 Chronic systolic (congestive) heart failure: Secondary | ICD-10-CM | POA: Diagnosis not present

## 2021-12-10 MED ORDER — METOPROLOL SUCCINATE ER 25 MG PO TB24: 25.0000 mg | ORAL_TABLET | Freq: Every day | ORAL | 2 refills | Status: DC

## 2021-12-10 NOTE — Patient Instructions (Signed)
Medication Instructions:   INCREASE Metoprolol - take one tablet ('25mg'$ ) by mouth daily.   *If you need a refill on your cardiac medications before your next appointment, please call your pharmacy*   Lab Work:  None Ordered  If you have labs (blood work) drawn today and your tests are completely normal, you will receive your results only by: [place] (if you have MyChart) OR A paper copy in the mail If you have any lab test that is abnormal or we need to change your treatment, we will call you to review the results.   Testing/Procedures:  Your physician has recommended that you wear a Zio AT monitor.   This monitor is a medical device that records the heart's electrical activity. Doctors most often use these monitors to diagnose arrhythmias. Arrhythmias are problems with the speed or rhythm of the heartbeat. The monitor is a small device applied to your chest. You can wear one while you do your normal daily activities. While wearing this monitor if you have any symptoms to push the button and record what you felt. Once you have worn this monitor for the period of time provider prescribed (Usually 14 days), you will return the monitor device in the postage paid box. Once it is returned they will download the data collected and provide [place] with a report which the provider will then review and we will call you with those results. Important tips:  Avoid showering during the first 24 hours of wearing the monitor. Avoid excessive sweating to help maximize wear time. Do not submerge the device, no hot tubs, and no swimming pools. Keep any lotions or oils away from the patch. After 24 hours you may shower with the patch on. Take brief showers with your back facing the shower head.  Do not remove patch once it has been placed because that will interrupt data and decrease adhesive wear time. Push the button when you have any symptoms and write down what you were feeling. Once you have  completed wearing your monitor, remove and place into box which has postage paid and place in your outgoing mailbox.  If for some reason you have misplaced your box then call our office and we can provide another box and/or mail it off for you.  2. Echocardiogram   Your physician has requested that you have an echocardiogram. Echocardiography is a painless test that uses sound waves to create images of your heart. It provides your doctor with information about the size and shape of your heart and how well your heart's chambers and valves are working. This procedure takes approximately one hour. There are no restrictions for this procedure. Please note; depending on visual quality an IV may need to be placed.    Follow-Up: At [facility], you and your health needs are our priority.  As part of our continuing mission to provide you with exceptional heart care, we have created designated Provider Care Teams.  These Care Teams include your primary Cardiologist (physician) and Advanced Practice Providers (APPs -  Physician Assistants and Nurse Practitioners) who all work together to provide you with the care you need, when you need it.  We recommend signing up for the patient portal called "MyChart".  Sign up information is provided on this After Visit Summary.  MyChart is used to connect with patients for Virtual Visits (Telemedicine).  Patients are able to view lab/test results, encounter notes, upcoming appointments, etc.  Non-urgent messages can be sent to your provider  as well.   To learn more about what you can do with MyChart, go to [email or web address].    Your next appointment:   1 month(s)  The format for your next appointment:   In Person  Provider:   You may see Ida Rogue, MD or one of the following Advanced Practice Providers on your designated Care Team:   Murray Hodgkins, NP Christell Faith, PA-C Cadence Kathlen Mody, PA-C Gerrie Nordmann, NP

## 2021-12-10 NOTE — Progress Notes (Signed)
Office Visit    Patient Name: Willie Hess Date of Encounter: 12/10/2021  Primary Care Provider:  Bo Merino, FNP Primary Cardiologist:  Ida Rogue, MD  Chief Complaint    56 year old male with a history of nonobstructive CAD, nonischemic cardiomyopathy, chronic HFrEF (EF 20 to 25% March 2023), hyperlipidemia, prediabetes, GERD, obstructive sleep apnea, and prior tobacco abuse, who presents for follow-up of heart failure.  Past Medical History    Past Medical History:  Diagnosis Date   Arthritis    Benign neoplasm of ascending colon    Benign neoplasm of cecum    Benign neoplasm of sigmoid colon    Benign neoplasm of transverse colon    Chronic HFrEF (heart failure with reduced ejection fraction) ([place])    a. 04/2021 Echo: EF 20-25%.   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 04/2021 Echo: EF 20-25%, glob HK, GrI DD, nl RV fxn, mod dil LA, mild MR, triv AI; b. 05/2021 Cath: nonobs dzs. LVEDP 40, RA 12, RV 50/12, PA 50/30, PCWP 35, CO/CI 4.5/2.0.   Nonobstructive CAD (coronary artery disease)    a. 05/2021 Cath: LM nl, LAD 43m D1-3 nl, RI nl, LCX nl, OM1-3 nl, RCA nl, RPDA/PAV/PL1-3 nl.   Obstructive sleep apnea    Prediabetes 09/25/2017   Preventative health care 10/12/2015   Past Surgical History:  Procedure Laterality Date   COLONOSCOPY WITH PROPOFOL N/A 11/12/2015   Procedure: COLONOSCOPY WITH PROPOFOL;  Surgeon: DLucilla Lame MD;  [facility]  Service: Endoscopy;  Laterality: N/A;   COLONOSCOPY WITH PROPOFOL N/A 04/20/2021   Procedure: COLONOSCOPY WITH PROPOFOL;  Surgeon: WLucilla Lame MD;  [facility];  Service: Endoscopy;  Laterality: N/A;   ESOPHAGOGASTRODUODENOSCOPY     INSERTION OF MESH Left 08/24/2017   Procedure:  INSERTION OF MESH;  Surgeon: PJules Husbands MD;  Location: [facility];  Service: General;  Laterality: Left;   POLYPECTOMY  11/12/2015   Procedure: POLYPECTOMY;  Surgeon: DLucilla Lame MD;  [place] [facility]  Service: Endoscopy;;   RIGHT/LEFT HEART CATH AND CORONARY ANGIOGRAPHY  05/25/2021   Procedure: RIGHT/LEFT HEART CATH AND CORONARY ANGIOGRAPHY;  Surgeon: ENelva Bush MD;  [facility];  Service: Cardiovascular;;   ROBOT ASSISTED INGUINAL HERNIA REPAIR Left 08/24/2017   Procedure: ROBOT ASSISTED INGUINAL HERNIA REPAIR;  Surgeon: PJules Husbands MD;  Location: [facility];  Service: General;  Laterality: Left;   URETEROSCOPY WITH HOLMIUM LASER LITHOTRIPSY Left 09/23/2014   Procedure: URETEROSCOPY, CYSTOSCOPY WITH STENT PLACEMENT, HOLMIUM STAND BY ;  Surgeon: MRoyston Cowper MD;  Location: [facility];  Service: Urology;  Laterality: Left;   VASECTOMY     WISDOM TOOTH EXTRACTION      Allergies  Allergies  Allergen Reactions   Bee Venom Swelling    History of Present Illness    56year old male with a history of nonobstructive CAD, nonischemic cardiomyopathy, chronic HFrEF, hyperlipidemia, prediabetes, GERD, obstructive sleep apnea, and prior tobacco abuse.  He was initially evaluated in March 2023 in the setting of abnormal ECG with LVH and strong family history of CAD and heart failure.  He reported fatigue and dyspnea on exertion as well as angina.  Echocardiogram was  performed and showed severely reduced LV function with an EF of 20 to 25% and global hypokinesis.  With this finding, he underwent diagnostic catheterization which showed mild, nonobstructive LAD disease and otherwise normal coronary arteries with elevated filling pressures (LVEDP of 40, wedge of 35), and normal cardiac output and index.  He was subsequently placed on guideline directed medical therapy and furosemide.  Mr. Crocket was last in cardiology clinic in July 2023, at which time he had some  weight gain and increase in abdominal girth.  He was maintained on Lasix 40 mg twice daily with recommendation to start Jardiance and spironolactone.  He was initially confused about his medicines, and stopped taking metoprolol, but has otw been compliant, and resumed metoprolol xl following phone conversation w/ our office on 10/13.  Since his last in office visit, he notes that he has done well.  His weight has come down a little bit and he has not had any significant dyspnea or edema.  He mostly notices volume in his abdomen, which has been stable.  He denies chest pain, dyspnea, palpitations, PND, orthopnea, syncope, or edema.  He has had 2 episodes of presyncope, both occurring while standing and unrelated to recent position changes.  The first occurred about 3 weeks ago with the second occurring just last week.  The second 1 was more severe and he felt as though he might lose consciousness.  Symptoms lasted just a few seconds and resolve spontaneously.  Home Medications    Current Outpatient Medications  Medication Sig Dispense Refill   empagliflozin (JARDIANCE) 10 MG TABS tablet Take 1 tablet (10 mg total) by mouth daily before breakfast.     furosemide (LASIX) 40 MG tablet Take 1 tablet (40 mg total) by mouth 2 (two) times daily. Please keep upcoming appointment for future refills. Thank you. 60 tablet 0   metoprolol succinate (TOPROL XL) 25 MG 24 hr tablet Take 0.5 tablets (12.5 mg total) by mouth daily. 90 tablet 3   Omeprazole-Sodium Bicarbonate (ZEGERID) 20-1100 MG CAPS capsule Take 2 capsules by mouth daily.      sacubitril-valsartan (ENTRESTO) 24-26 MG Take 1 tablet by mouth 2 (two) times daily. 180 tablet 3   spironolactone (ALDACTONE) 25 MG tablet Take 1 tablet (25 mg total) by mouth daily. 30 tablet 3   No current facility-administered medications for this visit.    Review of Systems    Presyncope as outlined above.  He denies chest pain, dyspnea, palpitations, PND, orthopnea,  syncope, edema, or early satiety.  All other systems reviewed and are otherwise negative except as noted above.    Physical Exam    VS:  BP (!) 122/90 (BP Location: Left Arm, Patient Position: Sitting, Cuff Size: Large)   Pulse (!) 106   Ht '6\' 2"'$  (1.88 m)   Wt 211 lb (95.7 kg)   SpO2 98%   BMI 27.09 kg/m  , BMI Body mass index is 27.09 kg/m.     GEN: Well nourished, well developed, in no acute distress. HEENT: normal. Neck: Supple, no JVD, carotid bruits, or masses. Cardiac: RRR, S3, tachycardic no murmurs.  No clubbing, cyanosis, edema.  Radials/PT 2+ and equal bilaterally.  Respiratory:  Respirations regular and unlabored, clear to auscultation bilaterally. GI: Soft, nontender, nondistended, BS + x 4. MS: no deformity or atrophy. Skin: warm and dry, no rash. Neuro:  Strength and sensation are intact. Psych: Normal affect.  Accessory Clinical Findings    ECG personally reviewed by me today -sinus arrhythmia,  91, leftward axis, borderline left atrial enlargement, lateral ST depression with T abnormalities- no acute changes.  Lab Results  Component Value Date   WBC 9.2 11/24/2021   HGB 15.8 11/24/2021   HCT 44.2 11/24/2021   MCV 96.1 11/24/2021   PLT 210 11/24/2021   Lab Results  Component Value Date   CREATININE 1.11 11/24/2021   BUN 12 11/24/2021   NA 136 11/24/2021   K 4.0 11/24/2021   CL 106 11/24/2021   CO2 22 11/24/2021   Lab Results  Component Value Date   ALT 25 11/24/2021   AST 25 11/24/2021   ALKPHOS 45 11/24/2021   BILITOT 0.6 11/24/2021   Lab Results  Component Value Date   CHOL 225 (H) 03/19/2021   HDL 61 03/19/2021   LDLCALC 136 (H) 03/19/2021   TRIG 160 (H) 03/19/2021   CHOLHDL 3.7 03/19/2021    Lab Results  Component Value Date   HGBA1C 5.8 (H) 03/19/2021    Assessment & Plan    1.  Chronic HFrEF/NICM:  EF 20-25% by echo in 04/2021 w/ nonobs CAD on cath.  He has been on guideline directed medical therapy including beta-blocker,  Entresto, spironolactone, and empagliflozin for several months now.  He reports tolerance and compliance.  Heart rates remain elevated today and I am going to increase his metoprolol XL to 25 mg daily.  We will arrange for follow-up 2D echocardiogram to reevaluate LV function on GDMT.  If EF remains depressed, less than 35%, we will likely pursue cardiac MRI prior to referring to electrophysiology for ICD consideration.  We discussed the importance of daily weights, sodium restriction, medication compliance, and symptom reporting and he verbalizes understanding.   2.  Presyncope: Over the past 3 weeks, patient has had 2 episodes of presyncope that occurred suddenly while standing and were unrelated to any recent position changes.  The second episode occurred a week ago and he says he was quite concerned that he was going to lose consciousness.  Follow-up echo as outlined above.  I am also going to place a ZIO monitor to assess for ventricular arrhythmias which might be contributing to symptoms.  Titrating beta-blocker as outlined above.  Stable lab work from October 11.  3.  Hyperlipidemia: LDL of 136 in February 2023.  He was previously placed on rosuvastatin but was subsequently taken off and he is no longer taking.  In light of mild LAD disease and prediabetes, would have a low threshold to resume statin therapy.  Will discuss at follow-up.  4.  Disposition: Follow-up Zio monitor.  Follow-up 2D echocardiogram.  Follow-up in clinic in 6 to 8 weeks or sooner if necessary.   Murray Hodgkins, NP 12/10/2021, 8:52 AM

## 2021-12-14 DIAGNOSIS — R55 Syncope and collapse: Secondary | ICD-10-CM | POA: Diagnosis not present

## 2021-12-17 ENCOUNTER — Other Ambulatory Visit: Payer: Self-pay

## 2021-12-17 MED ORDER — EMPAGLIFLOZIN 10 MG PO TABS: 10.0000 mg | ORAL_TABLET | Freq: Every day | ORAL | 0 refills | Status: DC

## 2021-12-20 ENCOUNTER — Telehealth: Payer: Self-pay | Admitting: Cardiovascular Disease

## 2021-12-20 MED ORDER — EMPAGLIFLOZIN 10 MG PO TABS: 10.0000 mg | ORAL_TABLET | Freq: Every day | ORAL | 0 refills | Status: DC

## 2021-12-20 NOTE — Telephone Encounter (Signed)
empagliflozin (JARDIANCE) 10 MG TABS tablet 90 tablet 0 12/20/2021    Sig - Route: Take 1 tablet (10 mg total) by mouth daily before breakfast. - Oral   Sent to pharmacy as: empagliflozin (JARDIANCE) 10 MG Tab tablet   E-Prescribing Status: Receipt confirmed by pharmacy (12/20/2021  9:40 AM EST)    Pharmacy  [id number] - [place], [address]   Refill already sent to pharmacy this morning.

## 2021-12-20 NOTE — Telephone Encounter (Signed)
*  STAT* If patient is at the pharmacy, call can be transferred to refill team.   1. Which medications need to be refilled? (please list name of each medication and dose if known) Jardiance '10mg'$ ,   2. Which pharmacy/location (including street and city if local pharmacy) is medication to be sent to?[facility], [place]  3. Do they need a 30 day or 90 day supply? 90day

## 2021-12-20 NOTE — Telephone Encounter (Signed)
*  STAT* If patient is at the pharmacy, call can be transferred to refill team.   1. Which medications need to be refilled? (please list name of each medication and dose if known)  empagliflozin (JARDIANCE) 10 MG TABS tablet  2. Which pharmacy/location (including street and city if local pharmacy) is medication to be sent to? [facility] [place], [address] AT [facility] [address]   3. Do they need a 30 day or 90 day supply?  90 day supply  Per Cecille Rubin with [facility], patient's insurance will not cover a 30 day supply. Please send in new Rx for 90 days.

## 2021-12-28 ENCOUNTER — Telehealth: Payer: Self-pay | Admitting: Cardiovascular Disease

## 2021-12-28 MED ORDER — FUROSEMIDE 40 MG PO TABS: 40.0000 mg | ORAL_TABLET | Freq: Two times a day (BID) | ORAL | 0 refills | Status: DC

## 2021-12-28 NOTE — Telephone Encounter (Signed)
*  STAT* If patient is at the pharmacy, call can be transferred to refill team.   1. Which medications need to be refilled? (please list name of each medication and dose if known) furosemide (LASIX) 40 MG tablet  2. Which pharmacy/location (including street and city if local pharmacy) is medication to be sent to?  [facility] [place], [address] AT [facility] [address] [place]    3. Do they need a 30 day or 90 day supply? 90   Pt is out of this medication

## 2021-12-28 NOTE — Telephone Encounter (Signed)
Requested Prescriptions   Signed Prescriptions Disp Refills   furosemide (LASIX) 40 MG tablet 180 tablet 0    Sig: Take 1 tablet (40 mg total) by mouth 2 (two) times daily.    Authorizing Provider: Minna Merritts    Ordering User: Britt Bottom

## 2022-01-03 ENCOUNTER — Ambulatory Visit
Admission: RE | Admit: 2022-01-03 | Discharge: 2022-01-03 | Disposition: A | Discharge status: Home / Self Care | Payer: Managed Care, Other (non HMO) | Source: Ambulatory Visit | Attending: Nurse Practitioner | Admitting: Nurse Practitioner

## 2022-01-03 DIAGNOSIS — F17200 Nicotine dependence, unspecified, uncomplicated: Secondary | ICD-10-CM | POA: Insufficient documentation

## 2022-01-03 DIAGNOSIS — I358 Other nonrheumatic aortic valve disorders: Secondary | ICD-10-CM | POA: Diagnosis not present

## 2022-01-03 DIAGNOSIS — I5022 Chronic systolic (congestive) heart failure: Secondary | ICD-10-CM | POA: Diagnosis not present

## 2022-01-03 DIAGNOSIS — G4730 Sleep apnea, unspecified: Secondary | ICD-10-CM | POA: Diagnosis not present

## 2022-01-03 DIAGNOSIS — I428 Other cardiomyopathies: Secondary | ICD-10-CM | POA: Diagnosis present

## 2022-01-03 LAB — ECHOCARDIOGRAM COMPLETE
AR max vel: 2.29 cm2
AV Area VTI: 2.23 cm2
AV Area mean vel: 1.85 cm2
AV Mean grad: 7 mmHg
AV Peak grad: 10.1 mmHg
Ao pk vel: 1.59 m/s
Area-P 1/2: 6.04 cm2
Calc EF: 32.4 %
S' Lateral: 5.1 cm
Single Plane A2C EF: 33.3 %
Single Plane A4C EF: 35.5 %

## 2022-01-03 MED ORDER — PERFLUTREN LIPID MICROSPHERE
1.0000 mL | INTRAVENOUS | Status: AC | PRN
  Administered 2022-01-03: 3 mL via INTRAVENOUS

## 2022-01-03 NOTE — Progress Notes (Signed)
*  PRELIMINARY RESULTS* Echocardiogram 2D Echocardiogram has been performed.  Willie Hess 01/03/2022, 9:53 AM

## 2022-01-04 ENCOUNTER — Telehealth: Payer: Self-pay | Admitting: *Deleted

## 2022-01-04 DIAGNOSIS — I5022 Chronic systolic (congestive) heart failure: Secondary | ICD-10-CM

## 2022-01-04 DIAGNOSIS — I428 Other cardiomyopathies: Secondary | ICD-10-CM

## 2022-01-04 NOTE — Telephone Encounter (Signed)
-----   Message from Theora Gianotti, NP sent at 01/03/2022  5:34 PM EST ----- Markedly reduced heart squeezing function at 25-30%, which is only slightly better than what was seen in 04/2021.  Heart also moderately stiff.  No significant valvular disease.  With ongoing reduced heart squeeze, I recommend a cardiac MRI to look for possible infiltrative causes of low squeeze and also refer to electrophysiology for ongoing NICM/HFrEF (EP visit scheduled after cMRI completed).

## 2022-01-04 NOTE — Telephone Encounter (Signed)
Results and recommendations reviewed with patient. Advised that I would check on dates available for Cardiac MRI, appointment information, and order these tests then give him a call back. He verbalized understanding with no further questions at this time.

## 2022-01-04 NOTE — Telephone Encounter (Signed)
Called to schedule testing but they will call patient to set that up. Will update patient and send information over to [facility] for scheduling. Will send instructions to patient by My Chart.

## 2022-01-14 ENCOUNTER — Ambulatory Visit: Payer: Managed Care, Other (non HMO) | Admitting: Nurse Practitioner

## 2022-01-19 ENCOUNTER — Telehealth: Payer: Self-pay | Admitting: *Deleted

## 2022-01-19 DIAGNOSIS — I4729 Other ventricular tachycardia: Secondary | ICD-10-CM

## 2022-01-19 NOTE — Telephone Encounter (Signed)
-----   Message from Theora Gianotti, NP sent at 01/17/2022  8:31 AM EST ----- Predominantly sinus tachycardia.  9 runs of ventricular tachycardia, though all generally brief.  Very fast at times however.  3 runs of fast heart beats from the top chambers, longest up to 18.4 secs.  Occasional extra beats from the bottom chambers (PVCs) accounting for 1.6% of all beats.  Triggered events were not associated with significant arrhythmias.  History of lightheaded spells and finding of NSVT are concerning and serve to make cMRI (sched for 12/20) and EP referral more necessary.  I do not see that EP appt has been scheduled yet.  I'd like to see this done before the end of the month.  Please also increase toprol xl to '25mg'$  BID.

## 2022-01-19 NOTE — Telephone Encounter (Signed)
No answer/Voicemail box is full.  

## 2022-01-26 ENCOUNTER — Telehealth: Payer: Self-pay | Admitting: Cardiovascular Disease

## 2022-01-26 MED ORDER — SPIRONOLACTONE 25 MG PO TABS: 25.0000 mg | ORAL_TABLET | Freq: Every day | ORAL | 0 refills | Status: DC

## 2022-01-26 NOTE — Telephone Encounter (Signed)
Requested Prescriptions   Signed Prescriptions Disp Refills   spironolactone (ALDACTONE) 25 MG tablet 90 tablet 0    Sig: Take 1 tablet (25 mg total) by mouth daily.    Authorizing Provider: Minna Merritts    Ordering User: Britt Bottom

## 2022-01-26 NOTE — Telephone Encounter (Signed)
 *  STAT* If patient is at the pharmacy, call can be transferred to refill team.   1. Which medications need to be refilled? (please list name of each medication and dose if known)   spironolactone (ALDACTONE) 25 MG tablet    2. Which pharmacy/location (including street and city if local pharmacy) is medication to be sent to? [facility] [place], [address] AT [facility] [address] [place]   3. Do they need a 30 day or 90 day supply? 90 days

## 2022-01-28 MED ORDER — METOPROLOL SUCCINATE ER 25 MG PO TB24: 25.0000 mg | ORAL_TABLET | Freq: Two times a day (BID) | ORAL | 3 refills | Status: DC

## 2022-01-28 NOTE — Telephone Encounter (Signed)
Reviewed results and recommendations. He confirmed upcoming appointment to have his CMRI. Will check on his EP appointment and advised we would be in touch with that. He verbalized understanding, agreeable with plan, and had no further questions at this time.

## 2022-01-28 NOTE — Telephone Encounter (Signed)
-----   Message from Theora Gianotti, NP sent at 01/17/2022  8:31 AM EST ----- Predominantly sinus tachycardia.  9 runs of ventricular tachycardia, though all generally brief.  Very fast at times however.  3 runs of fast heart beats from the top chambers, longest up to 18.4 secs.  Occasional extra beats from the bottom chambers (PVCs) accounting for 1.6% of all beats.  Triggered events were not associated with significant arrhythmias.  History of lightheaded spells and finding of NSVT are concerning and serve to make cMRI (sched for 12/20) and EP referral more necessary.  I do not see that EP appt has been scheduled yet.  I'd like to see this done before the end of the month.  Please also increase toprol xl to '25mg'$  BID.

## 2022-01-31 ENCOUNTER — Telehealth (HOSPITAL_COMMUNITY): Payer: Self-pay | Admitting: Emergency Medicine

## 2022-01-31 NOTE — Telephone Encounter (Signed)
Reaching out to patient to offer assistance regarding upcoming cardiac imaging study; pt verbalizes understanding of appt date/time, parking situation and where to check in, pre-test NPO status and medications ordered, and verified current allergies; name and call back number provided for further questions should they arise Willie Bond RN Navigator Cardiac Imaging [facility] [phone] office [phone] cell  Arrival 930 Holding diuretics Denies iv issues Denies metal implants Denies claustro

## 2022-02-01 ENCOUNTER — Other Ambulatory Visit: Payer: Managed Care, Other (non HMO)

## 2022-02-02 ENCOUNTER — Other Ambulatory Visit: Payer: Self-pay | Admitting: Nurse Practitioner

## 2022-02-02 ENCOUNTER — Ambulatory Visit
Admission: RE | Admit: 2022-02-02 | Discharge: 2022-02-02 | Disposition: A | Discharge status: Home / Self Care | Payer: Managed Care, Other (non HMO) | Source: Ambulatory Visit | Attending: Nurse Practitioner | Admitting: Nurse Practitioner

## 2022-02-02 DIAGNOSIS — I5022 Chronic systolic (congestive) heart failure: Secondary | ICD-10-CM | POA: Diagnosis not present

## 2022-02-02 DIAGNOSIS — I428 Other cardiomyopathies: Secondary | ICD-10-CM | POA: Diagnosis not present

## 2022-02-02 MED ORDER — GADOBUTROL 1 MMOL/ML IV SOLN
12.0000 mL | Freq: Once | INTRAVENOUS | Status: AC | PRN
  Administered 2022-02-02: 12 mL via INTRAVENOUS

## 2022-02-03 ENCOUNTER — Other Ambulatory Visit: Payer: Self-pay | Admitting: Nurse Practitioner

## 2022-02-03 ENCOUNTER — Telehealth: Payer: Self-pay | Admitting: Cardiovascular Disease

## 2022-02-03 DIAGNOSIS — Z20828 Contact with and (suspected) exposure to other viral communicable diseases: Secondary | ICD-10-CM

## 2022-02-03 DIAGNOSIS — J069 Acute upper respiratory infection, unspecified: Secondary | ICD-10-CM

## 2022-02-03 MED ORDER — OSELTAMIVIR PHOSPHATE 75 MG PO CAPS: 75.0000 mg | ORAL_CAPSULE | Freq: Two times a day (BID) | ORAL | 0 refills | Status: AC

## 2022-02-03 NOTE — Telephone Encounter (Signed)
No answer/Voicemail box is full.  

## 2022-02-03 NOTE — Telephone Encounter (Signed)
Duplicate encounter

## 2022-02-03 NOTE — Telephone Encounter (Signed)
-----   Message from Theora Gianotti, NP sent at 02/02/2022  4:00 PM EST ----- Persistently poor heart squeeze without evidence of infiltrative disease.  Continue current medications.  Pls refer to advanced CHF.  F/u with EP as planned.

## 2022-02-03 NOTE — Telephone Encounter (Signed)
Spoke with patient and reviewed results and recommendations. Confirmed appointment with EP and advised that I would have someone call from CHF clinic to get him scheduled to see them. He verbalized understanding and was agreeable with plan. No further questions at this time.

## 2022-02-03 NOTE — Progress Notes (Signed)
See telephone encounter. Results and recommendations reviewed with patient and appointments have been scheduled.

## 2022-02-03 NOTE — Telephone Encounter (Signed)
Patient is returning call to discuss MRI results. 

## 2022-02-03 NOTE — Telephone Encounter (Signed)
Called and scheduled patient to see Dr. Haroldine Laws in HF clinic here at [facility] for 02/23/22 at 11:20 am and later that day on 02/23/22 at 1:55 pm here in our office to see Ignacia Bayley NP. Patient verbalized understanding with no further questions at this time.

## 2022-02-23 ENCOUNTER — Encounter: Payer: Self-pay | Admitting: Internal Medicine

## 2022-02-23 ENCOUNTER — Ambulatory Visit: Payer: Managed Care, Other (non HMO) | Admitting: Nurse Practitioner

## 2022-02-23 ENCOUNTER — Ambulatory Visit: Payer: Managed Care, Other (non HMO) | Attending: Internal Medicine | Admitting: Internal Medicine

## 2022-02-23 VITALS — BP 115/86 | HR 93 | Resp 20 | Wt 214.5 lb

## 2022-02-23 DIAGNOSIS — G4733 Obstructive sleep apnea (adult) (pediatric): Secondary | ICD-10-CM

## 2022-02-23 DIAGNOSIS — I2510 Atherosclerotic heart disease of native coronary artery without angina pectoris: Secondary | ICD-10-CM | POA: Diagnosis not present

## 2022-02-23 DIAGNOSIS — I4729 Other ventricular tachycardia: Secondary | ICD-10-CM

## 2022-02-23 DIAGNOSIS — I5022 Chronic systolic (congestive) heart failure: Secondary | ICD-10-CM

## 2022-02-23 DIAGNOSIS — I420 Dilated cardiomyopathy: Secondary | ICD-10-CM

## 2022-02-23 MED ORDER — METOPROLOL SUCCINATE ER 25 MG PO TB24: ORAL_TABLET | ORAL | 3 refills | Status: DC

## 2022-02-23 NOTE — Addendum Note (Signed)
Addended by: Scarlette Calico on: 02/23/2022 12:13 PM   Modules accepted: Orders

## 2022-02-23 NOTE — Patient Instructions (Signed)
Medication Changes:  Increase Metoprolol to 25 mg (1 tab) in AM and 50 mg (2 tabs) in PM  Lab Work:  Labs are needed, we have provided you with a prescription to have these done at [facility]  Testing/Procedures:  none  Referrals:  You have been referred to Dr Broadus John for genetic testing, she is located at [facility] in [place], they will call you for an appointment  Special Instructions // Education:  Do the following things EVERYDAY: Weigh yourself in the morning before breakfast. Write it down and keep it in a log. Take your medicines as prescribed Eat low salt foods--Limit salt (sodium) to 2000 mg per day.  Stay as active as you can everyday Limit all fluids for the day to less than 2 liters   Follow-Up in: 2 months, we will call you closer to that time to schedule this appointment    If you have any questions or concerns before your next appointment please send [place] a message through mychart or call our office at [phone] Monday-Friday 8 am-5 pm.   If you have an urgent need after hours on the weekend please call your Primary Cardiologist or the [facility] in [place] at [phone].

## 2022-02-23 NOTE — Progress Notes (Signed)
ADVANCED HF CLINIC CONSULT NOTE  Referring Physician: Ignacia Bayley, NP Primary Care: Bo Merino, FNP Primary Cardiologist: Ida Rogue, MD   HPI:  Willie Hess is a 57 yo male with a history of nonobstructive CAD, nonischemic cardiomyopathy, chronic HFrEF (EF 20 to 25% 05-14-2021), hyperlipidemia, prediabetes, GERD, severe OSA (AHI 58 in 4/23), and prior tobacco abuse (quit 2019/05/15), who is referred for further evalaution of his HF.   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 ECG with LVH. +strong FHx of CAD and heart failure. Echo EF 20-25% with global HK. Cath showed mild, nonobstructive LAD disease and otherwise normal coronary arteries with elevated filling pressures (LVEDP of 40, wedge of 35), and normal cardiac output and index.  He was subsequently placed on guideline directed medical therapy and furosemide.  cMRI 12/23 LVEF 25% RVEF =51% no LGE or infiltrative process.   Zio 12/23L PVCs 1.6%. 9 runs NSVT   Feels ok. DOE on mild exertion. Edema well controlled. No CP. Had some trouble tolerating b-blocker increase. Wearing CPAP  Father had CHF/AF (NICM) died at 51 in May 14, 2001.  Paternal uncle with same issues.  Oldest sister with valve surgery in 60s  Review of Systems: [y] = yes, '[ ]'$  = no   General: Weight gain '[ ]'$ ; Weight loss '[ ]'$ ; Anorexia '[ ]'$ ; Fatigue Blue.Reese ]; Fever '[ ]'$ ; Chills '[ ]'$ ; Weakness '[ ]'$   Cardiac: Chest pain/pressure '[ ]'$ ; Resting SOB '[ ]'$ ; Exertional SOB [ y]; Orthopnea '[ ]'$ ; Pedal Edema '[ ]'$ ; Palpitations '[ ]'$ ; Syncope '[ ]'$ ; Presyncope '[ ]'$ ; Paroxysmal nocturnal dyspnea'[ ]'$   Pulmonary: Cough '[ ]'$ ; Wheezing'[ ]'$ ; Hemoptysis'[ ]'$ ; Sputum '[ ]'$ ; Snoring [y]  GI: Vomiting'[ ]'$ ; Dysphagia'[ ]'$ ; Melena'[ ]'$ ; Hematochezia '[ ]'$ ; Heartburn'[ ]'$ ; Abdominal pain '[ ]'$ ; Constipation '[ ]'$ ; Diarrhea '[ ]'$ ; BRBPR '[ ]'$   GU: Hematuria'[ ]'$ ; Dysuria '[ ]'$ ; Nocturia'[ ]'$   Vascular: Pain in legs with walking '[ ]'$ ; Pain in feet with lying flat  '[ ]'$ ; Non-healing sores '[ ]'$ ; Stroke '[ ]'$ ; TIA '[ ]'$ ; Slurred speech '[ ]'$ ;  Neuro: Headaches'[ ]'$ ; Vertigo'[ ]'$ ; Seizures'[ ]'$ ; Paresthesias'[ ]'$ ;Blurred vision '[ ]'$ ; Diplopia '[ ]'$ ; Vision changes '[ ]'$   Ortho/Skin: Arthritis [ y]; Joint pain Blue.Reese ]; Muscle pain '[ ]'$ ; Joint swelling '[ ]'$ ; Back Pain '[ ]'$ ; Rash '[ ]'$   Psych: Depression'[ ]'$ ; Anxiety'[ ]'$   Heme: Bleeding problems '[ ]'$ ; Clotting disorders '[ ]'$ ; Anemia '[ ]'$   Endocrine: Diabetes '[ ]'$ ; Thyroid dysfunction'[ ]'$    Past Medical History:  Diagnosis Date   Arthritis    Benign neoplasm of ascending colon    Benign neoplasm of cecum    Benign neoplasm of sigmoid colon    Benign neoplasm of transverse colon    Chronic HFrEF (heart failure with reduced ejection fraction) ([facility])    a. 05/14/2021 Echo: EF 20-25%.   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 05-14-21 Echo: EF 20-25%, glob HK, GrI DD, nl RV fxn, mod dil LA, mild MR, triv AI; b. 05/2021 Cath: nonobs dzs. LVEDP 40, RA 12, RV 50/12, PA 50/30, PCWP 35, CO/CI 4.5/2.0.   Nonobstructive  CAD (coronary artery disease)    a. 05/2021 Cath: LM nl, LAD 63m D1-3 nl, RI nl, LCX nl, OM1-3 nl, RCA nl, RPDA/PAV/PL1-3 nl.   Obstructive sleep apnea    Prediabetes 09/25/2017   Preventative health care 10/12/2015    Current Outpatient Medications  Medication Sig Dispense Refill   empagliflozin (JARDIANCE) 10 MG TABS tablet Take 1 tablet (10 mg total) by mouth daily before breakfast. 90 tablet 0   furosemide (LASIX) 40 MG tablet Take 1 tablet (40 mg total) by mouth 2 (two) times daily. 180 tablet 0   metoprolol succinate (TOPROL-XL) 25 MG 24 hr tablet Take 1 tablet (25 mg total) by mouth 2 (two) times daily. 180 tablet 3   Omeprazole-Sodium Bicarbonate (ZEGERID) 20-1100 MG CAPS capsule Take 2 capsules by mouth daily.       sacubitril-valsartan (ENTRESTO) 24-26 MG Take 1 tablet by mouth 2 (two) times daily. 180 tablet 3   spironolactone (ALDACTONE) 25 MG tablet Take 1 tablet (25 mg total) by mouth daily. 90 tablet 0   No current facility-administered medications for this visit.   Facility-Administered Medications Ordered in Other Visits  Medication Dose Route Frequency Provider Last Rate Last Admin   sodium chloride flush (NS) 0.9 % injection 3 mL  3 mL Intravenous Q12H Gollan, TKathlene November MD        Allergies  Allergen Reactions   Bee Venom Swelling      Social History   Socioeconomic History   Marital status: Married    Spouse name: Lauren   Number of children: 2   Years of education: Not on file   Highest education level: Not on file  Occupational History   Not on file  Tobacco Use   Smoking status: Former    Packs/day: 1.00    Years: 32.00    Total pack years: 32.00    Types: Cigarettes    Quit date: 2020    Years since quitting: 4.0   Smokeless tobacco: Never  Vaping Use   Vaping Use: Never used  Substance and Sexual Activity   Alcohol use: No   Drug use: No   Sexual activity: Yes    Partners: Female  Other Topics Concern   Not on file  Social History Narrative   Not on file   Social Determinants of Health   Financial Resource Strain: Medium Risk (03/19/2021)   Overall Financial Resource Strain (CARDIA)    Difficulty of Paying Living Expenses: Somewhat hard  Food Insecurity: Food Insecurity Present (03/19/2021)   Hunger Vital Sign    Worried About Running Out of Food in the Last Year: Often true    Ran Out of Food in the Last Year: Often true  Transportation Needs: No Transportation Needs (03/19/2021)   PRAPARE - THydrologist(Medical): No    Lack of Transportation (Non-Medical): No  Physical Activity: Insufficiently Active (03/19/2021)   Exercise Vital Sign    Days of Exercise per Week: 1 day    Minutes of Exercise per Session: 10 min  Stress:  Stress Concern Present (03/19/2021)   [place]   Feeling of Stress : To some extent  Social Connections: Socially Integrated (03/19/2021)   Social Connection and Isolation Panel [NHANES]    Frequency of Communication with Friends and Family: Three times a week    Frequency of Social Gatherings with Friends and Family: Twice a week    Attends Religious Services: 1  to 4 times per year    Active Member of Clubs or Organizations: Yes    Attends Archivist Meetings: More than 4 times per year    Marital Status: Married  Human resources officer Violence: Not At Risk (03/19/2021)   Humiliation, Afraid, Rape, and Kick questionnaire    Fear of Current or Ex-Partner: No    Emotionally Abused: No    Physically Abused: No    Sexually Abused: No      Family History  Problem Relation Age of Onset   Thyroid disease Mother    Heart disease Father    Heart disease Sister    Other Brother        Lyme Disease   Prostate cancer Brother    Heart disease Paternal Grandmother    Emphysema Paternal Grandfather     Vitals:   02/23/22 1111  BP: 115/86  Pulse: 93  Resp: 20  SpO2: 96%  Weight: 214 lb 8 oz (97.3 kg)    PHYSICAL EXAM: General:  Well appearing. No respiratory difficulty HEENT: normal Neck: supple. no JVD. Carotids 2+ bilat; no bruits. No lymphadenopathy or thryomegaly appreciated. Cor: PMI nondisplaced. Regular rate & rhythm. No rubs, gallops or murmurs. Lungs: clear Abdomen: soft, nontender, nondistended. No hepatosplenomegaly. No bruits or masses. Good bowel sounds. Extremities: no cyanosis, clubbing, rash, edema Neuro: alert & oriented x 3, cranial nerves grossly intact. moves all 4 extremities w/o difficulty. Affect pleasant.  ECG 12/10/21: NSR 91 nonspecific ST-T abnormalities QRS 44m Personally reviewed    ASSESSMENT & PLAN:  1.  Chronic HFrEF/NICM:   - Onset 3/23 ECHO EF 20-25% - Cath 3/23 mild  nonobstructive CAD - cMRI 12/23 LVEF 35% RVEF 51% no LGE or infiltrative process - Zio 12/23  1.6% PVCs + 9 runs NSVT - NYHA II-early III - Increase Toprol 25/50 - Continue Jardiance 10 - Continue Entresto 24/26 bid - Continue spiro 25 daily - Needs ICD. Has referral to EP - Dr. LQuentin Ore1/31 - Unclear etiology. Suspect potential familial CM. Refer to Dr. JBroadus John- Eventual CPX for risk stratification  - Labs today   2.  NSVT - has EP referral for ICD - continue b-blocker  3. Non-obstructive CAD - no s/s angina - continue ASA/statin   4. Severe OSA - AHI 58 on HST 4/23 - compliant with CPAP  DGlori Bickers MD  11:32 AM

## 2022-03-15 NOTE — Progress Notes (Unsigned)
Electrophysiology Office Note:    Date:  03/16/2022   ID:  Willie Hess, DOB 02/22/1965, MRN [id number]  PCP:  Pender, Julie F, FNP  [facility] Cardiologist:  Timothy Gollan, MD  [facility] Electrophysiologist:  Chudney Scheffler T Markhi Kleckner, MD   Referring MD: Berge, Christopher Rona*   Chief Complaint: Cardiomyopathy and PVCs  History of Present Illness:    Willie Hess is a 57 y.o. male who presents for an evaluation of cardiomyopathy and PVCs at the request of Willie Hess. Their medical history includes chronic systolic heart failure, GERD, PVCs.  The patient last saw Dr. Bensimhon on February 23, 2022.  His diagnosis of heart failure dates back to March 2023.  He has been on guideline directed medical therapy.  Cardiac MRI December 2023 showed an EF of 25% with no LGE.  He is referred to me today to discuss ICD implant.  Today he tells me he has a family history of heart disease that is not heart artery related.  His dad was diagnosed in his 50s ultimately passed away in his early 70s.  His uncle had heart disease as well and had a defibrillator implanted.  It sounds like his uncle died of an arrhythmic event.    Past Medical History:  Diagnosis Date   Arthritis    Benign neoplasm of ascending colon    Benign neoplasm of cecum    Benign neoplasm of sigmoid colon    Benign neoplasm of transverse colon    Chronic HFrEF (heart failure with reduced ejection fraction) ([facility])    a. 04/2021 Echo: EF 20-25%.   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 04/2021 Echo: EF 20-25%, glob HK, GrI DD, nl RV fxn, mod dil LA, mild MR, triv AI; b. 05/2021 Cath: nonobs dzs. LVEDP 40, RA 12, RV 50/12, PA 50/30, PCWP 35, CO/CI 4.5/2.0.   Nonobstructive CAD (coronary artery disease)    a. 05/2021 Cath: LM  nl, LAD 20m, D1-3 nl, RI nl, LCX nl, OM1-3 nl, RCA nl, RPDA/PAV/PL1-3 nl.   Obstructive sleep apnea    Prediabetes 09/25/2017   Preventative health care 10/12/2015    Past Surgical History:  Procedure Laterality Date   COLONOSCOPY WITH PROPOFOL N/A 11/12/2015   Procedure: COLONOSCOPY WITH PROPOFOL;  Surgeon: Willie Wohl, MD;  Location: [facility];  Service: Endoscopy;  Laterality: N/A;   COLONOSCOPY WITH PROPOFOL N/A 04/20/2021   Procedure: COLONOSCOPY WITH PROPOFOL;  Surgeon: Hess, Darren, MD;  Location: [facility];  Service: Endoscopy;  Laterality: N/A;   ESOPHAGOGASTRODUODENOSCOPY     INSERTION OF MESH Left 08/24/2017   Procedure: INSERTION OF MESH;  Surgeon: Pabon, Diego F, MD;  Location: [facility];  Service: General;  Laterality: Left;   POLYPECTOMY  11/12/2015   Procedure: POLYPECTOMY;  Surgeon: Willie Wohl, MD;  Location: [facility];  Service: Endoscopy;;   RIGHT/LEFT HEART CATH AND CORONARY ANGIOGRAPHY  05/25/2021   Procedure: RIGHT/LEFT HEART CATH AND CORONARY ANGIOGRAPHY;  Surgeon: End, Christopher, MD;  Location: [facility];  Service: Cardiovascular;;   ROBOT ASSISTED INGUINAL HERNIA REPAIR Left 08/24/2017   Procedure: ROBOT ASSISTED INGUINAL HERNIA REPAIR;  Surgeon: Pabon, Diego F, MD;  Location: [facility];  Service: General;    Laterality: Left;   URETEROSCOPY WITH HOLMIUM LASER LITHOTRIPSY Left 09/23/2014   Procedure: URETEROSCOPY, CYSTOSCOPY WITH STENT PLACEMENT, HOLMIUM STAND BY ;  Surgeon: Willie R Wolff, MD;  Location: [facility];  Service: Urology;  Laterality: Left;   VASECTOMY     WISDOM TOOTH EXTRACTION      Current Medications: Current Meds  Medication Sig   empagliflozin (JARDIANCE) 10 MG TABS tablet Take 1 tablet (10 mg total) by mouth daily before breakfast.   furosemide (LASIX) 40 MG tablet Take 1 tablet (40 mg total) by mouth 2 (two) times daily.   metoprolol succinate (TOPROL-XL) 25 MG 24 hr tablet Take 1 tablet (25 mg total) by mouth in  the morning AND 2 tablets (50 mg total) every evening.   Omeprazole-Sodium Bicarbonate (ZEGERID) 20-1100 MG CAPS capsule Take 2 capsules by mouth daily.    sacubitril-valsartan (ENTRESTO) 24-26 MG Take 1 tablet by mouth 2 (two) times daily.   spironolactone (ALDACTONE) 25 MG tablet Take 1 tablet (25 mg total) by mouth daily.     Allergies:   Bee venom   Social History   Socioeconomic History   Marital status: Married    Spouse name: Lauren   Number of children: 2   Years of education: Not on file   Highest education level: Not on file  Occupational History   Not on file  Tobacco Use   Smoking status: Former    Packs/day: 1.00    Years: 32.00    Total pack years: 32.00    Types: Cigarettes    Quit date: 2020    Years since quitting: 4.0   Smokeless tobacco: Never  Vaping Use   Vaping Use: Never used  Substance and Sexual Activity   Alcohol use: No   Drug use: No   Sexual activity: Yes    Partners: Female  Other Topics Concern   Not on file  Social History Narrative   Not on file   Social Determinants of Health   Financial Resource Strain: Medium Risk (03/19/2021)   Overall Financial Resource Strain (CARDIA)    Difficulty of Paying Living Expenses: Somewhat hard  Food Insecurity: Food Insecurity Present (03/19/2021)   Hunger Vital Sign    Worried About Running Out of Food in the Last Year: Often true    Ran Out of Food in the Last Year: Often true  Transportation Needs: No Transportation Needs (03/19/2021)   PRAPARE - Transportation    Lack of Transportation (Medical): No    Lack of Transportation (Non-Medical): No  Physical Activity: Insufficiently Active (03/19/2021)   Exercise Vital Sign    Days of Exercise per Week: 1 day    Minutes of Exercise per Session: 10 min  Stress: Stress Concern Present (03/19/2021)   [employer] - Occupational Stress Questionnaire    Feeling of Stress : To some extent  Social Connections: Socially Integrated  (03/19/2021)   Social Connection and Isolation Panel [NHANES]    Frequency of Communication with Friends and Family: Three times a week    Frequency of Social Gatherings with Friends and Family: Twice a week    Attends Religious Services: 1 to 4 times per year    Active Member of Clubs or Organizations: Yes    Attends Club or Organization Meetings: More than 4 times per year    Marital Status: Married     Family History: The patient's family history includes Emphysema in his paternal grandfather; Heart disease in his father, paternal grandmother, and   sister; Other in his brother; Prostate cancer in his brother; Thyroid disease in his mother.  ROS:   Please see the history of present illness.    All other systems reviewed and are negative.  EKGs/Labs/Other Studies Reviewed:    The following studies were reviewed today:  December 10, 2021 EKG shows sinus rhythm with a narrow QRS  February 02, 2022 cardiac MRI shows an EF of 25%, no LGE, no infiltrative disease, normal RV    Recent Labs: 11/19/2021: BNP 77.4 11/24/2021: ALT 25; BUN 12; Creatinine, Ser 1.11; Hemoglobin 15.8; Platelets 210; Potassium 4.0; Sodium 136  Recent Lipid Panel    Component Value Date/Time   CHOL 225 (H) 03/19/2021 0948   TRIG 160 (H) 03/19/2021 0948   HDL 61 03/19/2021 0948   CHOLHDL 3.7 03/19/2021 0948   LDLCALC 136 (H) 03/19/2021 0948    Physical Exam:    VS:  BP 124/70   Pulse 96   Ht 6' 2" (1.88 m)   Wt 214 lb (97.1 kg)   SpO2 97%   BMI 27.48 kg/m     Wt Readings from Last 3 Encounters:  03/16/22 214 lb (97.1 kg)  02/23/22 214 lb 8 oz (97.3 kg)  12/10/21 211 lb (95.7 kg)     GEN:  Well nourished, well developed in no acute distress CARDIAC: RRR, no murmurs, rubs, gallops RESPIRATORY:  Clear to auscultation without rales, wheezing or rhonchi       ASSESSMENT:    1. HFrEF (heart failure with reduced ejection fraction) ([facility])   2. NSVT (nonsustained ventricular tachycardia) ([facility])     PLAN:    In order of problems listed above:   #Chronic systolic heart failure NYHA II-III.  Follows with Dr. Bensimhon.  EF 25%.  On guideline directed medical therapy.  The patient has an non ischemic CM (EF 25%), NYHA Class III CHF, and CAD.  He is referred by Dr. Bensimhon for risk stratification of sudden death and consideration of ICD implantation.  At this time, he meets criteria for ICD implantation for primary prevention of sudden death.  I have had a thorough discussion with the patient reviewing options.  The patient and their family (if available) have had opportunities to ask questions and have them answered. The patient and I have decided together through a shared decision making process to proceed with ICD implant at this time.    Risks, benefits, alternatives to ICD implantation were discussed in detail with the patient today. The patient understands that the risks include but are not limited to bleeding, infection, pneumothorax, perforation, tamponade, vascular damage, renal failure, MI, stroke, death, inappropriate shocks, and lead dislodgement and wishes to proceed.  We will therefore schedule device implantation at the next available time.  Plan for [employer] VVI ICD.   #PVCs Infrequent.  Continue beta-blocker.       Medication Adjustments/Labs and Tests Ordered: Current medicines are reviewed at length with the patient today.  Concerns regarding medicines are outlined above.  No orders of the defined types were placed in this encounter.  No orders of the defined types were placed in this encounter.    Signed, Hidaya Daniel T. Lovella Hardie, MD, FACC, FHRS 03/16/2022 10:56 AM    Electrophysiology [facility] 

## 2022-03-15 NOTE — H&P (View-Only) (Signed)
Electrophysiology Office Note:    Date:  03/16/2022   ID:  IZIC HEYD, DOB [birth year], MRN [id number]  PCP:  Bo Merino, FNP  [facility] Cardiologist:  Ida Rogue, MD  [facility] Electrophysiologist:  Vickie Epley, MD   Referring MD: Wilder Glade*   Chief Complaint: Cardiomyopathy and PVCs  History of Present Illness:    Willie Hess is a 57 y.o. male who presents for an evaluation of cardiomyopathy and PVCs at the request of [name]. Their medical history includes chronic systolic heart failure, GERD, PVCs.  The patient last saw Dr. Haroldine Laws on February 23, 2022.  His diagnosis of heart failure dates back to March 2023.  He has been on guideline directed medical therapy.  Cardiac MRI December 2023 showed an EF of 25% with no LGE.  He is referred to me today to discuss ICD implant.  Today he tells me he has a family history of heart disease that is not heart artery related.  His dad was diagnosed in his 44s ultimately passed away in his early 86s.  His uncle had heart disease as well and had a defibrillator implanted.  It sounds like his uncle died of an arrhythmic event.    Past Medical History:  Diagnosis Date   Arthritis    Benign neoplasm of ascending colon    Benign neoplasm of cecum    Benign neoplasm of sigmoid colon    Benign neoplasm of transverse colon    Chronic HFrEF (heart failure with reduced ejection fraction) ([place])    a. 04/2021 Echo: EF 20-25%.   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 04/2021 Echo: EF 20-25%, glob HK, GrI DD, nl RV fxn, mod dil LA, mild MR, triv AI; b. 05/2021 Cath: nonobs dzs. LVEDP 40, RA 12, RV 50/12, PA 50/30, PCWP 35, CO/CI 4.5/2.0.   Nonobstructive CAD (coronary artery disease)    a. 05/2021 Cath: LM  nl, LAD 95m D1-3 nl, RI nl, LCX nl, OM1-3 nl, RCA nl, RPDA/PAV/PL1-3 nl.   Obstructive sleep apnea    Prediabetes 09/25/2017   Preventative health care 10/12/2015    Past Surgical History:  Procedure Laterality Date   COLONOSCOPY WITH PROPOFOL N/A 11/12/2015   Procedure: COLONOSCOPY WITH PROPOFOL;  Surgeon: DLucilla Lame MD;  [facility]  Service: Endoscopy;  Laterality: N/A;   COLONOSCOPY WITH PROPOFOL N/A 04/20/2021   Procedure: COLONOSCOPY WITH PROPOFOL;  Surgeon: WLucilla Lame MD;  [facility];  Service: Endoscopy;  Laterality: N/A;   ESOPHAGOGASTRODUODENOSCOPY     INSERTION OF MESH Left 08/24/2017   Procedure: INSERTION OF MESH;  Surgeon: PJules Husbands MD;  Location: [facility];  Service: General;  Laterality: Left;   POLYPECTOMY  11/12/2015   Procedure: POLYPECTOMY;  Surgeon: DLucilla Lame MD;  [facility]  Service: Endoscopy;;   RIGHT/LEFT HEART CATH AND CORONARY ANGIOGRAPHY  05/25/2021   Procedure: RIGHT/LEFT HEART CATH AND CORONARY ANGIOGRAPHY;  Surgeon: ENelva Bush MD;  [facility];  Service: Cardiovascular;;   ROBOT ASSISTED INGUINAL HERNIA REPAIR Left 08/24/2017   Procedure: ROBOT ASSISTED INGUINAL HERNIA REPAIR;  Surgeon: PJules Husbands MD;  Location: [facility];  Service: General;  Laterality: Left;   URETEROSCOPY WITH HOLMIUM LASER LITHOTRIPSY Left 09/23/2014   Procedure: URETEROSCOPY, CYSTOSCOPY WITH STENT PLACEMENT, HOLMIUM STAND BY ;  Surgeon: Royston Cowper, MD;  Location: [facility];  Service: Urology;  Laterality: Left;   VASECTOMY     WISDOM TOOTH EXTRACTION      Current Medications: Current Meds  Medication Sig   empagliflozin (JARDIANCE) 10 MG TABS tablet Take 1 tablet (10 mg total) by mouth daily before breakfast.   furosemide (LASIX) 40 MG tablet Take 1 tablet (40 mg total) by mouth 2 (two) times daily.   metoprolol succinate (TOPROL-XL) 25 MG 24 hr tablet Take 1 tablet (25 mg total) by mouth in  the morning AND 2 tablets (50 mg total) every evening.   Omeprazole-Sodium Bicarbonate (ZEGERID) 20-1100 MG CAPS capsule Take 2 capsules by mouth daily.    sacubitril-valsartan (ENTRESTO) 24-26 MG Take 1 tablet by mouth 2 (two) times daily.   spironolactone (ALDACTONE) 25 MG tablet Take 1 tablet (25 mg total) by mouth daily.     Allergies:   Bee venom   Social History   Socioeconomic History   Marital status: Married    Spouse name: Lauren   Number of children: 2   Years of education: Not on file   Highest education level: Not on file  Occupational History   Not on file  Tobacco Use   Smoking status: Former    Packs/day: 1.00    Years: 32.00    Total pack years: 32.00    Types: Cigarettes    Quit date: 2020    Years since quitting: 4.0   Smokeless tobacco: Never  Vaping Use   Vaping Use: Never used  Substance and Sexual Activity   Alcohol use: No   Drug use: No   Sexual activity: Yes    Partners: Female  Other Topics Concern   Not on file  Social History Narrative   Not on file   Social Determinants of Health   Financial Resource Strain: Medium Risk (03/19/2021)   Overall Financial Resource Strain (CARDIA)    Difficulty of Paying Living Expenses: Somewhat hard  Food Insecurity: Food Insecurity Present (03/19/2021)   Hunger Vital Sign    Worried About Running Out of Food in the Last Year: Often true    Ran Out of Food in the Last Year: Often true  Transportation Needs: No Transportation Needs (03/19/2021)   PRAPARE - Hydrologist (Medical): No    Lack of Transportation (Non-Medical): No  Physical Activity: Insufficiently Active (03/19/2021)   Exercise Vital Sign    Days of Exercise per Week: 1 day    Minutes of Exercise per Session: 10 min  Stress: Stress Concern Present (03/19/2021)   [place]    Feeling of Stress : To some extent  Social Connections: Socially Integrated  (03/19/2021)   Social Connection and Isolation Panel [NHANES]    Frequency of Communication with Friends and Family: Three times a week    Frequency of Social Gatherings with Friends and Family: Twice a week    Attends Religious Services: 1 to 4 times per year    Active Member of [employer] or Organizations: Yes    Attends Music therapist: More than 4 times per year    Marital Status: Married     Family History: The patient's family history includes Emphysema in his paternal grandfather; Heart disease in his father, paternal grandmother, and  sister; Other in his brother; Prostate cancer in his brother; Thyroid disease in his mother.  ROS:   Please see the history of present illness.    All other systems reviewed and are negative.  EKGs/Labs/Other Studies Reviewed:    The following studies were reviewed today:  December 10, 2021 EKG shows sinus rhythm with a narrow QRS  February 02, 2022 cardiac MRI shows an EF of 25%, no LGE, no infiltrative disease, normal RV    Recent Labs: 11/19/2021: BNP 77.4 11/24/2021: ALT 25; BUN 12; Creatinine, Ser 1.11; Hemoglobin 15.8; Platelets 210; Potassium 4.0; Sodium 136  Recent Lipid Panel    Component Value Date/Time   CHOL 225 (H) 03/19/2021 0948   TRIG 160 (H) 03/19/2021 0948   HDL 61 03/19/2021 0948   CHOLHDL 3.7 03/19/2021 0948   LDLCALC 136 (H) 03/19/2021 0948    Physical Exam:    VS:  BP 124/70   Pulse 96   Ht 6' 2"$  (1.88 m)   Wt 214 lb (97.1 kg)   SpO2 97%   BMI 27.48 kg/m     Wt Readings from Last 3 Encounters:  03/16/22 214 lb (97.1 kg)  02/23/22 214 lb 8 oz (97.3 kg)  12/10/21 211 lb (95.7 kg)     GEN:  Well nourished, well developed in no acute distress CARDIAC: RRR, no murmurs, rubs, gallops RESPIRATORY:  Clear to auscultation without rales, wheezing or rhonchi       ASSESSMENT:    1. HFrEF (heart failure with reduced ejection fraction) ([place])   2. NSVT (nonsustained ventricular tachycardia) ([facility])     PLAN:    In order of problems listed above:   #Chronic systolic heart failure NYHA II-III.  Follows with Dr. Haroldine Laws.  EF 25%.  On guideline directed medical therapy.  The patient has an non ischemic CM (EF 25%), NYHA Class III CHF, and CAD.  He is referred by Dr. Haroldine Laws for risk stratification of sudden death and consideration of ICD implantation.  At this time, he meets criteria for ICD implantation for primary prevention of sudden death.  I have had a thorough discussion with the patient reviewing options.  The patient and their family (if available) have had opportunities to ask questions and have them answered. The patient and I have decided together through a shared decision making process to proceed with ICD implant at this time.    Risks, benefits, alternatives to ICD implantation were discussed in detail with the patient today. The patient understands that the risks include but are not limited to bleeding, infection, pneumothorax, perforation, tamponade, vascular damage, renal failure, MI, stroke, death, inappropriate shocks, and lead dislodgement and wishes to proceed.  We will therefore schedule device implantation at the next available time.  Plan for [place] ICD.   #PVCs Infrequent.  Continue beta-blocker.       Medication Adjustments/Labs and Tests Ordered: Current medicines are reviewed at length with the patient today.  Concerns regarding medicines are outlined above.  No orders of the defined types were placed in this encounter.  No orders of the defined types were placed in this encounter.    Signed, Hilton Cork. Quentin Ore, MD, [facility], [facility] 03/16/2022 10:56 AM    Electrophysiology [facility]

## 2022-03-16 ENCOUNTER — Other Ambulatory Visit: Payer: Self-pay

## 2022-03-16 ENCOUNTER — Ambulatory Visit: Payer: Managed Care, Other (non HMO) | Attending: Cardiology | Admitting: Cardiology

## 2022-03-16 ENCOUNTER — Encounter: Payer: Self-pay | Admitting: Cardiology

## 2022-03-16 VITALS — BP 124/70 | HR 96 | Ht 74.0 in | Wt 214.0 lb

## 2022-03-16 DIAGNOSIS — I4729 Other ventricular tachycardia: Secondary | ICD-10-CM

## 2022-03-16 DIAGNOSIS — I5020 Unspecified systolic (congestive) heart failure: Secondary | ICD-10-CM

## 2022-03-16 NOTE — Addendum Note (Signed)
Addended by: Bernestine Amass on: 03/16/2022 11:26 AM   Modules accepted: Orders

## 2022-03-16 NOTE — Addendum Note (Signed)
Addended by: Bernestine Amass on: 03/16/2022 11:25 AM   Modules accepted: Orders

## 2022-03-16 NOTE — Patient Instructions (Signed)
Medication Instructions:  Your physician recommends that you continue on your current medications as directed. Please refer to the Current Medication list given to you today.  *If you need a refill on your cardiac medications before your next appointment, please call your pharmacy*  Lab Work: BMET and CBC prior to your procedure.  You will get your lab work at [facility] hospital.  Your lab work will be done at the [facility] next to the [employer]. These are walk in labs- you will not need an appointment and you do not need to be fasting.    Testing/Procedures: Your physician has recommended that you have a defibrillator inserted. An implantable cardioverter defibrillator (ICD) is a small device that is placed in your chest or, in rare cases, your abdomen. This device uses electrical pulses or shocks to help control life-threatening, irregular heartbeats that could lead the heart to suddenly stop beating (sudden cardiac arrest). Leads are attached to the ICD that goes into your heart. This is done in the hospital and usually requires an overnight stay. Please see the instruction sheet given to you today for more information.  Follow-Up: At [facility], you and your health needs are our priority.  As part of our continuing mission to provide you with exceptional heart care, we have created designated Provider Care Teams.  These Care Teams include your primary Cardiologist (physician) and Advanced Practice Providers (APPs -  Physician Assistants and Nurse Practitioners) who all work together to provide you with the care you need, when you need it.  Your next appointment:   You are scheduled for an ICD implant with Dr. Quentin Ore on February 21st.  Follow up for wound check 10-14 days after.  Follow up with Dr. Quentin Ore 91 days after.

## 2022-03-22 ENCOUNTER — Other Ambulatory Visit: Payer: Self-pay

## 2022-03-22 ENCOUNTER — Telehealth: Payer: Self-pay | Admitting: Cardiovascular Disease

## 2022-03-22 MED ORDER — EMPAGLIFLOZIN 10 MG PO TABS: 10.0000 mg | ORAL_TABLET | Freq: Every day | ORAL | 0 refills | Status: DC

## 2022-03-22 NOTE — Telephone Encounter (Signed)
*  STAT* If patient is at the pharmacy, call can be transferred to refill team.   1. Which medications need to be refilled? (please list name of each medication and dose if known)   empagliflozin (JARDIANCE) 10 MG TABS tablet   2. Which pharmacy/location (including street and city if local pharmacy) is medication to be sent to?  [facility] [place], [address] AT [facility] OF [address]   3. Do they need a 30 day or 90 day supply?   90 day  Wife stated the patient only has 2 tablets left.

## 2022-03-22 NOTE — Telephone Encounter (Signed)
Requested Prescriptions   Signed Prescriptions Disp Refills   empagliflozin (JARDIANCE) 10 MG TABS tablet 90 tablet 0    Sig: Take 1 tablet (10 mg total) by mouth daily before breakfast.    Authorizing Provider: Minna Merritts    Ordering User: Raelene Bott, Mima Cranmore L

## 2022-03-22 NOTE — Telephone Encounter (Signed)
Please schedule overdue 1 month F/U appointment with Dr. Rockey Situ. Thank you!

## 2022-03-22 NOTE — Telephone Encounter (Signed)
Pt scheduled on 3/18

## 2022-03-24 NOTE — Progress Notes (Signed)
Name: Willie Hess   MRN: [id number]    DOB: 1965/04/28   Date:03/25/2022       Progress Note  Subjective  Chief Complaint  Chief Complaint  Patient presents with   Annual Exam    HPI  Patient presents for annual CPE.  IPSS Questionnaire (AUA-7): Over the past month.   1)  How often have you had a sensation of not emptying your bladder completely after you finish urinating?  0 - Not at all  2)  How often have you had to urinate again less than two hours after you finished urinating? 0 - Not at all  3)  How often have you found you stopped and started again several times when you urinated?  0 - Not at all  4) How difficult have you found it to postpone urination?  0 - Not at all  5) How often have you had a weak urinary stream?  0 - Not at all  6) How often have you had to push or strain to begin urination?  0 - Not at all  7) How many times did you most typically get up to urinate from the time you went to bed until the time you got up in the morning?  0 - None  Total score:  0-7 mildly symptomatic   8-19 moderately symptomatic   20-35 severely symptomatic     Diet: he has cut out sugar from his diet, he is trying to work on sodium, he tries to eat well balanced, occasionally eats fast food Exercise: he plays drums every weekend Sleep: 6 hours Last dental exam:he goes every 6 months Last eye exam: has been a while, maybe 2 years  Depression: phq 9 is negative    03/25/2022    8:33 AM 11/24/2021    7:58 AM 11/22/2021    9:43 AM 03/19/2021    8:42 AM 09/22/2020    8:28 AM  Depression screen PHQ 2/9  Decreased Interest 0 0 0 1 0  Down, Depressed, Hopeless 0 0 0 0 0  PHQ - 2 Score 0 0 0 1 0  Altered sleeping    0 0  Tired, decreased energy    0 0  Change in appetite    0 0  Feeling bad or failure about yourself     0 0  Trouble concentrating    0 0  Moving slowly or fidgety/restless    0 0  Suicidal thoughts    0 0  PHQ-9 Score    1 0  Difficult doing work/chores    Not  difficult at all Not difficult at all    Hypertension:  BP Readings from Last 3 Encounters:  03/25/22 118/76  03/16/22 124/70  02/23/22 115/86    Obesity: Wt Readings from Last 3 Encounters:  03/25/22 214 lb 4.8 oz (97.2 kg)  03/16/22 214 lb (97.1 kg)  02/23/22 214 lb 8 oz (97.3 kg)   BMI Readings from Last 3 Encounters:  03/25/22 27.51 kg/m  03/16/22 27.48 kg/m  02/23/22 27.54 kg/m     Lipids:  Lab Results  Component Value Date   CHOL 225 (H) 03/19/2021   CHOL 248 (H) 04/09/2020   CHOL 232 (H) 10/03/2018   Lab Results  Component Value Date   HDL 61 03/19/2021   HDL 58 04/09/2020   HDL 43 10/03/2018   Lab Results  Component Value Date   LDLCALC 136 (H) 03/19/2021   LDLCALC 153 (H) 04/09/2020  LDLCALC 156 (H) 10/03/2018   Lab Results  Component Value Date   TRIG 160 (H) 03/19/2021   TRIG 207 (H) 04/09/2020   TRIG 166 (H) 10/03/2018   Lab Results  Component Value Date   CHOLHDL 3.7 03/19/2021   CHOLHDL 4.3 04/09/2020   CHOLHDL 5.4 (H) 10/03/2018   No results found for: "LDLDIRECT" Glucose:  Glucose  Date Value Ref Range Status  11/19/2021 97 70 - 99 mg/dL Final  08/18/2021 100 (H) 70 - 99 mg/dL Final  05/18/2021 102 (H) 70 - 99 mg/dL Final   Glucose, Bld  Date Value Ref Range Status  11/24/2021 142 (H) 70 - 99 mg/dL Final    Comment:    Glucose reference range applies only to samples taken after fasting for at least 8 hours.  09/16/2014 132 (H) 65 - 99 mg/dL Final  09/14/2014 146 (H) 65 - 99 mg/dL Final    [place] Office Visit from 03/25/2022 in [facility]  AUDIT-C Score 0        Married STD testing and prevention (HIV/chl/gon/syphilis): 10/03/2018 Hep C: 10/03/2018  Skin cancer: Discussed monitoring for atypical lesions Colorectal cancer: 04/20/2021 Prostate cancer: will order No results found for: "PSA"   Lung cancer:   Low Dose CT Chest recommended if Age 75-80 years, 30 pack-year currently smoking  OR have quit w/in 15years. Patient does qualify.  He is scheduled on 04/18/2022 AAA:  The USPSTF recommends one-time screening with ultrasonography in men ages 100 to 52 years who have ever smoked ECG:  12/10/2021  Vaccines:  HPV: up to at age 110 , ask insurance if age between 58-45  Shingrix: 67-64 yo and ask insurance if covered when patient above 80 yo Pneumonia:  educated and discussed with patient. Flu:  educated and discussed with patient.  Advanced Care Planning: A voluntary discussion about advance care planning including the explanation and discussion of advance directives.  Discussed health care proxy and Living will, and the patient was able to identify a health care proxy as [name].  Patient does not have a living will at present time. If patient does have living will, I have requested they bring this to the clinic to be scanned in to their chart.  Patient Active Problem List   Diagnosis Date Noted   OSA (obstructive sleep apnea) 06/15/2021   HFrEF (heart failure with reduced ejection fraction) ([place]) 05/25/2021   Excessive daytime sleepiness 04/29/2021   History of colonic polyps    Polyp of transverse colon    Hyperlipidemia 04/28/2020   Prediabetes 09/25/2017   Hx of smoking 07/27/2017   Benign neoplasm of cecum    Benign neoplasm of ascending colon    Benign neoplasm of transverse colon    Benign neoplasm of sigmoid colon    GERD without esophagitis 12/11/2014    Past Surgical History:  Procedure Laterality Date   COLONOSCOPY WITH PROPOFOL N/A 11/12/2015   Procedure: COLONOSCOPY WITH PROPOFOL;  Surgeon: Lucilla Lame, MD;  Location: [place];  Service: Endoscopy;  Laterality: N/A;   COLONOSCOPY WITH PROPOFOL N/A 04/20/2021   Procedure: COLONOSCOPY WITH PROPOFOL;  Surgeon: Lucilla Lame, MD;  Location: [facility] ENDOSCOPY;  Service: Endoscopy;  Laterality: N/A;   ESOPHAGOGASTRODUODENOSCOPY     INSERTION OF MESH Left 08/24/2017   Procedure: INSERTION OF MESH;   Surgeon: Jules Husbands, MD;  Location: [facility] ORS;  Service: General;  Laterality: Left;   POLYPECTOMY  11/12/2015   Procedure: POLYPECTOMY;  Surgeon: Lucilla Lame,  MD;  Location: [place];  Service: Endoscopy;;   RIGHT/LEFT HEART CATH AND CORONARY ANGIOGRAPHY  05/25/2021   Procedure: RIGHT/LEFT HEART CATH AND CORONARY ANGIOGRAPHY;  Surgeon: Nelva Bush, MD;  Location: [place] CV LAB;  Service: Cardiovascular;;   ROBOT ASSISTED INGUINAL HERNIA REPAIR Left 08/24/2017   Procedure: ROBOT ASSISTED INGUINAL HERNIA REPAIR;  Surgeon: Jules Husbands, MD;  Location: [facility] ORS;  Service: General;  Laterality: Left;   URETEROSCOPY WITH HOLMIUM LASER LITHOTRIPSY Left 09/23/2014   Procedure: URETEROSCOPY, CYSTOSCOPY WITH STENT PLACEMENT, HOLMIUM STAND BY ;  Surgeon: Royston Cowper, MD;  Location: [facility];  Service: Urology;  Laterality: Left;   VASECTOMY     WISDOM TOOTH EXTRACTION      Family History  Problem Relation Age of Onset   Thyroid disease Mother    Heart disease Father    Heart disease Sister    Other Brother        Lyme Disease   Prostate cancer Brother    Heart disease Paternal Grandmother    Emphysema Paternal Grandfather     Social History   Socioeconomic History   Marital status: Married    Spouse name: Lauren   Number of children: 2   Years of education: Not on file   Highest education level: Not on file  Occupational History   Not on file  Tobacco Use   Smoking status: Former    Packs/day: 1.00    Years: 32.00    Total pack years: 32.00    Types: Cigarettes    Quit date: 2020    Years since quitting: 4.1   Smokeless tobacco: Never  Vaping Use   Vaping Use: Never used  Substance and Sexual Activity   Alcohol use: No   Drug use: No   Sexual activity: Yes    Partners: Female  Other Topics Concern   Not on file  Social History Narrative   Works at [employer] Strain: High Risk (03/25/2022)    Overall Financial Resource Strain (CARDIA)    Difficulty of Paying Living Expenses: Very hard  Food Insecurity: No Food Insecurity (03/25/2022)   Hunger Vital Sign    Worried About Running Out of Food in the Last Year: Never true    [place] in the Last Year: Never true  Transportation Needs: No Transportation Needs (03/25/2022)   PRAPARE - Hydrologist (Medical): No    Lack of Transportation (Non-Medical): No  Physical Activity: Insufficiently Active (03/25/2022)   Exercise Vital Sign    Days of Exercise per Week: 1 day    Minutes of Exercise per Session: 30 min  Stress: Stress Concern Present (03/19/2021)   [place]    Feeling of Stress : To some extent  Social Connections: Moderately Integrated (03/25/2022)   Social Connection and Isolation Panel [NHANES]    Frequency of Communication with Friends and Family: More than three times a week    Frequency of Social Gatherings with Friends and Family: More than three times a week    Attends Religious Services: 1 to 4 times per year    Active Member of [employer] or Organizations: No    Attends Archivist Meetings: Never    Marital Status: Married  Human resources officer Violence: Not At Risk (03/25/2022)   Humiliation, Afraid, Rape, and Kick questionnaire    Fear of Current  or Ex-Partner: No    Emotionally Abused: No    Physically Abused: No    Sexually Abused: No     Current Outpatient Medications:    empagliflozin (JARDIANCE) 10 MG TABS tablet, Take 1 tablet (10 mg total) by mouth daily before breakfast., Disp: 90 tablet, Rfl: 0   furosemide (LASIX) 40 MG tablet, Take 1 tablet (40 mg total) by mouth 2 (two) times daily., Disp: 180 tablet, Rfl: 0   metoprolol succinate (TOPROL-XL) 25 MG 24 hr tablet, Take 1 tablet (25 mg total) by mouth in the morning AND 2 tablets (50 mg total) every evening., Disp: 90 tablet, Rfl: 3   Omeprazole-Sodium  Bicarbonate (ZEGERID) 20-1100 MG CAPS capsule, Take 2 capsules by mouth daily. , Disp: , Rfl:    sacubitril-valsartan (ENTRESTO) 24-26 MG, Take 1 tablet by mouth 2 (two) times daily., Disp: 180 tablet, Rfl: 3   spironolactone (ALDACTONE) 25 MG tablet, Take 1 tablet (25 mg total) by mouth daily., Disp: 90 tablet, Rfl: 0  Allergies  Allergen Reactions   Bee Venom Swelling     ROS  Constitutional: Negative for fever or weight change.  Respiratory: Negative for cough and shortness of breath.   Cardiovascular: Negative for chest pain or palpitations.  Gastrointestinal: Negative for abdominal pain, no bowel changes.  Musculoskeletal: Negative for gait problem or joint swelling.  Skin: Negative for rash.  Neurological: Negative for dizziness or headache.  No other specific complaints in a complete review of systems (except as listed in HPI above).    Objective  Vitals:   03/25/22 0825  BP: 118/76  Pulse: 100  Resp: 18  Temp: 97.7 F (36.5 C)  TempSrc: Oral  SpO2: 99%  Weight: 214 lb 4.8 oz (97.2 kg)  Height: 6' 2"$  (1.88 m)    Body mass index is 27.51 kg/m.  Physical Exam Constitutional: Patient appears well-developed and well-nourished. No distress.  HENT: Head: Normocephalic and atraumatic. Ears: B TMs ok, no erythema or effusion; Nose: Nose normal. Mouth/Throat: Oropharynx is clear and moist. No oropharyngeal exudate.  Eyes: Conjunctivae and EOM are normal. Pupils are equal, round, and reactive to light. No scleral icterus.  Neck: Normal range of motion. Neck supple. No JVD present. No thyromegaly present.  Cardiovascular: Normal rate, regular rhythm and normal heart sounds.  No murmur heard. No BLE edema. Pulmonary/Chest: Effort normal and breath sounds normal. No respiratory distress. Abdominal: Soft. Bowel sounds are normal, no distension. There is no tenderness. no masses Musculoskeletal: Normal range of motion, no joint effusions. No gross deformities Neurological: he  is alert and oriented to person, place, and time. No cranial nerve deficit. Coordination, balance, strength, speech and gait are normal.  Skin: Skin is warm and dry. No rash noted. No erythema.  Psychiatric: Patient has a normal mood and affect. behavior is normal. Judgment and thought content normal.   Recent Results (from the past 2160 hour(s))  ECHOCARDIOGRAM COMPLETE     Status: None   Collection Time: 01/03/22  9:53 AM  Result Value Ref Range   Ao pk vel 1.59 m/s   AV Area VTI 2.23 cm2   AR max vel 2.29 cm2   AV Mean grad 7.0 mmHg   AV Peak grad 10.1 mmHg   Single Plane A2C EF 33.3 %   Single Plane A4C EF 35.5 %   Calc EF 32.4 %   S' Lateral 5.10 cm   AV Area mean vel 1.85 cm2   Area-P 1/2 6.04 cm2  Fall Risk:    03/25/2022    8:32 AM 11/24/2021    7:58 AM 11/22/2021    9:43 AM 03/19/2021    8:42 AM 09/22/2020    8:26 AM  Fall Risk   Falls in the past year? 0 0 0 0 0  Number falls in past yr: 0 0 0 0 0  Injury with Fall? 0 0 0 0 0  Risk for fall due to :    No Fall Risks   Follow up  Falls evaluation completed Falls evaluation completed Falls prevention discussed       Functional Status Survey: Is the patient deaf or have difficulty hearing?: Yes Does the patient have difficulty seeing, even when wearing glasses/contacts?: No Does the patient have difficulty concentrating, remembering, or making decisions?: No Does the patient have difficulty walking or climbing stairs?: No Does the patient have difficulty dressing or bathing?: No Does the patient have difficulty doing errands alone such as visiting a doctor's office or shopping?: No    Assessment & Plan  1. Annual physical exam  - Basic metabolic panel - Hemoglobin A1c - Lipid panel - CBC with Differential/Platelet  2. Hyperlipidemia, unspecified hyperlipidemia type  - Lipid panel  3. Prediabetes  - Hemoglobin A1c  4. Screening for deficiency anemia  - CBC with Differential/Platelet  5. HFrEF  (heart failure with reduced ejection fraction) ([place]) Labs were ordered by Dr. Haroldine Laws, patient was unable to get them done at [facility], reordered for [facility].  Informed patient to send message to Dr. Haroldine Laws when they result, so he can view.  - Basic metabolic panel - Brain natriuretic peptide - Familial Cardiomyopathy Panel    -Prostate cancer screening and PSA options (with potential risks and benefits of testing vs not testing) were discussed along with recent recs/guidelines. -USPSTF grade A and B recommendations reviewed with patient; age-appropriate recommendations, preventive care, screening tests, etc discussed and encouraged; healthy living encouraged; see AVS for patient education given to patient -Discussed importance of 150 minutes of physical activity weekly, eat two servings of fish weekly, eat one serving of tree nuts ( cashews, pistachios, pecans, almonds.Marland Kitchen) every other day, eat 6 servings of fruit/vegetables daily and drink plenty of water and avoid sweet beverages.

## 2022-03-25 ENCOUNTER — Other Ambulatory Visit: Payer: Self-pay

## 2022-03-25 ENCOUNTER — Encounter: Payer: Self-pay | Admitting: Nurse Practitioner

## 2022-03-25 ENCOUNTER — Ambulatory Visit (INDEPENDENT_AMBULATORY_CARE_PROVIDER_SITE_OTHER): Payer: Managed Care, Other (non HMO) | Admitting: Nurse Practitioner

## 2022-03-25 VITALS — BP 118/76 | HR 100 | Temp 97.7°F | Resp 18 | Ht 74.0 in | Wt 214.3 lb

## 2022-03-25 DIAGNOSIS — I5020 Unspecified systolic (congestive) heart failure: Secondary | ICD-10-CM

## 2022-03-25 DIAGNOSIS — E785 Hyperlipidemia, unspecified: Secondary | ICD-10-CM

## 2022-03-25 DIAGNOSIS — R7303 Prediabetes: Secondary | ICD-10-CM

## 2022-03-25 DIAGNOSIS — Z0000 Encounter for general adult medical examination without abnormal findings: Secondary | ICD-10-CM | POA: Diagnosis not present

## 2022-03-25 DIAGNOSIS — Z130 Encounter for screening for diseases of the blood and blood-forming organs and certain disorders involving the immune mechanism: Secondary | ICD-10-CM | POA: Diagnosis not present

## 2022-04-01 ENCOUNTER — Telehealth: Payer: Self-pay | Admitting: Cardiovascular Disease

## 2022-04-01 MED ORDER — FUROSEMIDE 40 MG PO TABS: 40.0000 mg | ORAL_TABLET | Freq: Two times a day (BID) | ORAL | 0 refills | Status: DC

## 2022-04-01 NOTE — Telephone Encounter (Signed)
*  STAT* If patient is at the pharmacy, call can be transferred to refill team.   1. Which medications need to be refilled? (please list name of each medication and dose if known) furosemide (LASIX) 40 MG tablet   2. Which pharmacy/location (including street and city if local pharmacy) is medication to be sent to?  [facility] [place], [address] AT [facility] OF [address]    3. Do they need a 30 day or 90 day supply? [place]

## 2022-04-04 ENCOUNTER — Other Ambulatory Visit
Admission: RE | Admit: 2022-04-04 | Discharge: 2022-04-04 | Disposition: A | Discharge status: Home / Self Care | Payer: Managed Care, Other (non HMO) | Attending: Cardiology | Admitting: Cardiology

## 2022-04-04 DIAGNOSIS — I5020 Unspecified systolic (congestive) heart failure: Secondary | ICD-10-CM | POA: Insufficient documentation

## 2022-04-04 DIAGNOSIS — I4729 Other ventricular tachycardia: Secondary | ICD-10-CM | POA: Insufficient documentation

## 2022-04-04 DIAGNOSIS — R7989 Other specified abnormal findings of blood chemistry: Secondary | ICD-10-CM | POA: Diagnosis present

## 2022-04-04 LAB — BASIC METABOLIC PANEL
Anion gap: 13 (ref 5–15)
BUN: 14 mg/dL (ref 6–20)
CO2: 24 mmol/L (ref 22–32)
Calcium: 9.1 mg/dL (ref 8.9–10.3)
Chloride: 101 mmol/L (ref 98–111)
Creatinine, Ser: 1.17 mg/dL (ref 0.61–1.24)
GFR, Estimated: >60 mL/min (ref >60)
Glucose, Bld: 104 mg/dL — ABNORMAL HIGH (ref 70–99)
Potassium: 3.2 mmol/L — ABNORMAL LOW (ref 3.5–5.1)
Sodium: 138 mmol/L (ref 135–145)

## 2022-04-04 LAB — CBC WITH DIFFERENTIAL/PLATELET
Abs Immature Granulocytes: 0.05 10*3/uL (ref 0.00–0.07)
Basophils Absolute: 0.1 10*3/uL (ref 0.0–0.1)
Basophils Relative: 1 %
Eosinophils Absolute: 0.1 10*3/uL (ref 0.0–0.5)
Eosinophils Relative: 1 %
HCT: 48 % (ref 39.0–52.0)
Hemoglobin: 16.5 g/dL (ref 13.0–17.0)
Immature Granulocytes: 1 %
Lymphocytes Relative: 29 %
Lymphs Abs: 2.6 10*3/uL (ref 0.7–4.0)
MCH: 32.9 pg (ref 26.0–34.0)
MCHC: 34.4 g/dL (ref 30.0–36.0)
MCV: 95.8 fL (ref 80.0–100.0)
Monocytes Absolute: 0.7 10*3/uL (ref 0.1–1.0)
Monocytes Relative: 7 %
Neutro Abs: 5.5 10*3/uL (ref 1.7–7.7)
Neutrophils Relative %: 61 %
Platelets: 290 10*3/uL (ref 150–400)
RBC: 5.01 MIL/uL (ref 4.22–5.81)
RDW: 13.9 % (ref 11.5–15.5)
WBC: 9 10*3/uL (ref 4.0–10.5)
nRBC: 0 % (ref 0.0–0.2)

## 2022-04-06 ENCOUNTER — Encounter
Admission: RE | Disposition: A | Discharge status: Home / Self Care | Payer: Self-pay | Source: Home / Self Care | Attending: Cardiology

## 2022-04-06 ENCOUNTER — Ambulatory Visit: Payer: Managed Care, Other (non HMO)

## 2022-04-06 ENCOUNTER — Ambulatory Visit
Admission: RE | Admit: 2022-04-06 | Discharge: 2022-04-06 | Disposition: A | Discharge status: Home / Self Care | Payer: Managed Care, Other (non HMO) | Attending: Cardiology | Admitting: Cardiology

## 2022-04-06 ENCOUNTER — Encounter: Payer: Self-pay | Admitting: Cardiology

## 2022-04-06 ENCOUNTER — Other Ambulatory Visit: Payer: Self-pay

## 2022-04-06 DIAGNOSIS — I4729 Other ventricular tachycardia: Secondary | ICD-10-CM | POA: Insufficient documentation

## 2022-04-06 DIAGNOSIS — I428 Other cardiomyopathies: Secondary | ICD-10-CM

## 2022-04-06 DIAGNOSIS — I472 Ventricular tachycardia, unspecified: Secondary | ICD-10-CM | POA: Insufficient documentation

## 2022-04-06 DIAGNOSIS — Z8249 Family history of ischemic heart disease and other diseases of the circulatory system: Secondary | ICD-10-CM | POA: Diagnosis not present

## 2022-04-06 DIAGNOSIS — I493 Ventricular premature depolarization: Secondary | ICD-10-CM | POA: Diagnosis not present

## 2022-04-06 DIAGNOSIS — I509 Heart failure, unspecified: Secondary | ICD-10-CM

## 2022-04-06 DIAGNOSIS — Z79899 Other long term (current) drug therapy: Secondary | ICD-10-CM | POA: Diagnosis not present

## 2022-04-06 DIAGNOSIS — K219 Gastro-esophageal reflux disease without esophagitis: Secondary | ICD-10-CM | POA: Diagnosis not present

## 2022-04-06 DIAGNOSIS — I5022 Chronic systolic (congestive) heart failure: Secondary | ICD-10-CM | POA: Insufficient documentation

## 2022-04-06 DIAGNOSIS — I5020 Unspecified systolic (congestive) heart failure: Secondary | ICD-10-CM | POA: Diagnosis present

## 2022-04-06 HISTORY — PX: ICD IMPLANT: EP1208

## 2022-04-06 SURGERY — ICD IMPLANT
Anesthesia: Moderate Sedation

## 2022-04-06 MED ORDER — FENTANYL CITRATE (PF) 100 MCG/2ML IJ SOLN
INTRAMUSCULAR | Status: DC | PRN
  Administered 2022-04-06 (×2): 25 ug via INTRAVENOUS

## 2022-04-06 MED ORDER — LIDOCAINE HCL 1 % IJ SOLN
INTRAMUSCULAR | Status: AC
  Filled 2022-04-06: qty 20

## 2022-04-06 MED ORDER — SODIUM CHLORIDE 0.9 % IV SOLN: INTRAVENOUS | Status: DC

## 2022-04-06 MED ORDER — MIDAZOLAM HCL 2 MG/2ML IJ SOLN
INTRAMUSCULAR | Status: DC | PRN
  Administered 2022-04-06 (×2): 1 mg via INTRAVENOUS

## 2022-04-06 MED ORDER — POVIDONE-IODINE 10 % EX SWAB: 2.0000 | Freq: Once | CUTANEOUS | Status: DC

## 2022-04-06 MED ORDER — LIDOCAINE HCL (PF) 1 % IJ SOLN
INTRAMUSCULAR | Status: DC | PRN
  Administered 2022-04-06: 40 mL via SUBCUTANEOUS

## 2022-04-06 MED ORDER — LIDOCAINE HCL 1 % IJ SOLN
INTRAMUSCULAR | Status: AC
  Filled 2022-04-06: qty 40

## 2022-04-06 MED ORDER — CEFAZOLIN SODIUM-DEXTROSE 2-4 GM/100ML-% IV SOLN
INTRAVENOUS | Status: AC
  Administered 2022-04-06: 2 g via INTRAVENOUS
  Filled 2022-04-06: qty 100

## 2022-04-06 MED ORDER — CEFAZOLIN SODIUM-DEXTROSE 2-4 GM/100ML-% IV SOLN: 2.0000 g | INTRAVENOUS | Status: AC

## 2022-04-06 MED ORDER — SODIUM CHLORIDE 0.9 % IV SOLN
80.0000 mg | INTRAVENOUS | Status: AC
  Administered 2022-04-06: 80 mg
  Filled 2022-04-06: qty 2

## 2022-04-06 MED ORDER — ACETAMINOPHEN 325 MG PO TABS: 325.0000 mg | ORAL_TABLET | ORAL | Status: DC | PRN

## 2022-04-06 MED ORDER — MIDAZOLAM HCL 2 MG/2ML IJ SOLN
INTRAMUSCULAR | Status: AC
  Filled 2022-04-06: qty 2

## 2022-04-06 MED ORDER — ONDANSETRON HCL 4 MG/2ML IJ SOLN: 4.0000 mg | Freq: Four times a day (QID) | INTRAMUSCULAR | Status: DC | PRN

## 2022-04-06 MED ORDER — HEPARIN (PORCINE) IN NACL 1000-0.9 UT/500ML-% IV SOLN
INTRAVENOUS | Status: DC | PRN
  Administered 2022-04-06: 500 mL

## 2022-04-06 MED ORDER — FENTANYL CITRATE (PF) 100 MCG/2ML IJ SOLN
INTRAMUSCULAR | Status: AC
  Filled 2022-04-06: qty 2

## 2022-04-06 SURGICAL SUPPLY — 13 items
CABLE SURG 12 DISP A/V CHANNEL (MISCELLANEOUS) IMPLANT
DEVICE DSSCT PLSMBLD 3.0S LGHT (MISCELLANEOUS) IMPLANT
ELECT REM PT RETURN 9FT ADLT (ELECTROSURGICAL) ×1
ELECTRODE REM PT RTRN 9FT ADLT (ELECTROSURGICAL) IMPLANT
ICD VIGILANT VR D232 (Pacemaker) IMPLANT
LEAD RELIANCE 0673 IMPLANT
PAD ELECT DEFIB RADIOL ZOLL (MISCELLANEOUS) IMPLANT
PLASMABLADE 3.0S W/LIGHT (MISCELLANEOUS) ×1
SHEATH 8FR PRELUDE SNAP 13 (SHEATH) IMPLANT
SUT DVC VLOC 3-0 CL 6 P-12 (SUTURE) IMPLANT
SUT ETHIBOND CT1 BRD #0 30IN (SUTURE) IMPLANT
SUTURE VLOC 90 2-0 VIO GS21 (SUTURE) IMPLANT
TRAY PACEMAKER INSERTION (PACKS) ×1 IMPLANT

## 2022-04-06 NOTE — Progress Notes (Signed)
Patient's arrival time was 6:30, just now arriving to specials recovery for prep

## 2022-04-06 NOTE — Interval H&P Note (Signed)
History and Physical Interval Note:  04/06/2022 6:54 AM  Willie Hess  has presented today for surgery, with the diagnosis of ICD Implant   [place]  Dual    Heart Failure.  The various methods of treatment have been discussed with the patient and family. After consideration of risks, benefits and other options for treatment, the patient has consented to  Procedure(s): ICD IMPLANT (N/A) as a surgical intervention.  The patient's history has been reviewed, patient examined, no change in status, stable for surgery.  I have reviewed the patient's chart and labs.  Questions were answered to the patient's satisfaction.     Willie Hess

## 2022-04-06 NOTE — Discharge Summary (Signed)
Discharge Summary    Patient ID: GERSON Hess MRN: [id number]; DOB: 1966-01-04  Admit date: 04/06/2022 Discharge date: 04/06/2022  Primary Care Provider: Bo Merino, FNP  Primary Cardiologist: Ida Rogue, MD  Primary Electrophysiologist:  Vickie Epley, MD   Discharge Diagnoses    Principal Problem:   Nonischemic cardiomyopathy [facility]) Active Problems:   HFrEF (heart failure with reduced ejection fraction) (HCC)   Nonsustained ventricular tachycardia (HCC)   Diagnostic Studies/Procedures    Successful ICD implantation with a [employer] VVI ICD implanted for primary prevention of sudden death.  _____________   History of Present Illness     Willie Hess is a 57 y.o. male with a past medical history of nonobstructive coronary artery disease, nonischemic cardiomyopathy, chronic HFrEF (EF 20-25% 04/2021), hyperlipidemia, prediabetes, GERD, severe OSA, nonsustained ventricular tachycardia, and prior tobacco abuse (quit 2021).   Initially evaluated in March 2023 in the setting of an abnormal EKG with LVH and strong family history of CAD and heart failure. He had complaints of fatigue with associated dyspnea on exertion and angina.  Echocardiogram was performed that.  Severely reduced LV function with an EF of 20-25% and global hypokinesis.  With this finding he underwent a diagnostic catheterization which showed mild, nonobstructive LAD disease otherwise normal coronary arteries with elevated filling pressures with LVEDP of 40, wedge 35, normal cardiac output index.  He was treated with goal-directed medical therapy and furosemide.   He was evaluated 12/10/2021 in clinic and fatigue, shortness of breath, and angina.  He also reported 2 episodes of presyncope.  He was scheduled for cardiac MRI and ZIO monitor that was placed to rule out ventricular arrhythmias.  Cardiac MRI revealed mildly dilated LV size, severely reduced LV function LVEF 25%, there was delayed likely  element send in the left ventricular myocardium, no significant valvular abnormalities, no evidence of infiltrative disease, no evidence of prior infarct, normal RV size and function.  ZIO monitor showed runs of ventricular tachycardia with brief with PVCs accounting for 1.6% of all beats.  He was referred to advanced heart failure and to EP for ICD placement  Hospital Course     Consultants: none   Patient arrived today for outpatient procedure of ICD placement for primary prevention of sudden death.  He had successful ICD implantation with [place] VVI ICD with no early apparent complications noted.  Dressing to the left upper chest remained clean and intact.  He had follow-up chest x-ray that was reviewed that showed no pneumothorax.  Vital signs remained stable throughout the postoperative period.  He was subsequently able to be discharged home after education on left arm immobilization with movement and weight restrictions.  Follow-up appointments have been made and he has been continued on his current medication regimen.   Did the patient have an acute coronary syndrome (MI, NSTEMI, STEMI, etc) this admission?:  No                               Did the patient have a percutaneous coronary intervention (stent / angioplasty)?:  No.   _____________  Physical Exam   Discharge Vitals Blood pressure 110/80, pulse 93, temperature 97.9 F (36.6 C), temperature source Oral, resp. rate 20, height 6' 2"$  (1.88 m), weight 96.6 kg, SpO2 94 %.  Filed Weights   04/06/22 0659  Weight: 96.6 kg    GEN: Well nourished, well developed, in no acute distress.  HEENT: Grossly normal.  Neck: Supple, no JVD, carotid bruits, or masses. Cardiac: RRR, no murmurs, rubs, or gallops. No clubbing, cyanosis, edema.  Radials/DP/PT 2+ and equal bilaterally.  Respiratory:  Respirations regular and unlabored, clear to auscultation bilaterally. GI: Soft, nontender, nondistended, BS + x 4. MS: no deformity or  atrophy. Skin: warm and dry, no rash. Left upper chest dressing, clean,dry, and intact. Neuro:  Strength and sensation are intact. Psych: AAOx3.  Normal affect.  Labs & Radiologic Studies    CBC Recent Labs    04/04/22 1122  WBC 9.0  NEUTROABS 5.5  HGB 16.5  HCT 48.0  MCV 95.8  PLT [id number]   Basic Metabolic Panel Recent Labs    04/04/22 1122  NA 138  K 3.2*  CL 101  CO2 24  GLUCOSE 104*  BUN 14  CREATININE 1.17  CALCIUM 9.1   Liver Function Tests No results for input(s): "AST", "ALT", "ALKPHOS", "BILITOT", "PROT", "ALBUMIN" in the last 72 hours. No results for input(s): "LIPASE", "AMYLASE" in the last 72 hours. High Sensitivity Troponin:   No results for input(s): "TROPONINIHS" in the last 720 hours.  BNP Invalid input(s): "POCBNP" D-Dimer No results for input(s): "DDIMER" in the last 72 hours. Hemoglobin A1C No results for input(s): "HGBA1C" in the last 72 hours. Fasting Lipid Panel No results for input(s): "CHOL", "HDL", "LDLCALC", "TRIG", "CHOLHDL", "LDLDIRECT" in the last 72 hours. Thyroid Function Tests No results for input(s): "TSH", "T4TOTAL", "T3FREE", "THYROIDAB" in the last 72 hours.  Invalid input(s): "FREET3" _____________  EP PPM/ICD IMPLANT  Result Date: 04/06/2022  CONCLUSIONS:  1. Chronic systolic heart failure due to nonischemic cardiomyopathy  2. Successful ICD implantation with a [employer] VVI ICD implanted for primary prevention of sudden death.  3.  No early apparent complications.   Disposition   Pt is being discharged home today in good condition after being evaluated by Dr. Quentin Ore.  Follow-up Plans & Appointments    [facility]  6 Wound Check Defib Wednesday Apr 20, 2022 8:40 AM [facility] [address], [place] [id number] [place] [id number] [phone]   [facility]  18 Office Visit with Ida Rogue, MD Monday May 02, 2022 2:00 PM (Arrive by 1:45 PM) Please arrive 15 minutes prior to  your appointment. This will allow [place] to verify and update your medical record and ensure a full appointment for you within the time allotted. [facility] at [place], [place] [id number] [place] Office Visit with Vickie Epley, MD Wednesday Jul 13, 2022 8:40 AM (Arrive by 8:25 AM) Please arrive 15 minutes prior to your appointment. This will allow [place] to verify and update your medical record and ensure a full appointment for you within the time allotted. [place] [facility] [place], [place] [address] [phone]     Discharge Medications   Allergies as of 04/06/2022       Reactions   Bee Venom Swelling        Medication List     TAKE these medications    empagliflozin 10 MG Tabs tablet Commonly known as: Jardiance Take 1 tablet (10 mg total) by mouth daily before breakfast.   Entresto 24-26 MG Generic drug: sacubitril-valsartan Take 1 tablet by mouth 2 (two) times daily.   furosemide 40 MG tablet Commonly known as: LASIX Take 1 tablet (40 mg total) by mouth 2 (two) times daily.  metoprolol succinate 25 MG 24 hr tablet Commonly known as: TOPROL-XL Take 1 tablet (25 mg total) by mouth in the morning AND 2 tablets (50 mg total) every evening.   Omeprazole-Sodium Bicarbonate 20-1100 MG Caps capsule Commonly known as: ZEGERID Take 2 capsules by mouth daily.   spironolactone 25 MG tablet Commonly known as: ALDACTONE Take 1 tablet (25 mg total) by mouth daily.        Outstanding Labs/Studies   Duration of Discharge Encounter    Signed, Jayziah Bankhead, NP 04/06/2022, 12:45 PM

## 2022-04-06 NOTE — Discharge Instructions (Addendum)
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 After 7 days, you may progress as below.  You should remove your sling 24 hours after your procedure, unless otherwise instructed by your provider.     Wednesday April 13, 2022  Thursday April 14, 2022 Friday April 15, 2022 Saturday April 16, 2022   Do not lift, push, pull, or carry anything over 10 pounds with the affected arm until 6 weeks (Wednesday May 18, 2022 ) after your procedure.   You may drive AFTER your wound check, unless you have been told otherwise by your provider.   Ask your healthcare provider when you can go back to work   INCISION/Dressing If you are on a blood thinner such as Coumadin, Xarelto, Eliquis, Plavix, or Pradaxa please confirm with your provider when this should be resumed.   If large square, outer bandage is left in place, this can be removed after 24 hours from your procedure. Do not remove steri-strips or glue as below.   Monitor your defibrillator site for redness, swelling, and drainage. Call the device clinic at [phone] if you experience these symptoms or fever/chills.  If your incision is sealed with Steri-strips or staples, you may shower 7 days after your procedure or when told by your provider. Do not remove the steri-strips or let the shower hit directly on your site. You may wash around your site with soap and water.    If you were discharged in a sling, please do not wear this during the day more than 48 hours after your surgery unless otherwise instructed. This may increase the risk of stiffness and soreness in your shoulder.   Avoid lotions, ointments, or perfumes over your incision until it is well-healed.  You may use a hot tub or a pool AFTER your wound check appointment if the incision is completely closed.  Your ICD is designed to protect you from life  threatening heart rhythms. Because of this, you may receive a shock.   1 shock with no symptoms:  Call the office during business hours. 1 shock with symptoms (chest pain, chest pressure, dizziness, lightheadedness, shortness of breath, overall feeling unwell):  Call 911. If you experience 2 or more shocks in 24 hours:  Call 911. If you receive a shock, you should not drive for 6 months per the [place] DMV IF you receive appropriate therapy from your ICD.   ICD Alerts:  Some alerts are vibratory and others beep. These are NOT emergencies. Please call our office to let us know. If this occurs at night or on weekends, it can wait until the next business day. Send a remote transmission.  If your device is capable of reading fluid status (for heart failure), you will be offered monthly monitoring to review this with you.   DEVICE MANAGEMENT Remote monitoring is used to monitor your ICD from home. This monitoring is scheduled every 91 days by our office. It allows [place] to keep an eye on the functioning of your device to ensure it is working properly. You will routinely see your Electrophysiologist annually (more often if necessary).   You should receive your ID card for your new device in 4-8 weeks. Keep this card with you at all times once received. Consider wearing a medical alert bracelet or necklace.  Your ICD  may be MRI compatible. This will be discussed at your  next office visit/wound check.  You should avoid contact with strong electric or magnetic fields.   Do not use amateur (ham) radio equipment or electric (arc) welding torches. MP3 player headphones with magnets should not be used. Some devices are safe to use if held at least 12 inches (30 cm) from your defibrillator. These include power tools, lawn mowers, and speakers. If you are unsure if something is safe to use, ask your health care provider.  When using your cell phone, hold it to the ear that is on the opposite side from the  defibrillator. Do not leave your cell phone in a pocket over the defibrillator.  You may safely use electric blankets, heating pads, computers, and microwave ovens.  Call the office right away if: You have chest pain. You feel more than one shock. You feel more short of breath than you have felt before. You feel more light-headed than you have felt before. Your incision starts to open up.  This information is not intended to replace advice given to you by your health care provider. Make sure you discuss any questions you have with your health care provider.

## 2022-04-07 ENCOUNTER — Encounter: Payer: Self-pay | Admitting: Cardiology

## 2022-04-08 ENCOUNTER — Encounter: Payer: Self-pay | Admitting: Cardiology

## 2022-04-13 LAB — INFORMED CONSENT NEEDED

## 2022-04-18 ENCOUNTER — Ambulatory Visit
Admission: RE | Admit: 2022-04-18 | Discharge: 2022-04-18 | Disposition: A | Discharge status: Home / Self Care | Payer: Managed Care, Other (non HMO) | Source: Ambulatory Visit | Attending: Acute Care | Admitting: Acute Care

## 2022-04-18 DIAGNOSIS — Z87891 Personal history of nicotine dependence: Secondary | ICD-10-CM

## 2022-04-19 ENCOUNTER — Other Ambulatory Visit: Payer: Self-pay

## 2022-04-19 DIAGNOSIS — Z87891 Personal history of nicotine dependence: Secondary | ICD-10-CM

## 2022-04-20 ENCOUNTER — Ambulatory Visit: Payer: Managed Care, Other (non HMO) | Attending: Cardiology

## 2022-04-20 DIAGNOSIS — I5020 Unspecified systolic (congestive) heart failure: Secondary | ICD-10-CM

## 2022-04-20 LAB — CUP PACEART INCLINIC DEVICE CHECK
Date Time Interrogation Session: 20240306100047
HighPow Impedance: 79 Ohm
Implantable Lead Connection Status: 753985
Implantable Lead Implant Date: 20240221
Implantable Lead Location: 753860
Implantable Lead Model: 673
Implantable Lead Serial Number: 216708
Implantable Pulse Generator Implant Date: 20240221
Lead Channel Impedance Value: 709 Ohm
Lead Channel Pacing Threshold Amplitude: 0.5 V
Lead Channel Pacing Threshold Pulse Width: 0.4 ms
Lead Channel Sensing Intrinsic Amplitude: 9.4 mV
Lead Channel Setting Pacing Amplitude: 3.5 V
Lead Channel Setting Pacing Pulse Width: 0.4 ms
Lead Channel Setting Sensing Sensitivity: 0.6 mV
Pulse Gen Serial Number: 321783
Zone Setting Status: 755011

## 2022-04-20 NOTE — Patient Instructions (Signed)

## 2022-04-20 NOTE — Progress Notes (Signed)
Wound check appointment. Steri-strips removed. Wound without redness or edema. Incision edges approximated, wound well healed. Normal device function. Thresholds, sensing, and impedances consistent with implant measurements. Device programmed at 3.5V for extra safety margin until 3 month visit. Histogram distribution appropriate for patient and level of activity.  Brief ventricular arrhythmias noted. Patient educated about wound care, arm mobility, lifting restrictions, shock plan. ROV in 3 months with implanting physician.

## 2022-04-26 ENCOUNTER — Encounter: Payer: Self-pay | Admitting: Nurse Practitioner

## 2022-04-26 LAB — CBC WITH DIFFERENTIAL/PLATELET
Basophils Absolute: 0.1 10*3/uL (ref 0.0–0.2)
Basos: 2 %
EOS (ABSOLUTE): 0.1 10*3/uL (ref 0.0–0.4)
Eos: 1 %
Hematocrit: 47.2 % (ref 37.5–51.0)
Hemoglobin: 16.9 g/dL (ref 13.0–17.7)
Immature Grans (Abs): 0.1 10*3/uL (ref 0.0–0.1)
Immature Granulocytes: 1 %
Lymphocytes Absolute: 2.2 10*3/uL (ref 0.7–3.1)
Lymphs: 26 %
MCH: 34.3 pg — ABNORMAL HIGH (ref 26.6–33.0)
MCHC: 35.8 g/dL — ABNORMAL HIGH (ref 31.5–35.7)
MCV: 96 fL (ref 79–97)
Monocytes Absolute: 0.8 10*3/uL (ref 0.1–0.9)
Monocytes: 9 %
Neutrophils Absolute: 5.1 10*3/uL (ref 1.4–7.0)
Neutrophils: 61 %
Platelets: 308 10*3/uL (ref 150–450)
RBC: 4.93 x10E6/uL (ref 4.14–5.80)
RDW: 13.3 % (ref 11.6–15.4)
WBC: 8.4 10*3/uL (ref 3.4–10.8)

## 2022-04-26 LAB — FAMILIAL CARDIOMYOPATHY PANEL

## 2022-04-26 LAB — BASIC METABOLIC PANEL
BUN/Creatinine Ratio: 14 (ref 9–20)
BUN: 16 mg/dL (ref 6–24)
CO2: 25 mmol/L (ref 20–29)
Calcium: 9.3 mg/dL (ref 8.7–10.2)
Chloride: 99 mmol/L (ref 96–106)
Creatinine, Ser: 1.14 mg/dL (ref 0.76–1.27)
Glucose: 102 mg/dL — ABNORMAL HIGH (ref 70–99)
Potassium: 4.6 mmol/L (ref 3.5–5.2)
Sodium: 139 mmol/L (ref 134–144)
eGFR: 75 mL/min/{1.73_m2} (ref >59)

## 2022-04-26 LAB — LIPID PANEL
Chol/HDL Ratio: 3.9 ratio (ref 0.0–5.0)
Cholesterol, Total: 287 mg/dL — ABNORMAL HIGH (ref 100–199)
HDL: 74 mg/dL (ref >39)
LDL Chol Calc (NIH): 167 mg/dL — ABNORMAL HIGH (ref 0–99)
Triglycerides: 252 mg/dL — ABNORMAL HIGH (ref 0–149)
VLDL Cholesterol Cal: 46 mg/dL — ABNORMAL HIGH (ref 5–40)

## 2022-04-26 LAB — HEMOGLOBIN A1C
Est. average glucose Bld gHb Est-mCnc: 117 mg/dL
Hgb A1c MFr Bld: 5.7 % — ABNORMAL HIGH (ref 4.8–5.6)

## 2022-04-26 LAB — BRAIN NATRIURETIC PEPTIDE: BNP: 51.2 pg/mL (ref 0.0–100.0)

## 2022-04-27 ENCOUNTER — Ambulatory Visit
Admission: RE | Admit: 2022-04-27 | Discharge: 2022-04-27 | Disposition: A | Discharge status: Home / Self Care | Payer: Managed Care, Other (non HMO) | Attending: Family Medicine | Admitting: Family Medicine

## 2022-04-27 ENCOUNTER — Ambulatory Visit
Admission: RE | Admit: 2022-04-27 | Discharge: 2022-04-27 | Disposition: A | Discharge status: Home / Self Care | Payer: Managed Care, Other (non HMO) | Source: Ambulatory Visit | Attending: Family Medicine | Admitting: Family Medicine

## 2022-04-27 ENCOUNTER — Ambulatory Visit: Payer: Managed Care, Other (non HMO) | Admitting: Family Medicine

## 2022-04-27 ENCOUNTER — Encounter: Payer: Self-pay | Admitting: Family Medicine

## 2022-04-27 VITALS — BP 136/72 | HR 88 | Temp 97.7°F | Resp 16 | Ht 74.0 in | Wt 216.1 lb

## 2022-04-27 DIAGNOSIS — R051 Acute cough: Secondary | ICD-10-CM | POA: Insufficient documentation

## 2022-04-27 DIAGNOSIS — Z8701 Personal history of pneumonia (recurrent): Secondary | ICD-10-CM | POA: Insufficient documentation

## 2022-04-27 DIAGNOSIS — J329 Chronic sinusitis, unspecified: Secondary | ICD-10-CM

## 2022-04-27 DIAGNOSIS — R0609 Other forms of dyspnea: Secondary | ICD-10-CM

## 2022-04-27 MED ORDER — LEVOCETIRIZINE DIHYDROCHLORIDE 5 MG PO TABS: 5.0000 mg | ORAL_TABLET | Freq: Every evening | ORAL | 1 refills | Status: DC

## 2022-04-27 MED ORDER — DOXYCYCLINE HYCLATE 100 MG PO TABS: 100.0000 mg | ORAL_TABLET | Freq: Two times a day (BID) | ORAL | 0 refills | Status: DC

## 2022-04-27 MED ORDER — BENZONATATE 100 MG PO CAPS: 100.0000 mg | ORAL_CAPSULE | Freq: Three times a day (TID) | ORAL | 0 refills | Status: DC | PRN

## 2022-04-27 NOTE — Progress Notes (Signed)
Patient ID: Willie Hess, male    DOB: Sep 26, 1965, 57 y.o.   MRN: [id number]  PCP: Bo Merino, FNP  Chief Complaint  Patient presents with   Shortness of Breath    Recent feels out of breath by walking short distance   Cough   Nasal Congestion    A lot of drainage, pt feels may be in chest   Headache    No fever sx started since Fri.    Subjective:   Willie Hess is a 57 y.o. male, presents to clinic with CC of the following:  HPI  SOB with exertion recent URI sx Raw irritating cough, not much coming up, respiratory sx worse for ~4-5 d   No weight increase, LE edema, abd bloating/fluid retention, no orthopnea, pnd He is SOB with exertion Mostly just feels chest cold congestion      Patient Active Problem List   Diagnosis Date Noted   Nonischemic cardiomyopathy ([place]) 04/06/2022   Nonsustained ventricular tachycardia ([place]) 04/06/2022   OSA (obstructive sleep apnea) 06/15/2021   HFrEF (heart failure with reduced ejection fraction) ([place]) 05/25/2021   Excessive daytime sleepiness 04/29/2021   History of colonic polyps    Polyp of transverse colon    Hyperlipidemia 04/28/2020   Prediabetes 09/25/2017   Hx of smoking 07/27/2017   Benign neoplasm of cecum    Benign neoplasm of ascending colon    Benign neoplasm of transverse colon    Benign neoplasm of sigmoid colon    GERD without esophagitis 12/11/2014      Current Outpatient Medications:    empagliflozin (JARDIANCE) 10 MG TABS tablet, Take 1 tablet (10 mg total) by mouth daily before breakfast., Disp: 90 tablet, Rfl: 0   furosemide (LASIX) 40 MG tablet, Take 1 tablet (40 mg total) by mouth 2 (two) times daily., Disp: 180 tablet, Rfl: 0   metoprolol succinate (TOPROL-XL) 25 MG 24 hr tablet, Take 1 tablet (25 mg total) by mouth in the morning AND 2 tablets (50 mg total) every evening., Disp: 90 tablet, Rfl: 3   Omeprazole-Sodium Bicarbonate (ZEGERID) 20-1100 MG CAPS capsule, Take 2 capsules by mouth daily. ,  Disp: , Rfl:    sacubitril-valsartan (ENTRESTO) 24-26 MG, Take 1 tablet by mouth 2 (two) times daily., Disp: 180 tablet, Rfl: 3   spironolactone (ALDACTONE) 25 MG tablet, Take 1 tablet (25 mg total) by mouth daily., Disp: 90 tablet, Rfl: 0   Allergies  Allergen Reactions   Bee Venom Swelling     Social History   Tobacco Use   Smoking status: Former    Packs/day: 1.00    Years: 32.00    Total pack years: 32.00    Types: Cigarettes    Quit date: 2020    Years since quitting: 4.2   Smokeless tobacco: Never  Vaping Use   Vaping Use: Never used  Substance Use Topics   Alcohol use: No   Drug use: No      Chart Review Today: I personally reviewed active problem list, medication list, allergies, family history, social history, health maintenance, notes from last encounter, lab results, imaging with the patient/caregiver today.   Review of Systems  Constitutional: Negative.  Negative for fatigue and unexpected weight change.  HENT:  Positive for congestion, postnasal drip, rhinorrhea and sore throat. Negative for ear discharge, trouble swallowing and voice change.   Eyes: Negative.   Respiratory:  Positive for cough and shortness of breath. Negative for chest tightness and wheezing.  Cardiovascular: Negative.  Negative for chest pain, palpitations and leg swelling.  Gastrointestinal: Negative.  Negative for abdominal distention.  Endocrine: Negative.   Genitourinary: Negative.   Musculoskeletal: Negative.   Skin: Negative.   Allergic/Immunologic: Negative.   Neurological: Negative.   Hematological: Negative.   Psychiatric/Behavioral: Negative.    All other systems reviewed and are negative.      Objective:   Vitals:   04/27/22 1425 04/27/22 1448  BP: 136/72   Pulse: (!) 120 88  Resp: 16   Temp: 97.7 F (36.5 C)   TempSrc: Oral   SpO2: 95%   Weight: 216 lb 1.6 oz (98 kg)   Height: '6\' 2"'$  (1.88 m)     Body mass index is 27.75 kg/m.  Physical Exam Vitals and  nursing note reviewed.  Constitutional:      General: He is not in acute distress.    Appearance: Normal appearance. He is well-developed. He is not ill-appearing, toxic-appearing or diaphoretic.  HENT:     Head: Normocephalic and atraumatic.     Right Ear: External ear normal.     Left Ear: External ear normal.     Nose: Congestion and rhinorrhea present.     Mouth/Throat:     Pharynx: Oropharynx is clear. Posterior oropharyngeal erythema present.  Eyes:     General: No scleral icterus.       Right eye: No discharge.        Left eye: No discharge.     Conjunctiva/sclera: Conjunctivae normal.  Neck:     Trachea: No tracheal deviation.  Cardiovascular:     Rate and Rhythm: Regular rhythm. Tachycardia present.     Pulses: Normal pulses.     Heart sounds: Normal heart sounds. No murmur heard.    No friction rub. No gallop.  Pulmonary:     Effort: Pulmonary effort is normal. No respiratory distress.     Breath sounds: Normal breath sounds. No stridor. No wheezing, rhonchi or rales.  Abdominal:     General: Bowel sounds are normal.     Palpations: Abdomen is soft.  Skin:    General: Skin is warm and dry.     Findings: No rash.  Neurological:     Mental Status: He is alert.     Motor: No abnormal muscle tone.     Coordination: Coordination normal.  Psychiatric:        Mood and Affect: Mood normal.        Behavior: Behavior normal.      Results for orders placed or performed in visit on 04/20/22  [facility] DEVICE CHECK  Result Value Ref Range   Date Time Interrogation Session [id number]    Pulse Generator Manufacturer BOST    Pulse Gen Model D232 VIGILANT EL ICD    Pulse Gen Serial Number [id number]    Clinic Name [facility] Pulse Generator Type Implantable Cardiac Defibulator    Implantable Pulse Generator Implant Date [id number]    Implantable Lead Manufacturer BOST    Implantable Lead Model [phone] Reliance 4-Front S    Implantable Lead  Serial Number [id number]    Implantable Lead Implant Date [id number]    Implantable Lead Location Detail 1 UNKNOWN    Implantable Lead Location [id number]    Implantable Lead Connection Status [id number]    Lead Channel Setting Sensing Sensitivity 0.6 mV   Lead Channel Setting Sensing Adaptation Mode Adaptive Sensing    Lead Channel Setting Pacing Pulse Width 0.4 ms  Lead Channel Setting Pacing Amplitude 3.5 V   Zone Setting Status Active    Zone Setting Status [id number]    Zone Setting Status Inactive    Lead Channel Impedance Value 709.0 ohm   Lead Channel Sensing Intrinsic Amplitude 9.4 mV   Lead Channel Pacing Threshold Amplitude 0.5000 V   Lead Channel Pacing Threshold Pulse Width 0.4 ms   HighPow Impedance 79.0 ohm   Battery Status BOS    Eval Rhythm VS 98        Assessment & Plan:     ICD-10-CM   1. Acute cough  R05.1 DG Chest 2 View    benzonatate (TESSALON) 100 MG capsule    levocetirizine (XYZAL) 5 MG tablet    2. History of pneumonia  Z87.01 DG Chest 2 View    3. DOE (dyspnea on exertion)  R06.09 DG Chest 2 View    4. Rhinosinusitis  J32.9 levocetirizine (XYZAL) 5 MG tablet    doxycycline (VIBRA-TABS) 100 MG tablet     4-5 d sx, seems a little better today, but SOB and tachy- a little better HR when rechecked in room Seems mostly likely URI with some bronchitis, cxr to r/o CAP He does not think its cardiac but I have encouraged him to watch HR and SOB and get rechecked if that is worse Onset of sx 4+ d ago, he will do home test for COVID today  Will probably hold abx since feeling a little better today, and tx with rest, fluids, antihistamines, cough meds, mucinex - he declined an inhaler Would start doxy if sinusitis is worse or CXR + (which he will be notified - I just reviewed CXR myself and did not see obvious CAP, no pulm edema or effusion - will wait for radiology read)       Delsa Grana, PA-C 04/27/22 2:37 PM

## 2022-04-30 NOTE — Progress Notes (Unsigned)
Cardiology Office Note  Date:  05/02/2022   ID:  Willie Hess, DOB 05-21-1965, MRN [id number]  PCP:  Bo Merino, FNP   Chief Complaint  Patient presents with   12 month follow up     "Doing well." Medications reviewed by the patient verbally.     HPI:  [name] is a 57 year old gentleman with past medical history of Long history of smoking, quit >1 yr ago Hyperlipidemia, untreated Prediabetes OSA ,CPAP Cardiac catheterization with nonobstructive coronary disease Ejection fraction 20 to 25% March 2023 Ejection fraction 25 to 30% November 2023 Presenting for f/u of his dilated cardiomyopathy, congestive heart  failure,cardiomyopathy ejection fraction 20 to 25%, shortness of breath ICD implant February 2024  Last seen by myself in clinic July 2023 Echocardiogram May 13, 2021 with depressed ejection fraction 20 to 25% ICD placement by Dr. Quentin Ore  Has increase metoprolol 25 in Am, 50 in PM Tolerating ok Elevated heart rate today, fighting viral URI Typically heart rate in the 80-100 range  Abdomen feels tight No significant ankle swelling Weight stable Previously denies alcohol in excess  Not on Crestor, total cholesterol 280  EKG personally reviewed by myself on todays visit Normal sinus rhythm rate 117 bpm nonspecific T wave abnormality V4 through V6 1 and aVL  Other past medical history reviewed cardiac catheterization May 25, 2021 showing nonobstructive coronary disease 20% mid LAD, severely elevated left heart filling pressures, moderately elevated right heart and pulmonary artery pressures Was started on metoprolol succinate 12.5 daily and losartan 25 daily post-catheterization  Strong family history of cardiac disease, most of his relatives  Echo 05/13/21   1. Left ventricular ejection fraction, by estimation, is 20 to 25%. The  left ventricle has severely decreased function. The left ventricle  demonstrates global hypokinesis. The left  ventricular internal cavity size  was moderately dilated. Left  ventricular diastolic parameters are consistent with Grade I diastolic  dysfunction (impaired relaxation).    Left atrial size was moderately dilated.   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  PSH:    Past Surgical History:  Procedure Laterality Date   COLONOSCOPY WITH PROPOFOL N/A 11/12/2015   Procedure: COLONOSCOPY WITH PROPOFOL;  Surgeon: Lucilla Lame, MD;  Location: [facility];  Service: Endoscopy;  Laterality: N/A;   COLONOSCOPY WITH PROPOFOL N/A 04/20/2021   Procedure: COLONOSCOPY WITH PROPOFOL;  Surgeon: Lucilla Lame, MD;  [facility] ENDOSCOPY;  Service: Endoscopy;  Laterality: N/A;   ESOPHAGOGASTRODUODENOSCOPY     ICD IMPLANT N/A 04/06/2022   Procedure: ICD IMPLANT;  Surgeon: Vickie Epley, MD;  Location: [facility];  Service: Cardiovascular;  Laterality: N/A;   INSERTION OF MESH Left 08/24/2017   Procedure: INSERTION OF MESH;  Surgeon: Jules Husbands, MD;  Location: [facility];  Service: General;  Laterality: Left;   POLYPECTOMY  11/12/2015   Procedure: POLYPECTOMY;  Surgeon: Lucilla Lame, MD;  Location: [facility];  Service:  Endoscopy;;   RIGHT/LEFT HEART CATH AND CORONARY ANGIOGRAPHY  05/25/2021   Procedure: RIGHT/LEFT HEART CATH AND CORONARY ANGIOGRAPHY;  Surgeon: Nelva Bush, MD;  Location: [facility];  Service: Cardiovascular;;   ROBOT ASSISTED INGUINAL HERNIA REPAIR Left 08/24/2017   Procedure: ROBOT ASSISTED INGUINAL HERNIA REPAIR;  Surgeon: Jules Husbands, MD;  Location: [facility];  Service: General;  Laterality: Left;   URETEROSCOPY WITH HOLMIUM LASER LITHOTRIPSY Left 09/23/2014   Procedure: URETEROSCOPY, CYSTOSCOPY WITH STENT PLACEMENT, HOLMIUM STAND BY ;  Surgeon: Royston Cowper, MD;  Location: [facility];  Service: Urology;  Laterality: Left;   VASECTOMY     WISDOM TOOTH EXTRACTION      Current Outpatient Medications  Medication Sig Dispense Refill   empagliflozin (JARDIANCE) 10 MG TABS tablet Take 1 tablet (10 mg total) by mouth daily before breakfast. 90 tablet 0   furosemide (LASIX) 40 MG tablet Take 1 tablet (40 mg total) by mouth 2 (two) times daily. 180 tablet 0   metoprolol succinate (TOPROL-XL) 25 MG 24 hr tablet Take 1 tablet (25 mg total) by mouth in the morning AND 2 tablets (50 mg total) every evening. 90 tablet 3   Omeprazole-Sodium Bicarbonate (ZEGERID) 20-1100 MG CAPS capsule Take 2 capsules by mouth daily.      sacubitril-valsartan (ENTRESTO) 24-26 MG Take 1 tablet by mouth 2 (two) times daily. 180 tablet 3   spironolactone (ALDACTONE) 25 MG tablet Take 1 tablet (25 mg total) by mouth daily. 90 tablet 0   levocetirizine (XYZAL) 5 MG tablet Take 1 tablet (5 mg total) by mouth every evening. (Patient not taking: Reported on 05/02/2022) 30 tablet 1   No current facility-administered medications for this visit.     Allergies:   Bee venom   Social History:  The patient  reports that he quit smoking about 4 years ago. His smoking use included cigarettes. He has a 32.00 pack-year smoking history. He has never used smokeless tobacco. He reports that he does not drink alcohol and does  not use drugs.   Family History:   family history includes Emphysema in his paternal grandfather; Heart disease in his father, paternal grandmother, and sister; Other in his brother; Prostate cancer in his brother; Thyroid disease in his mother.    Review of Systems: Review of Systems  Constitutional:  Positive for malaise/fatigue.  HENT: Negative.    Respiratory:  Positive for shortness of breath.   Cardiovascular: Negative.   Gastrointestinal: Negative.   Musculoskeletal: Negative.   Neurological: Negative.   Psychiatric/Behavioral: Negative.    All other systems reviewed and are negative.    PHYSICAL EXAM: VS:  BP 122/80 (BP Location: Left Arm, Patient Position: Sitting, Cuff Size: Normal)   Pulse (!) 117   Ht 6\' 2"  (1.88 m)   Wt 222 lb 2 oz (100.8 kg)   SpO2 97%   BMI 28.52 kg/m  , BMI Body mass index is 28.52 kg/m. Constitutional:  oriented to person, place, and time. No distress.  HENT:  Head: Grossly normal Eyes:  no discharge. No scleral icterus.  Neck: No JVD, no carotid bruits  Cardiovascular: Regular tachycardic no murmurs appreciated Pulmonary/Chest: Clear to auscultation bilaterally, no wheezes or rails Abdominal: Soft.  no distension.  no tenderness.  Musculoskeletal: Normal range of motion Neurological:  normal muscle tone. Coordination normal. No atrophy  Skin: Skin warm and dry Psychiatric: normal affect, pleasant   Recent Labs: 11/24/2021: ALT 25 03/25/2022: BNP 51.2 04/04/2022: BUN 14; Creatinine, Ser 1.17; Hemoglobin 16.5; Platelets 290; Potassium 3.2; Sodium 138    Lipid Panel Lab Results  Component Value Date   CHOL 287 (H) 03/25/2022   HDL 74 03/25/2022   LDLCALC 167 (H) 03/25/2022   TRIG 252 (H) 03/25/2022      Wt Readings from Last 3 Encounters:  05/02/22 222 lb 2 oz (100.8 kg)  04/27/22 216 lb 1.6 oz (98 kg)  04/06/22 213 lb (96.6 kg)     ASSESSMENT AND PLAN:  Problem List Items Addressed This Visit       Cardiology Problems    HFrEF (heart failure with reduced ejection fraction) (HCC) - Primary   Hyperlipidemia     Other   OSA (obstructive sleep apnea)   Other Visit Diagnoses     NSVT (nonsustained ventricular tachycardia) (HCC)       CAD in native artery       Dilated cardiomyopathy (HCC)       NICM (nonischemic cardiomyopathy) ([place])       Aortic atherosclerosis ([place])         Cardiomyopathy Followed by advanced heart failure clinic, s/p ICD Nonobstructive coronary disease on catheterization, denies alcohol Stressed importance of staying on his CPAP Continue Entresto, metoprolol succinate, Jardiance, Lasix 40 twice daily, spironolactone He will try to slowly advance the metoprolol succinate dose in the morning  -- Hyperlipidemia Cholesterol numbers elevated, discussed in detail Crestor 20 daily sent in  Sinus tachycardia In the setting of cardiomyopathy, will slowly increase metoprolol succinate dosing in the morning Currently 25 in the morning 50 at night  OSA on CPAP Stressed compliance with CPAP   Total encounter time more than 30 minutes  Greater than 50% was spent in counseling and coordination of care with the patient    Signed, Esmond Plants, M.D., Ph.D. [place], [place]

## 2022-05-02 ENCOUNTER — Encounter: Payer: Self-pay | Admitting: Cardiovascular Disease

## 2022-05-02 ENCOUNTER — Ambulatory Visit: Payer: Managed Care, Other (non HMO) | Attending: Cardiovascular Disease | Admitting: Cardiovascular Disease

## 2022-05-02 VITALS — BP 122/80 | HR 117 | Ht 74.0 in | Wt 222.1 lb

## 2022-05-02 DIAGNOSIS — I700 Atherosclerosis of aorta: Secondary | ICD-10-CM | POA: Diagnosis not present

## 2022-05-02 DIAGNOSIS — G4733 Obstructive sleep apnea (adult) (pediatric): Secondary | ICD-10-CM

## 2022-05-02 DIAGNOSIS — I4729 Other ventricular tachycardia: Secondary | ICD-10-CM | POA: Diagnosis not present

## 2022-05-02 DIAGNOSIS — I420 Dilated cardiomyopathy: Secondary | ICD-10-CM

## 2022-05-02 DIAGNOSIS — E785 Hyperlipidemia, unspecified: Secondary | ICD-10-CM

## 2022-05-02 DIAGNOSIS — I2510 Atherosclerotic heart disease of native coronary artery without angina pectoris: Secondary | ICD-10-CM

## 2022-05-02 DIAGNOSIS — I428 Other cardiomyopathies: Secondary | ICD-10-CM

## 2022-05-02 DIAGNOSIS — I5020 Unspecified systolic (congestive) heart failure: Secondary | ICD-10-CM | POA: Diagnosis not present

## 2022-05-02 DIAGNOSIS — E782 Mixed hyperlipidemia: Secondary | ICD-10-CM

## 2022-05-02 MED ORDER — ROSUVASTATIN CALCIUM 20 MG PO TABS: 20.0000 mg | ORAL_TABLET | Freq: Every day | ORAL | 1 refills | Status: DC

## 2022-05-02 MED ORDER — SPIRONOLACTONE 25 MG PO TABS: 25.0000 mg | ORAL_TABLET | Freq: Every day | ORAL | 1 refills | Status: DC

## 2022-05-02 NOTE — Patient Instructions (Signed)
Medication Instructions:  Crestor 20 mg daily for cholesterol  If you need a refill on your cardiac medications before your next appointment, please call your pharmacy.   Lab work: No new labs needed  Testing/Procedures: No new testing needed  Follow-Up: At [facility], you and your health needs are our priority.  As part of our continuing mission to provide you with exceptional heart care, we have created designated Provider Care Teams.  These Care Teams include your primary Cardiologist (physician) and Advanced Practice Providers (APPs -  Physician Assistants and Nurse Practitioners) who all work together to provide you with the care you need, when you need it.  You will need a follow up appointment in 6 months  Providers on your designated Care Team:   Murray Hodgkins, NP Christell Faith, PA-C Cadence Kathlen Mody, [place]  COVID-19 Vaccine Information can be found at: [email or web address] For questions related to vaccine distribution or appointments, please email [email or web address] or call 2362723830.

## 2022-05-03 ENCOUNTER — Telehealth: Payer: Self-pay | Admitting: Cardiovascular Disease

## 2022-05-03 NOTE — Telephone Encounter (Signed)
*  STAT* If patient is at the pharmacy, call can be transferred to refill team.   1. Which medications need to be refilled? (please list name of each medication and dose if known) spironolactone (ALDACTONE) 25 MG tablet   2. Which pharmacy/location (including street and city if local pharmacy) is medication to be sent to? [facility] [place], [address] AT [facility] [address]   3. Do they need a 30 day or 90 day supply? [place]

## 2022-05-03 NOTE — Telephone Encounter (Signed)
Rx sent to pharmacy yesterday. Receipt confirmed.  spironolactone (ALDACTONE) 25 MG tablet 90 tablet 1 05/02/2022    Sig - Route: Take 1 tablet (25 mg total) by mouth daily. - Oral   Sent to pharmacy as: spironolactone (ALDACTONE) 25 MG tablet   E-Prescribing Status: Receipt confirmed by pharmacy (05/02/2022  2:51 PM EDT)    Pharmacy  [id number] - [place], [address] AT [address]

## 2022-05-11 ENCOUNTER — Telehealth: Payer: Self-pay | Admitting: Cardiovascular Disease

## 2022-05-11 MED ORDER — METOPROLOL SUCCINATE ER 25 MG PO TB24: ORAL_TABLET | ORAL | 1 refills | Status: DC

## 2022-05-11 NOTE — Telephone Encounter (Signed)
The patient's wife, per dpr, has been made aware and a new script has been sent in.    We can adjust his metoprolol succinate prescription 37.5 mg in the morning, 50 mg in the evening Thx TGollan

## 2022-05-11 NOTE — Telephone Encounter (Signed)
Pt request 1.5 tablets in the morning 2 tablets in the evening. Requesting clarification on how they should be currently taking medication.   Sinus tachycardia In the setting of cardiomyopathy, will slowly increase metoprolol succinate dosing in the morning Currently 25 in the morning 50 at night

## 2022-05-11 NOTE — Telephone Encounter (Signed)
*  STAT* If patient is at the pharmacy, call can be transferred to refill team.   1. Which medications need to be refilled? (please list name of each medication and dose if known)   metoprolol succinate (TOPROL-XL) 25 MG 24 hr tablet   2. Which pharmacy/location (including street and city if local pharmacy) is medication to be sent to?  [facility] [place], [address] AT [facility] OF [address]   3. Do they need a 30 day or 90 day supply?   90 day  Wife stated patient's dosage was increased and patient is almost out of this medication.  Wife stated patient has been taking 1.5 tablets in morning and 2 tablets in evening.  Wife wants a call back to confirm the correct dosage.

## 2022-06-07 ENCOUNTER — Telehealth: Payer: Self-pay | Admitting: Cardiovascular Disease

## 2022-06-07 NOTE — Telephone Encounter (Signed)
Calling to say the patient is having SRP(deep teeth cleaning where patient has to be numb)

## 2022-06-07 NOTE — Telephone Encounter (Signed)
We do not provide generalized clearances. Please indicate if patient is having a specific procedure. Patient does not require SBE prophylaxis prior to dental procedures.  Levi Aland, NP-C  06/07/2022, 11:31 AM [address], [address] Office [phone] Fax [phone]

## 2022-06-07 NOTE — Telephone Encounter (Signed)
   Primary Cardiologist: Julien Nordmann, MD  Chart reviewed as part of pre-operative protocol coverage. Simple dental extractions and deep clearnings are considered low risk procedures per guidelines and generally do not require any specific cardiac clearance. It is also generally accepted that for simple extractions and dental cleanings, there is no need to interrupt blood thinner therapy.   SBE prophylaxis is not required for the patient.  I will route this recommendation to the requesting party via Epic fax function and remove from pre-op pool.  Please call with questions.  Levi Aland, NP-C  06/07/2022, 2:09 PM [address], [address] Office [phone] Fax [phone]

## 2022-06-07 NOTE — Telephone Encounter (Signed)
   Pre-operative Risk Assessment    Patient Name: Willie Hess  DOB: 11/06/1965 MRN: [id number]{     Request for Surgical Clearance{  Procedure:   DENTAL CLEANING/RADIOGRAPHS/FILLINGS/CROWNS/BRIDGES SURGERY:  Clearance 06/07/22                                Surgeon:  NOT INDICATED Surgeon's Group or Practice Name:  [facility] Phone number:  [phone] Fax number:  [phone]   Type of Clearance Requested:   - Medical    Type of Anesthesia:  Local WITH EPINEPHRINE   Additional requests/questions:    Willie Hess   06/07/2022, 11:09 AM

## 2022-06-20 ENCOUNTER — Telehealth: Payer: Self-pay | Admitting: Cardiovascular Disease

## 2022-06-20 MED ORDER — EMPAGLIFLOZIN 10 MG PO TABS: 10.0000 mg | ORAL_TABLET | Freq: Every day | ORAL | 0 refills | Status: DC

## 2022-06-20 NOTE — Telephone Encounter (Signed)
*  STAT* If patient is at the pharmacy, call can be transferred to refill team.   1. Which medications need to be refilled? (please list name of each medication and dose if known) Jardiance  2. Which pharmacy/location (including street and city if local pharmacy) is medication to be sent to?[facility] [place]  3. Do they need a 30 day or 90 day supply? 90 days and refills

## 2022-06-20 NOTE — Telephone Encounter (Signed)
Requested Prescriptions   Signed Prescriptions Disp Refills   empagliflozin (JARDIANCE) 10 MG TABS tablet 90 tablet 0    Sig: Take 1 tablet (10 mg total) by mouth daily before breakfast.    Authorizing Provider: GOLLAN, TIMOTHY J    Ordering User: NEWCOMER MCCLAIN, Debar Plate L    

## 2022-06-24 ENCOUNTER — Encounter: Payer: Self-pay | Admitting: *Deleted

## 2022-06-24 ENCOUNTER — Other Ambulatory Visit (HOSPITAL_COMMUNITY): Payer: Self-pay

## 2022-06-24 ENCOUNTER — Other Ambulatory Visit: Payer: Self-pay

## 2022-06-24 ENCOUNTER — Telehealth: Payer: Self-pay | Admitting: Cardiovascular Disease

## 2022-06-24 DIAGNOSIS — Z006 Encounter for examination for normal comparison and control in clinical research program: Secondary | ICD-10-CM

## 2022-06-24 MED ORDER — EMPAGLIFLOZIN 10 MG PO TABS: 10.0000 mg | ORAL_TABLET | Freq: Every day | ORAL | 0 refills | Status: DC

## 2022-06-24 NOTE — Telephone Encounter (Signed)
Requested Prescriptions   Signed Prescriptions Disp Refills   empagliflozin (JARDIANCE) 10 MG TABS tablet 90 tablet 0    Sig: Take 1 tablet (10 mg total) by mouth daily before breakfast.    Authorizing Provider: Antonieta Iba    Ordering User: Thayer Headings, Saraia Platner L   Patient informed that refill must go through [facility] or he would have to pay full price. Patient agreed to have Rx sent to mail order pharmacy.

## 2022-06-24 NOTE — Telephone Encounter (Signed)
Hi Nigel, Do we know if patient has prescription drug coverage? Nothing is pulling in the [employer] and [employer] card on file is medical only.

## 2022-06-24 NOTE — Telephone Encounter (Signed)
Willie Hess! I just got off the phone with the patient who states he has prescription drug coverage through [facility] and has been able to fill it at little to no cost up until this time. He states nothing has changed with his insurance-they have made no changes to their plan.

## 2022-06-24 NOTE — Telephone Encounter (Addendum)
Also would like to note he is using their services already. When I was on the [facility] site it showed a different RX was recently shipped to the patient.

## 2022-06-24 NOTE — Research (Signed)
Spoke with Willie Hess. He states ok to send info about V1P research. Emailed him a copy of the consent to read over. Encouraged him to call back with any questions.

## 2022-06-24 NOTE — Telephone Encounter (Signed)
Okay so yes patient has Willie Hess but the Willie Hess is only medical. Rx benefits are through [employer]. I needed the Rx numbers to bill a test claim. I was able to pull them from [employer].    Per test claim rejection plan only pays for a limited amount of refills at a retail pharmacy. Patient will have to get everything mail order eventually or be expected to pay the full cost of the medication.

## 2022-06-24 NOTE — Telephone Encounter (Signed)
Pt c/o medication issue:  1. Name of Medication:   empagliflozin (JARDIANCE) 10 MG TABS tablet    2. How are you currently taking this medication (dosage and times per day)?  As written   3. Are you having a reaction (difficulty breathing--STAT)? no  4. What is your medication issue? Pt spouse called in stating pt is completely out of these meds and pharmacy told him that its $2000, they are unable to pay that much. Please advise.

## 2022-06-25 ENCOUNTER — Other Ambulatory Visit: Payer: Self-pay | Admitting: Cardiovascular Disease

## 2022-06-28 ENCOUNTER — Ambulatory Visit: Payer: Managed Care, Other (non HMO) | Attending: Genetic Counselor | Admitting: Genetic Counselor

## 2022-06-28 DIAGNOSIS — I428 Other cardiomyopathies: Secondary | ICD-10-CM

## 2022-06-30 ENCOUNTER — Encounter: Payer: Self-pay | Admitting: *Deleted

## 2022-06-30 DIAGNOSIS — Z006 Encounter for examination for normal comparison and control in clinical research program: Secondary | ICD-10-CM

## 2022-06-30 NOTE — Research (Signed)
I called patient to follow-up with V!P study. Patient stated has not had time to look at consent. I told him enrollment will be ending on May 24,2024. He said he would review consent and let us know if he is interested.

## 2022-07-03 NOTE — Consult Note (Signed)
Pre Test Genetic Consult  Referring Provider: Arvilla Meres, MD    Referral Reason  Morse Fradette is referred for genetic consult and testing for Non-ischemic cardiomyopathy (NICM).  Personal Medical Information Kongmeng (III.4 on pedigree) is a very pleasant 57 year-old Caucasian gentleman who works in the parts department of [employer].  Seibert tells me that he recollects his father having heart disease with fluid accumulation in his abdominal area in his mid to late 27s. When he noticed that he was having a similar localized increase in his girth two years ago, he suspected that he may have a heart condition and reached out to his PCP. Upon having an abnormal EKG, he was referred to a cardiologist. Cardiac imaging studies detected reduced EF of 20-25% and was diagnosed with dilated cardiomyopathy. He underwent an ICD implantation in Feb 2024.  He reports having worsening symptoms for the last two years of fatigue and dyspnea upon exertion. Tells me of a syncope at age 56 when he blacked out after getting out of his [employer]. He tells me that he was playing music and had driven home without incident but felt woozy as soon as he got out of the [place] and sat down when he blacked out. He informed his cardiologist who then reduced his HTN medication.   Traditional Risk Factors Winslow denies having other cardiac or systemic conditions that can cause non-ischemic cardiomyopathy, namely, myocarditis, ischemic heart disease, infiltrative myocardial disease (amyloidosis, sarcoidosis, hemochromatosis), infection with HIV virus, connective tissue disease (such as systemic lupus erythematosus etc.). He also denies substance abuse, heart valve disease and doxorubicin therapy.   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    Family history  Relation to Proband Pedigree # Current age Heart condition/age of onset Notes  Daughter IV.6 19 None  Echo/EKG not done  Son IV.7 14  Heart murmur 2 birth, regular f/u Echo/EKG NOT DONE  Sisters, 2 III.1, III.5 72, 53 III.1- Heart valve replaced @ 70   Brothers, 2 III.2, III.3 64, 61 None   Nephews, nieces IV.1-IV.5, IV.8-IV.10 52-17 None IV.2- died of Wilm's tumor @ 21        Father II.2 Deceased Afib, DCM, CHF @ 68 Died @ 52 from kidney failure  Paternal uncle II.1 Deceased DCM @ 65s Died @ 83 from CHF  Paternal grandfather 1.1 Deceased None Died @ 35 from emphysema, heavy smoker, COPD  Paternal grandmother I.2 Deceased Cardiac issues Died @ 17s from cardiac issues        Mother II.3 80 None   Maternal uncles, 6 II.4-II.9 Deceased None Died in 71s/90sa from old age, emphysema, brain cancer  Maternal aunts, 2 II.10-II.11 Deceased None Died in 43s/90s from old age  Maternal grandfather I.3 Deceased None Died @ 67s from natural causes  Maternal grandmother I.4 Deceased None Died @ 30- old age    Pre-test Genetic Consult notes  I reviewed the different genetic cardiomyopathies that are considered non-ischemic cardiomyopathy, namely ARVC, DCM, HCM and LVNC. I discussed the genetics of HCM, DCM, LVNC and ARVC, namely inheritance, incomplete penetrance, variable expression, and digenic/compound mutations that can be seen in some patients.   We walked through the process of genetic testing. I explained to him that there are three possible outcomes of genetic testing; namely positive, negative, and finding a variant of unknown significance. A positive outcome can be expected in cases that do not have risk factors for a cardiomyopathy, present early  in life with increased severity and have a family history of sudden cardiac death and/or a relative that has been diagnosed with cardiomyopathy. Limitations in current genetic testing methodology can produce a negative result. Variants of unknown significance (VUS) are also observed. I explained to him that typically a VUS is so classified if the variant is not well understood as very few  individuals have been reported to harbor this variant or its role in gene function has not been elucidated. The potential outcomes of genetic testing and subsequent management of at-risk family members were discussed to manage expectations.   Impression  In summary, Man's early age and severity of clinical presentation along with family history of DCM at a young age and CHF in father and paternal uncle is indicative of a genetic condition. However, he does have a past history of heavy alcohol consumption that confounds his clinical presentation.   Genetic testing for genes implicated in nonischemic cardiomyopathy is highly recommended to confirm his diagnosis. The test will determine the underlying genetic basis of his condition and stratify risk of NICM in his family.   In addition, we discussed the protections afforded by the Genetic Information Non-Discrimination Act (GINA). I explained to him that GINA protects him from losing his employment or health insurance based on his genotype. However, these protections do not cover life insurance and disability. He tells me that his children have life insurance.  Please note that the patient has not been counseled in this visit on personal, cultural, or ethical issues that he may face due to his heart condition.   Plan After a thorough discussion of the risk and benefits of genetic testing for NICM, Harsh states his intent to pursue genetic testing for NICM. However, he would like to know his insurance out of pocket costs prior to testing and would like to use [facility] as his wife works for this lab. We will provide him the information and facilitate his blood draw for genetic testing at [facility].  Sidney Ace, Ph.D, [facility] Clinical Molecular Geneticist

## 2022-07-11 NOTE — Progress Notes (Unsigned)
  Electrophysiology Office Follow up Visit Note:    Date:  07/13/2022   ID:  Willie Hess, DOB 02/03/1966, MRN [id number]  PCP:  Berniece Salines, FNP  [facility] Cardiologist:  Julien Nordmann, MD  [facility] Electrophysiologist:  Lanier Prude, MD    Interval History:    Willie Hess is a 57 y.o. male who presents for a follow up visit.   He had an ICD implanted April 06, 2022 [facility] Scientific VVI ICD).  He has done well since implant.  Remote monitoring has shown stable device function.      Past medical, surgical, social and family history were reviewed.  ROS:   Please see the history of present illness.    All other systems reviewed and are negative.  EKGs/Labs/Other Studies Reviewed:    The following studies were reviewed today:  Jul 13, 2022 in clinic device interrogation personally reviewed Battery longevity 14.5 years Lead parameter stable No high-voltage therapies Several episodes of NSVT with ventricular rates 1 90-200 No programming changes made today   Physical Exam:    VS:  BP 108/76   Pulse 95   Ht 6' 1.5" (1.867 m)   Wt 223 lb (101.2 kg)   SpO2 94%   BMI 29.02 kg/m     Wt Readings from Last 3 Encounters:  07/13/22 223 lb (101.2 kg)  05/02/22 222 lb 2 oz (100.8 kg)  04/27/22 216 lb 1.6 oz (98 kg)     GEN:  Well nourished, well developed in no acute distress CARDIAC: RRR, no murmurs, rubs, gallops.  ICD pocket well-healed RESPIRATORY:  Clear to auscultation without rales, wheezing or rhonchi       ASSESSMENT:    1. HFrEF (heart failure with reduced ejection fraction) (HCC)   2. NICM (nonischemic cardiomyopathy) (HCC)   3. Implantable cardioverter-defibrillator (ICD) in situ    PLAN:    In order of problems listed above:  #Chronic systolic heart failure #Nonischemic cardiomyopathy NYHA class II.  Warm and dry on exam.  Continue GDMT.  #ICD in situ Device functioning appropriately.  Continue remote  monitoring.  Follow-up 1 year with APP.     Signed, Steffanie Dunn, MD, [facility], [facility] 07/13/2022 1:30 PM    Electrophysiology [facility]

## 2022-07-12 ENCOUNTER — Ambulatory Visit (INDEPENDENT_AMBULATORY_CARE_PROVIDER_SITE_OTHER): Payer: Managed Care, Other (non HMO)

## 2022-07-12 DIAGNOSIS — I509 Heart failure, unspecified: Secondary | ICD-10-CM

## 2022-07-12 DIAGNOSIS — I428 Other cardiomyopathies: Secondary | ICD-10-CM

## 2022-07-13 ENCOUNTER — Encounter: Payer: Self-pay | Admitting: Cardiology

## 2022-07-13 ENCOUNTER — Ambulatory Visit: Payer: Managed Care, Other (non HMO) | Attending: Cardiology | Admitting: Cardiology

## 2022-07-13 VITALS — BP 108/76 | HR 95 | Ht 73.5 in | Wt 223.0 lb

## 2022-07-13 DIAGNOSIS — Z95810 Presence of automatic (implantable) cardiac defibrillator: Secondary | ICD-10-CM

## 2022-07-13 DIAGNOSIS — I5020 Unspecified systolic (congestive) heart failure: Secondary | ICD-10-CM | POA: Diagnosis not present

## 2022-07-13 DIAGNOSIS — I428 Other cardiomyopathies: Secondary | ICD-10-CM

## 2022-07-13 LAB — CUP PACEART REMOTE DEVICE CHECK
Battery Remaining Longevity: 180 mo
Battery Remaining Percentage: 100 %
Brady Statistic RV Percent Paced: 0 %
Date Time Interrogation Session: 20240528011000
HighPow Impedance: 78 Ohm
Implantable Lead Connection Status: 753985
Implantable Lead Implant Date: 20240221
Implantable Lead Location: 753860
Implantable Lead Model: 673
Implantable Lead Serial Number: 216708
Implantable Pulse Generator Implant Date: 20240221
Lead Channel Impedance Value: 928 Ohm
Lead Channel Pacing Threshold Amplitude: 0.6 V
Lead Channel Pacing Threshold Pulse Width: 0.4 ms
Lead Channel Setting Pacing Amplitude: 3.5 V
Lead Channel Setting Pacing Pulse Width: 0.4 ms
Lead Channel Setting Sensing Sensitivity: 0.6 mV
Pulse Gen Serial Number: 321783
Zone Setting Status: 755011

## 2022-07-13 LAB — CUP PACEART INCLINIC DEVICE CHECK
Date Time Interrogation Session: 20240529123942
HighPow Impedance: 77 Ohm
Implantable Lead Connection Status: 753985
Implantable Lead Implant Date: 20240221
Implantable Lead Location: 753860
Implantable Lead Model: 673
Implantable Lead Serial Number: 216708
Implantable Pulse Generator Implant Date: 20240221
Lead Channel Impedance Value: 944 Ohm
Lead Channel Pacing Threshold Amplitude: 0.6 V
Lead Channel Pacing Threshold Pulse Width: 0.4 ms
Lead Channel Sensing Intrinsic Amplitude: 12.8 mV
Lead Channel Setting Pacing Amplitude: 2 V
Lead Channel Setting Pacing Pulse Width: 0.4 ms
Lead Channel Setting Sensing Sensitivity: 0.6 mV
Pulse Gen Serial Number: 321783
Zone Setting Status: 755011

## 2022-07-13 NOTE — Patient Instructions (Signed)
Medication Instructions:  Your physician recommends that you continue on your current medications as directed. Please refer to the Current Medication list given to you today.  *If you need a refill on your cardiac medications before your next appointment, please call your pharmacy*  Follow-Up: At [facility], you and your health needs are our priority.  As part of our continuing mission to provide you with exceptional heart care, we have created designated Provider Care Teams.  These Care Teams include your primary Cardiologist (physician) and Advanced Practice Providers (APPs -  Physician Assistants and Nurse Practitioners) who all work together to provide you with the care you need, when you need it.  Your next appointment:   1 year(s)  Provider:   You will see one of the following Advanced Practice Providers on your designated Care Team:   Renee Ursuy, PA-C Michael "Andy" Tillery, PA-C Suzann Riddle, NP  

## 2022-07-25 ENCOUNTER — Other Ambulatory Visit: Payer: Self-pay | Admitting: Family Medicine

## 2022-07-25 DIAGNOSIS — R051 Acute cough: Secondary | ICD-10-CM

## 2022-07-25 DIAGNOSIS — J329 Chronic sinusitis, unspecified: Secondary | ICD-10-CM

## 2022-07-26 ENCOUNTER — Other Ambulatory Visit: Payer: Self-pay

## 2022-07-26 DIAGNOSIS — J329 Chronic sinusitis, unspecified: Secondary | ICD-10-CM

## 2022-07-26 DIAGNOSIS — R051 Acute cough: Secondary | ICD-10-CM

## 2022-07-27 MED ORDER — LEVOCETIRIZINE DIHYDROCHLORIDE 5 MG PO TABS: ORAL_TABLET | ORAL | 1 refills | Status: AC

## 2022-08-08 NOTE — Progress Notes (Signed)
Remote ICD transmission.   

## 2022-08-21 ENCOUNTER — Other Ambulatory Visit: Payer: Self-pay | Admitting: Cardiovascular Disease

## 2022-08-22 ENCOUNTER — Ambulatory Visit (INDEPENDENT_AMBULATORY_CARE_PROVIDER_SITE_OTHER): Payer: Managed Care, Other (non HMO) | Admitting: Adult Health

## 2022-08-22 ENCOUNTER — Encounter: Payer: Self-pay | Admitting: Adult Health

## 2022-08-22 VITALS — BP 136/78 | HR 100 | Temp 98.1°F | Ht 74.0 in | Wt 218.8 lb

## 2022-08-22 DIAGNOSIS — G4733 Obstructive sleep apnea (adult) (pediatric): Secondary | ICD-10-CM | POA: Diagnosis not present

## 2022-08-22 NOTE — Assessment & Plan Note (Signed)
Encouraged on increasing daily usage 6 or more hours each night.  Patient occasion given on sleep apnea.  - discussed how weight can impact sleep and risk for sleep disordered breathing - discussed options to assist with weight loss: combination of diet modification, cardiovascular and strength training exercises   - had an extensive discussion regarding the adverse health consequences related to untreated sleep disordered breathing - specifically discussed the risks for hypertension, coronary artery disease, cardiac dysrhythmias, cerebrovascular disease, and diabetes - lifestyle modification discussed   - discussed how sleep disruption can increase risk of accidents, particularly when driving - safe driving practices were discussed   Plan  Patient Instructions  Continue on CPAP At bedtime, wear all night long and with naps.  Saline nasal spray Twice daily  Saline nasal gel At bedtime   Work on healthy weight  Do not drive if sleepy  Follow up in 1 year and As needed.

## 2022-08-22 NOTE — Patient Instructions (Addendum)
Continue on CPAP At bedtime, wear all night long and with naps.  Saline nasal spray Twice daily  Saline nasal gel At bedtime   Work on healthy weight  Do not drive if sleepy  Follow up in 1 year and As needed.

## 2022-08-22 NOTE — Progress Notes (Signed)
@Patient  ID: Willie Hess, male    DOB: 01/07/1966, 57 y.o.   MRN: [id number]  Chief Complaint  Patient presents with   Follow-up    Referring provider: Berniece Salines, FNP  HPI: 57 year old male former smoker seen for sleep consult April 29, 2021 for restless sleep and snoring found to have severe obstructive sleep apnea Medical history significant for congestive heart failure and nonischemic cardiomyopathy diagnosed in April 2023 (EF 20 to 25%), prediabetes Participates in the lung cancer screening program.  TEST/EVENTS :  Home sleep study done May 19, 2021 AHI 57.8/hour SPO2 low at 82%   Echo on May 13, 2021 showed EF at 20 to 25%, global hypokinesis, grade 1 diastolic dysfunction.   Cardiac cath on April 11 that showed mild nonobstructive coronary artery disease consistent with nonischemic cardiomyopathy and moderately elevated right heart/pulmonary artery pressures.  And severely elevated left heart pressure.  08/22/2022 Follow up : OSA  Patient returns for a 1 year follow-up.  Patient has underlying severe sleep apnea is on nocturnal CPAP.  Patient says he tries to wear his CPAP machine each night.  Usually gets in about 5 to 6 hours of sleep.  Patient feels that he benefits from CPAP.  CPAP download shows 90% compliance.  Daily average usage of 5 hours.  Patient is on auto CPAP 5 to 15 cm H2O.  AHI is 0.8/hour.  Daily average pressure at 9.9 cm H2O.    Allergies  Allergen Reactions   Bee Venom Swelling    Immunization History  Administered Date(s) Administered   Influenza,inj,Quad PF,6+ Mos 12/11/2014, 10/27/2015, 10/11/2016, 10/07/2019   Tdap 10/11/2016    Past Medical History:  Diagnosis Date   Arthritis    Benign neoplasm of ascending colon    Benign neoplasm of cecum    Benign neoplasm of sigmoid colon    Benign neoplasm of transverse colon    Chronic HFrEF (heart failure with reduced ejection fraction) (HCC)    a. 04/2021 Echo: EF 20-25%.   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 04/2021 Echo: EF 20-25%, glob HK, GrI DD, nl RV fxn, mod dil LA, mild MR, triv AI; b. 05/2021 Cath: nonobs dzs. LVEDP 40, RA 12, RV 50/12, PA 50/30, PCWP 35, CO/CI 4.5/2.0.   Obstructive sleep apnea    Prediabetes 09/25/2017   Preventative health care 10/12/2015    Tobacco History: Social History   Tobacco Use  Smoking Status Former   Packs/day: 1.00   Years: 32.00   Additional pack years: 0.00   Total pack years: 32.00   Types: Cigarettes   Quit date: 2020   Years since quitting: 4.5  Smokeless Tobacco Never   Counseling given: Not Answered   Outpatient Medications Prior to Visit  Medication Sig Dispense Refill   empagliflozin (JARDIANCE) 10 MG TABS tablet Take 1 tablet (10 mg total) by mouth daily before breakfast. 90 tablet 0   ENTRESTO 24-26 MG TAKE 1 TABLET BY MOUTH TWICE  DAILY 180 tablet 0   furosemide (LASIX) 40 MG tablet TAKE 1 TABLET(40 MG) BY MOUTH TWICE DAILY 180 tablet 0   levocetirizine (XYZAL) 5 MG tablet TAKE 1 TABLET(5 MG) BY MOUTH EVERY EVENING 90 tablet 1  metoprolol succinate (TOPROL-XL) 25 MG 24 hr tablet Take 1.5 tablets (37.5 mg total) by mouth in the morning AND 2 tablets (50 mg total) every evening. 315 tablet 1   Omeprazole-Sodium Bicarbonate (ZEGERID) 20-1100 MG CAPS capsule Take 2 capsules by mouth daily.      spironolactone (ALDACTONE) 25 MG tablet Take 1 tablet (25 mg total) by mouth daily. 90 tablet 1   rosuvastatin (CRESTOR) 20 MG tablet Take 1 tablet (20 mg total) by mouth daily. 90 tablet 1   sacubitril-valsartan (ENTRESTO) 24-26 MG Take 1 tablet by mouth 2 (two) times daily. 180 tablet 3   No facility-administered medications prior to visit.     Review of Systems:   Constitutional:   No  weight loss, night  sweats,  Fevers, chills, fatigue, or  lassitude.  HEENT:   No headaches,  Difficulty swallowing,  Tooth/dental problems, or  Sore throat,                No sneezing, itching, ear ache, nasal congestion, post nasal drip,   CV:  No chest pain,  Orthopnea, PND, swelling in lower extremities, anasarca, dizziness, palpitations, syncope.   GI  No heartburn, indigestion, abdominal pain, nausea, vomiting, diarrhea, change in bowel habits, loss of appetite, bloody stools.   Resp: No shortness of breath with exertion or at rest.  No excess mucus, no productive cough,  No non-productive cough,  No coughing up of blood.  No change in color of mucus.  No wheezing.  No chest wall deformity  Skin: no rash or lesions.  GU: no dysuria, change in color of urine, no urgency or frequency.  No flank pain, no hematuria   MS:  No joint pain or swelling.  No decreased range of motion.  No back pain.    Physical Exam  BP 136/78 (BP Location: Left Arm, Cuff Size: Normal)   Pulse 100   Temp 98.1 F (36.7 C) (Temporal)   Ht 6\' 2"  (1.88 m)   Wt 218 lb 12.8 oz (99.2 kg)   SpO2 98%   BMI 28.09 kg/m   GEN: A/Ox3; pleasant , NAD, well nourished    HEENT:  [place]/AT,   NOSE-clear, THROAT-clear, no lesions, no postnasal drip or exudate noted.   NECK:  Supple w/ fair ROM; no JVD; normal carotid impulses w/o bruits; no thyromegaly or nodules palpated; no lymphadenopathy.    RESP  Clear  P & A; w/o, wheezes/ rales/ or rhonchi. no accessory muscle use, no dullness to percussion  CARD:  RRR, no m/r/g, no peripheral edema, pulses intact, no cyanosis or clubbing.  GI:   Soft & nt; nml bowel sounds; no organomegaly or masses detected.   Musco: Warm bil, no deformities or joint swelling noted.   Neuro: alert, no focal deficits noted.    Skin: Warm, no lesions or rashes    Lab Results:      BNP   ProBNP No results found for: "PROBNP"  Imaging: No results found.        No data to display           No results found for: "NITRICOXIDE"       Assessment & Plan:   OSA (obstructive sleep apnea) Encouraged on increasing daily usage 6 or more hours each night.  Patient occasion given on sleep apnea.  - discussed how weight can impact sleep and risk for sleep disordered breathing - discussed options to assist with weight loss: combination of diet modification, cardiovascular and strength  training exercises   - had an extensive discussion regarding the adverse health consequences related to untreated sleep disordered breathing - specifically discussed the risks for hypertension, coronary artery disease, cardiac dysrhythmias, cerebrovascular disease, and diabetes - lifestyle modification discussed   - discussed how sleep disruption can increase risk of accidents, particularly when driving - safe driving practices were discussed   Plan  Patient Instructions  Continue on CPAP At bedtime, wear all night long and with naps.  Saline nasal spray Twice daily  Saline nasal gel At bedtime   Work on healthy weight  Do not drive if sleepy  Follow up in 1 year and As needed.       Rubye Oaks, NP 08/22/2022

## 2022-08-25 ENCOUNTER — Other Ambulatory Visit: Payer: Self-pay | Admitting: Cardiovascular Disease

## 2022-09-23 ENCOUNTER — Other Ambulatory Visit: Payer: Self-pay | Admitting: Cardiovascular Disease

## 2022-09-23 NOTE — Telephone Encounter (Signed)
Pt is scheduled on 9/16

## 2022-09-23 NOTE — Telephone Encounter (Signed)
Hi,  Could you schedule a 6 month follow up visit? The patient was last seen by Dr. Mariah Milling on 05-02-22. Thank you so much.

## 2022-10-21 ENCOUNTER — Telehealth: Payer: Self-pay | Admitting: Cardiovascular Disease

## 2022-10-21 MED ORDER — SPIRONOLACTONE 25 MG PO TABS: 25.0000 mg | ORAL_TABLET | Freq: Every day | ORAL | 0 refills | Status: DC

## 2022-10-21 MED ORDER — FUROSEMIDE 40 MG PO TABS: 40.0000 mg | ORAL_TABLET | Freq: Two times a day (BID) | ORAL | 0 refills | Status: DC

## 2022-10-21 MED ORDER — ROSUVASTATIN CALCIUM 20 MG PO TABS: 20.0000 mg | ORAL_TABLET | Freq: Every day | ORAL | 0 refills | Status: DC

## 2022-10-21 MED ORDER — METOPROLOL SUCCINATE ER 25 MG PO TB24: ORAL_TABLET | ORAL | 0 refills | Status: DC

## 2022-10-21 NOTE — Telephone Encounter (Signed)
Requested Prescriptions   Signed Prescriptions Disp Refills   furosemide (LASIX) 40 MG tablet 180 tablet 0    Sig: Take 1 tablet (40 mg total) by mouth 2 (two) times daily.    Authorizing Provider: Antonieta Iba    Ordering User: Feliberto Harts L   metoprolol succinate (TOPROL-XL) 25 MG 24 hr tablet 315 tablet 0    Sig: Take 1.5 tablets (37.5 mg total) by mouth in the morning AND 2 tablets (50 mg total) every evening.    Authorizing Provider: Antonieta Iba    Ordering User: Guerry Minors   rosuvastatin (CRESTOR) 20 MG tablet 90 tablet 0    Sig: Take 1 tablet (20 mg total) by mouth daily.    Authorizing Provider: Antonieta Iba    Ordering User: Guerry Minors   spironolactone (ALDACTONE) 25 MG tablet 90 tablet 0    Sig: Take 1 tablet (25 mg total) by mouth daily.    Authorizing Provider: Antonieta Iba    Ordering User: Guerry Minors

## 2022-10-21 NOTE — Telephone Encounter (Signed)
*  STAT* If patient is at the pharmacy, call can be transferred to refill team.   1. Which medications need to be refilled? (please list name of each medication and dose if known)   metoprolol succinate (TOPROL-XL) 25 MG 24 hr tablet  furosemide (LASIX) 40 MG tablet  spironolactone (ALDACTONE) 25 MG tablet  rosuvastatin (CRESTOR) 20 MG tablet (Expired)   2. Would you like to learn more about the convenience, safety, & potential cost savings by using the [facility]?   3. Are you open to using the [facility] (Type [facility]. ).  4. Which pharmacy/location (including street and city if local pharmacy) is medication to be sent to?  [id number] - [place], [place] - [address] AT [facility] [address]   5. Do they need a 30 day or 90 day supply?   90 day    Wife (Lauren) stated patient still has some medication.

## 2022-10-23 ENCOUNTER — Other Ambulatory Visit: Payer: Self-pay | Admitting: Cardiovascular Disease

## 2022-10-31 ENCOUNTER — Ambulatory Visit: Payer: Managed Care, Other (non HMO) | Admitting: Cardiology

## 2022-11-13 ENCOUNTER — Other Ambulatory Visit: Payer: Self-pay | Admitting: Cardiovascular Disease

## 2022-11-23 ENCOUNTER — Ambulatory Visit: Payer: Managed Care, Other (non HMO) | Attending: Cardiology | Admitting: Cardiology

## 2022-11-23 ENCOUNTER — Encounter: Payer: Self-pay | Admitting: Cardiology

## 2022-11-23 VITALS — BP 126/88 | HR 96 | Ht 74.0 in | Wt 221.4 lb

## 2022-11-23 DIAGNOSIS — I428 Other cardiomyopathies: Secondary | ICD-10-CM | POA: Diagnosis not present

## 2022-11-23 DIAGNOSIS — Z95810 Presence of automatic (implantable) cardiac defibrillator: Secondary | ICD-10-CM | POA: Diagnosis not present

## 2022-11-23 DIAGNOSIS — I5020 Unspecified systolic (congestive) heart failure: Secondary | ICD-10-CM | POA: Diagnosis not present

## 2022-11-23 DIAGNOSIS — I4729 Other ventricular tachycardia: Secondary | ICD-10-CM

## 2022-11-23 DIAGNOSIS — G4733 Obstructive sleep apnea (adult) (pediatric): Secondary | ICD-10-CM

## 2022-11-23 DIAGNOSIS — Z79899 Other long term (current) drug therapy: Secondary | ICD-10-CM

## 2022-11-23 DIAGNOSIS — I2510 Atherosclerotic heart disease of native coronary artery without angina pectoris: Secondary | ICD-10-CM

## 2022-11-23 DIAGNOSIS — E782 Mixed hyperlipidemia: Secondary | ICD-10-CM

## 2022-11-23 MED ORDER — TORSEMIDE 20 MG PO TABS: 20.0000 mg | ORAL_TABLET | Freq: Two times a day (BID) | ORAL | 3 refills | Status: DC

## 2022-11-23 NOTE — Progress Notes (Unsigned)
  Cardiology Office Note:  .   Date:  11/23/2022  ID:  Willie Hess, DOB [birth year], MRN [id number] PCP: Berniece Salines, FNP  [facility] Providers Cardiologist:  Julien Nordmann, MD Electrophysiologist:  Lanier Prude, MD { Click to update primary MD,subspecialty MD or APP then REFRESH:1}   History of Present Illness: .   Willie Hess is a 57 y.o. male with a past medical history of nonobstructive coronary disease, nonischemic cardiomyopathy, chronic HFrEF (EF 20 to 25% 04/2021), hyperlipidemia, prediabetes, GERD, severe OSA, nonsustained ventricular tachycardia any specific, ICD implantation (04/06/2022), and prior tobacco abuse (quit 2021), who is here today for follow-up.  He was initially evaluated in March 2023 in the setting of abnormal EKG with LVH and strong family history of CAD and heart failure.  He had complaints of fatigue with associated dyspnea on exertion and angina.  Echocardiogram revealed severely reduced LV function with an EF of 20-25% and global hypokinesis.  With this finding underwent a diagnostic catheterization which showed mild, nonobstructive LAD disease otherwise normal coronary arteries with elevated filling pressures with an LVEDP of 40, wedge 35, normal cardiac output index.  He was treated with GDMT.  ROS: ***  Studies Reviewed: Marland Kitchen   EKG Interpretation Date/Time:  Wednesday November 23 2022 09:27:36 EDT Ventricular Rate:  96 PR Interval:  156 QRS Duration:  100 QT Interval:  362 QTC Calculation: 457 R Axis:   -24  Text Interpretation: Normal sinus rhythm ST & T wave abnormality, consider lateral ischemia When compared with ECG of 06-Apr-2022 08:55, PREVIOUS ECG IS PRESENT Confirmed by Charlsie Quest ([id number]) on 11/23/2022 9:33:39 AM    *** Risk Assessment/Calculations:   {Does this patient have ATRIAL FIBRILLATION?:[phone]}         Physical Exam:   VS:  BP 126/88 (BP Location: Left Arm, Patient Position: Sitting, Cuff Size: Normal)   Pulse  96   Ht 6\' 2"  (1.88 m)   Wt 221 lb 6.4 oz (100.4 kg)   SpO2 97%   BMI 28.43 kg/m    Wt Readings from Last 3 Encounters:  11/23/22 221 lb 6.4 oz (100.4 kg)  08/22/22 218 lb 12.8 oz (99.2 kg)  07/13/22 223 lb (101.2 kg)    GEN: Well nourished, well developed in no acute distress NECK: No JVD; No carotid bruits CARDIAC: ***RRR, no murmurs, rubs, gallops RESPIRATORY:  Clear to auscultation without rales, wheezing or rhonchi  ABDOMEN: Soft, non-tender, non-distended EXTREMITIES:  No edema; No deformity   ASSESSMENT AND PLAN: .   ***    {Are you ordering a CV Procedure (e.g. stress test, cath, DCCV, TEE, etc)?   Press F2        :[id number]}  Dispo: ***  Signed, Tymar Polyak, NP

## 2022-11-23 NOTE — Patient Instructions (Signed)
Medication Instructions:  Your physician recommends the following medication changes.  STOP TAKING: Lasix   START TAKING: Torsemide 20 mg by mouth twice a day  *If you need a refill on your cardiac medications before your next appointment, please call your pharmacy*   Lab Work: Your provider would like for you to have labs drawn today (BMP), then return in 2 weeks (BMP).   Please go to [facility] [address] ([facility], [place] [address] You do not need an appointment.  They are open from 7:30 am-4 pm.  Lunch from 1:00 pm- 2:00 pm You will not need to be fasting.   Testing/Procedures: No test ordered today    Follow-Up: At [facility], you and your health needs are our priority.  As part of our continuing mission to provide you with exceptional heart care, we have created designated Provider Care Teams.  These Care Teams include your primary Cardiologist (physician) and Advanced Practice Providers (APPs -  Physician Assistants and Nurse Practitioners) who all work together to provide you with the care you need, when you need it.  We recommend signing up for the patient portal called "MyChart".  Sign up information is provided on this After Visit Summary.  MyChart is used to connect with patients for Virtual Visits (Telemedicine).  Patients are able to view lab/test results, encounter notes, upcoming appointments, etc.  Non-urgent messages can be sent to your provider as well.   To learn more about what you can do with MyChart, go to [email or web address].    Your next appointment:   6 week(s)  Provider:   You may see Julien Nordmann, MD or one of the following Advanced Practice Providers on your designated Care Team:   Charlsie Quest, NP

## 2022-11-24 LAB — BASIC METABOLIC PANEL
BUN/Creatinine Ratio: 10 (ref 9–20)
BUN: 10 mg/dL (ref 6–24)
CO2: 22 mmol/L (ref 20–29)
Calcium: 9.1 mg/dL (ref 8.7–10.2)
Chloride: 105 mmol/L (ref 96–106)
Creatinine, Ser: 1.05 mg/dL (ref 0.76–1.27)
Glucose: 93 mg/dL (ref 70–99)
Potassium: 3.4 mmol/L — ABNORMAL LOW (ref 3.5–5.2)
Sodium: 143 mmol/L (ref 134–144)
eGFR: 83 mL/min/{1.73_m2} (ref >59)

## 2022-11-28 NOTE — Progress Notes (Signed)
Potassium level remains a little low. Recommend starting Potassium Chloride 10 mEq daily with repeat BMP in two weeks with changing current diuretic therapy.

## 2022-12-02 ENCOUNTER — Other Ambulatory Visit: Payer: Self-pay

## 2022-12-02 DIAGNOSIS — Z79899 Other long term (current) drug therapy: Secondary | ICD-10-CM

## 2022-12-02 MED ORDER — POTASSIUM CHLORIDE ER 10 MEQ PO TBCR: 10.0000 meq | EXTENDED_RELEASE_TABLET | Freq: Every day | ORAL | 3 refills | Status: DC

## 2023-01-10 ENCOUNTER — Encounter: Payer: Self-pay | Admitting: Cardiology

## 2023-01-10 ENCOUNTER — Ambulatory Visit: Payer: Managed Care, Other (non HMO) | Attending: Cardiology | Admitting: Cardiology

## 2023-01-10 VITALS — BP 140/88 | HR 115 | Ht 74.0 in | Wt 218.4 lb

## 2023-01-10 DIAGNOSIS — Z95810 Presence of automatic (implantable) cardiac defibrillator: Secondary | ICD-10-CM | POA: Diagnosis not present

## 2023-01-10 DIAGNOSIS — I5020 Unspecified systolic (congestive) heart failure: Secondary | ICD-10-CM | POA: Diagnosis not present

## 2023-01-10 DIAGNOSIS — I4729 Other ventricular tachycardia: Secondary | ICD-10-CM | POA: Diagnosis not present

## 2023-01-10 DIAGNOSIS — I2510 Atherosclerotic heart disease of native coronary artery without angina pectoris: Secondary | ICD-10-CM

## 2023-01-10 DIAGNOSIS — I428 Other cardiomyopathies: Secondary | ICD-10-CM

## 2023-01-10 DIAGNOSIS — I10 Essential (primary) hypertension: Secondary | ICD-10-CM

## 2023-01-10 DIAGNOSIS — G4733 Obstructive sleep apnea (adult) (pediatric): Secondary | ICD-10-CM

## 2023-01-10 DIAGNOSIS — R000 Tachycardia, unspecified: Secondary | ICD-10-CM

## 2023-01-10 DIAGNOSIS — E782 Mixed hyperlipidemia: Secondary | ICD-10-CM

## 2023-01-10 NOTE — Patient Instructions (Addendum)
Medication Instructions:  - No changes *If you need a refill on your cardiac medications before your next appointment, please call your pharmacy*  Lab Work: Your provider would like for you to have following labs drawn today BMET.   If you have labs (blood work) drawn today and your tests are completely normal, you will receive your results only by: MyChart Message (if you have MyChart) OR A paper copy in the mail If you have any lab test that is abnormal or we need to change your treatment, we will call you to review the results.  Testing/Procedures: - None ordered  Follow-Up: At [facility], you and your health needs are our priority.  As part of our continuing mission to provide you with exceptional heart care, we have created designated Provider Care Teams.  These Care Teams include your primary Cardiologist (physician) and Advanced Practice Providers (APPs -  Physician Assistants and Nurse Practitioners) who all work together to provide you with the care you need, when you need it.  Your next appointment:   8 - 10 week(s)  Provider:   Charlsie Quest, NP    Other Instructions - None

## 2023-01-10 NOTE — Progress Notes (Signed)
Cardiology Office Note:  .   Date:  01/10/2023  ID:  Willie Hess, DOB 09/28/1965, MRN [id number] PCP: Berniece Salines, FNP  [facility] Providers Cardiologist:  Julien Nordmann, MD Electrophysiologist:  Lanier Prude, MD    History of Present Illness: .   Willie Hess is a 57 y.o. male with past medical history of nonobstructive coronary disease, nonischemic cardiomyopathy, chronic HFrEF (EF 20-25% 04/2021), hyperlipidemia, prediabetes, GERD, severe OSA, nonsustained ventricular tachycardia with ICD implantation (04/06/2022), prior tobacco use (quit 2021), who is here today for follow-up.   Initially evaluated in March 2023 Abnormal EKG with LVH and a strong family history of CAD and heart failure.  He had complaints of fatigue and associated dyspnea on exertion with angina.  Echocardiogram revealed severely reduced LV function with an EF of 20 to 25% and global hypokinesis.  Underwent diagnostic catheterization which showed mild, nonobstructive LAD disease, otherwise normal coronary arteries with elevated filling pressure with an LVEDP of 40, wedge of 35, normal cardiac output and index.  He was treated with GDMT.  Cardiac MRI in December 2023 showed an EF of 25% with no LGE.  He was referred to EP for ICD implantation.  ICD implant February 2024 without postoperative complications.  Remote monitoring of his ICD continue to show stable device function.   He was last seen in clinic 11/23/2022.  At that time he stated he was doing well.  He was having occasional shortness of breath and some abdominal bloating and swelling.  He stated when he started taking furosemide the swelling did diminish but as of late he noticed an increase in his weight and the swelling.  This is not a medication is not working as it previously had.  His furosemide was changed to torsemide.  He was scheduled for labs.  Encouraged to maintain follow-up with advanced heart failure and EP.  He returns to clinic today  stating that he has been rushing this morning causing elevation in his blood pressure and his heart rate.  States that since changing from furosemide to torsemide he has not noted an increase in urination and had a drop of several pounds in his weight.  He continues to have complaints of abdominal swelling.  Denies any early satiety, shortness of breath, chest pain, or swelling to his bilateral lower extremities.  States that he has been compliant with his current medication regimen without any adverse effects.  Has noted some dietary indiscretions with eating out and continuing to use salt on daily foods.  Denies any hospitalizations or visits to the emergency department.  ROS: 10 point review of systems has been reviewed and considered negative with exception what is been listed in the HPI  Studies Reviewed: Marland Kitchen   EKG Interpretation Date/Time:  Tuesday January 10 2023 10:17:18 EST Ventricular Rate:  115 PR Interval:  138 QRS Duration:  100 QT Interval:  348 QTC Calculation: 481 R Axis:   -25  Text Interpretation: Sinus tachycardia ST & T wave abnormality, consider lateral ischemia When compared with ECG of 23-Nov-2022 09:27, No significant change was found Confirmed by Charlsie Quest ([id number]) on 01/10/2023 10:19:12 AM    TTE 01/03/22 1. Left ventricular ejection fraction, by estimation, is 25 to 30%. The  left ventricle has severely decreased function. The left ventricle  demonstrates global hypokinesis. The left ventricular internal cavity size  was moderately dilated. Left  ventricular diastolic parameters are consistent with Grade II diastolic  dysfunction (pseudonormalization). The average left ventricular  global  longitudinal strain is -9.1 %. The global longitudinal strain is abnormal.   2. Right ventricular systolic function is normal. The right ventricular  size is normal. Tricuspid regurgitation signal is inadequate for assessing  PA pressure.   3. Left atrial size was mildly  dilated.   4. The mitral valve is normal in structure. Trivial mitral valve  regurgitation. No evidence of mitral stenosis.   5. The aortic valve is normal in structure. Aortic valve regurgitation is  trivial. Aortic valve sclerosis/calcification is present, without any  evidence of aortic stenosis.   6. Mildly dilated pulmonary artery.   7. The inferior vena cava is normal in size with greater than 50%  respiratory variability, suggesting right atrial pressure of 3 mmHg.    [facility] 05/2021   Mid LAD lesion is 20% stenosed.   LV end diastolic pressure is severely elevated.   There is no aortic valve stenosis.   Conclusions: Mild, non-obstructive coronary artery disease with 20% mid LAD stenosis.  No significant disease noted in the LCx or RCA.  Findings are consistent with non-ischemic cardiomyopathy. Severely elevated left heart filling pressures. Moderately elevated right heart and pulmonary artery pressures. Mildly-moderately reduced Fick cardiac output/index.   Recommendations: Initiate diuresis and goal directed medical therapy with metoprolol succinate 12.5 mg daily and losartan 25 mg daily.  Further optimization of GDMT per Dr. Mariah Milling at follow-up. Primary prevention of coronary artery disease. Risk Assessment/Calculations:     HYPERTENSION CONTROL Vitals:   01/10/23 1012 01/10/23 1027  BP: (!) 142/92 (!) 140/88    The patient's blood pressure is elevated above target today.  In order to address the patient's elevated BP: Blood pressure will be monitored at home to determine if medication changes need to be made.          Physical Exam:   VS:  BP (!) 140/88 (BP Location: Left Arm, Patient Position: Sitting, Cuff Size: Normal)   Pulse (!) 115   Ht 6\' 2"  (1.88 m)   Wt 218 lb 6.4 oz (99.1 kg)   SpO2 97%   BMI 28.04 kg/m    Wt Readings from Last 3 Encounters:  01/10/23 218 lb 6.4 oz (99.1 kg)  11/23/22 221 lb 6.4 oz (100.4 kg)  08/22/22 218 lb 12.8 oz (99.2 kg)     GEN: Well nourished, well developed in no acute distress NECK: No JVD; No carotid bruits CARDIAC: RRR, tachycardic, no murmurs, rubs, gallops RESPIRATORY:  Clear to auscultation without rales, wheezing or rhonchi  ABDOMEN: Soft, non-tender, distended EXTREMITIES:  No edema; No deformity   ASSESSMENT AND PLAN: .   Chronic HFrEF/NICM with an EF of 20-25% by echocardiogram and nonobstructive coronary artery disease on right and left heart catheterization.  ICD inserted 03/2022.  Recently changed from furosemide to torsemide due to abdominal bloating and weight gain.  Weight is down 3 pounds since medication changes were made.  He did not have previous labs done as ordered and with BMP completed today.  He is also continued on GDMT of Jardiance 10 mg daily, Toprol-XL 37.5 mg in the a.m. and 50 mg in the p.m., Entresto 24/26 mg twice daily, spironolactone 25 mg daily and torsemide 20 mg daily.  Will order follow-up echocardiogram on return.  He has been encouraged to continue with his follow-ups with advanced heart failure clinic.  Nonobstructive coronary artery disease heart catheterization without complaints of angina.  EKG today revealed sinus tachycardia with a rate of 115 with left axis deviation and  ST-T wave changes that are unchanged from prior study.  He is continued on aspirin 81 mg daily rosuvastatin 20 mg daily.  Hypertension with a blood pressure 142/92 today recheck was 140/88.  Patient stating that he was rushing to get to his appointment today.  Medications have remained the same today.  He is encouraged to monitor his pressure 1 to 2 hours postmedication administration as well.  Sinus tachycardia noted on EKG today as patient stating that he was rushing to get here.  He has had his beta-blocker this morning.  Is encouraged to continue to monitor his heart rate with his blood pressure readings.  Hyperlipidemia with an LDL of 61.  He is continued on rosuvastatin 20 mg daily.  Consider  increasing rosuvastatin on return or adding ezetimibe 10 mg daily.  Nonsustained ventricular tachycardia previously where he is continued on beta-blocker therapy of 37.5 mg in the morning and 50 mg in the evening.  Occasional PVCs on remote monitoring noted.  Continues to follow-up with the EP.  ICD in situ with device functioning appropriately.  He continues with remote monitoring and follow-ups with EP.  Obstructive sleep apnea on CPAP with continued compliance encouraged.       Dispo: Patient to return to clinic to see MD/APP in 8 to 10 weeks or sooner if needed for reevaluation of symptoms  Signed, Cathy Ropp, NP

## 2023-01-11 LAB — BASIC METABOLIC PANEL
BUN/Creatinine Ratio: 11 (ref 9–20)
BUN: 14 mg/dL (ref 6–24)
CO2: 24 mmol/L (ref 20–29)
Calcium: 9.7 mg/dL (ref 8.7–10.2)
Chloride: 99 mmol/L (ref 96–106)
Creatinine, Ser: 1.27 mg/dL (ref 0.76–1.27)
Glucose: 105 mg/dL — ABNORMAL HIGH (ref 70–99)
Potassium: 4.5 mmol/L (ref 3.5–5.2)
Sodium: 142 mmol/L (ref 134–144)
eGFR: 66 mL/min/{1.73_m2} (ref >59)

## 2023-01-11 NOTE — Progress Notes (Signed)
Potassium and kidney function remain stable. Continue current medication regimen without changes needed.

## 2023-02-01 ENCOUNTER — Telehealth: Payer: Self-pay | Admitting: Cardiovascular Disease

## 2023-02-01 MED ORDER — METOPROLOL SUCCINATE ER 25 MG PO TB24: ORAL_TABLET | ORAL | 0 refills | Status: DC

## 2023-02-01 MED ORDER — SPIRONOLACTONE 25 MG PO TABS: 25.0000 mg | ORAL_TABLET | Freq: Every day | ORAL | 0 refills | Status: DC

## 2023-02-01 NOTE — Telephone Encounter (Signed)
*  STAT* If patient is at the pharmacy, call can be transferred to refill team.   1. Which medications need to be refilled? (please list name of each medication and dose if known) metoprolol succinate (TOPROL-XL) 25 MG 24 hr tablet ; spironolactone (ALDACTONE) 25 MG tablet    2. Would you like to learn more about the convenience, safety, & potential cost savings by using the [facility]?     3. Are you open to using the [facility] (Type [facility]. ).   4. Which pharmacy/location (including street and city if local pharmacy) is medication to be sent to? [id number] - [place], [place] - [address] AT [facility] [address]    5. Do they need a 30 day or 90 day supply? 90

## 2023-02-01 NOTE — Telephone Encounter (Signed)
Requested Prescriptions   Signed Prescriptions Disp Refills   metoprolol succinate (TOPROL-XL) 25 MG 24 hr tablet 315 tablet 0    Sig: Take 1.5 tablets (37.5 mg total) by mouth in the morning AND 2 tablets (50 mg total) every evening.    Authorizing Provider: Antonieta Iba    Ordering User: Kendrick Fries   spironolactone (ALDACTONE) 25 MG tablet 90 tablet 0    Sig: Take 1 tablet (25 mg total) by mouth daily.    Authorizing Provider: Antonieta Iba    Ordering User: Kendrick Fries

## 2023-02-27 ENCOUNTER — Encounter: Payer: Self-pay | Admitting: Nurse Practitioner

## 2023-02-27 ENCOUNTER — Ambulatory Visit (INDEPENDENT_AMBULATORY_CARE_PROVIDER_SITE_OTHER): Payer: Managed Care, Other (non HMO) | Admitting: Nurse Practitioner

## 2023-02-27 ENCOUNTER — Ambulatory Visit: Payer: Self-pay

## 2023-02-27 VITALS — BP 120/68 | HR 108 | Temp 97.8°F | Resp 18 | Ht 74.0 in | Wt 223.5 lb

## 2023-02-27 DIAGNOSIS — M25551 Pain in right hip: Secondary | ICD-10-CM | POA: Diagnosis not present

## 2023-02-27 NOTE — Progress Notes (Signed)
 BP 120/68   Pulse (!) 108   Temp 97.8 F (36.6 C)   Resp 18   Ht 6' 2 (1.88 m)   Wt 223 lb 8 oz (101.4 kg)   SpO2 95%   BMI 28.70 kg/m    Subjective:    Patient ID: Willie Hess, male    DOB: Oct 02, 1965, 58 y.o.   MRN: [id number]  HPI: Willie Hess is a 58 y.o. male  Chief Complaint  Patient presents with   Hip Pain    And leg pain onset 3 days and worsening    Discussed the use of AI scribe software for clinical note transcription with the patient, who gave verbal consent to proceed.  History of Present Illness   The patient presents with right hip pain that started mildly on Saturday morning and has progressively worsened. Initially, the pain was only noticeable when sitting or turning a certain way while walking. By Sunday morning, the pain had increased and by Sunday night, the patient was having difficulty getting comfortable while sitting in their recliner. The patient found relief by pulling their legs up in the recliner in a cross-legged position. The pain worsened overnight, and by Monday morning, the patient had difficulty walking and getting out of bed. The pain is described as a knot in the front muscle of the hip, particularly when driving. The patient also reports a touch-sensitive spot on the hip bone. There is no reported trauma, fever, or recent illness. The patient recalls a similar pain a couple of weeks ago after playing drums and riding in the back of a truck, but the pain had not persisted.       02/27/2023    1:35 PM 04/27/2022    2:25 PM 03/25/2022    8:33 AM  Depression screen PHQ 2/9  Decreased Interest 0 0 0  Down, Depressed, Hopeless 0 0 0  PHQ - 2 Score 0 0 0  Altered sleeping 0 0   Tired, decreased energy 0 0   Change in appetite 0 0   Feeling bad or failure about yourself  0 0   Trouble concentrating 0 0   Moving slowly or fidgety/restless 0 0   Suicidal thoughts 0 0   PHQ-9 Score 0 0   Difficult doing work/chores Not difficult at all Not  difficult at all     Relevant past medical, surgical, family and social history reviewed and updated as indicated. Interim medical history since our last visit reviewed. Allergies and medications reviewed and updated.  Review of Systems  Ten systems reviewed and is negative except as mentioned in HPI      Objective:    BP 120/68   Pulse (!) 108   Temp 97.8 F (36.6 C)   Resp 18   Ht 6' 2 (1.88 m)   Wt 223 lb 8 oz (101.4 kg)   SpO2 95%   BMI 28.70 kg/m    Wt Readings from Last 3 Encounters:  02/27/23 223 lb 8 oz (101.4 kg)  01/10/23 218 lb 6.4 oz (99.1 kg)  11/23/22 221 lb 6.4 oz (100.4 kg)    Physical Exam Musculoskeletal:     Right hip: Tenderness present.     Constitutional: Patient appears well-developed and well-nourished.  No distress.  HEENT: head atraumatic, normocephalic, pupils equal and reactive to light, neck supple Cardiovascular: Normal rate, regular rhythm and normal heart sounds.  No murmur heard. No BLE edema. Pulmonary/Chest: Effort normal and breath sounds normal. No  respiratory distress. Abdominal: Soft.  There is no tenderness. Hip Musculoskeletal Exam Gait   Limp: right  Inspection   Leg length disparity: no discrepancy   Right     Erythema: none       Ecchymosis: none       Edema: none       Deformity: none    Palpation   Right     Increased warmth: none     Tenderness: present       Greater trochanteric region pain: moderate       Pubic rami pain: moderate       Lower lumbar region pain: none   Left       Left hip palpation is normal.  Range of Motion   Right     Active ROM: pain.       Passive ROM: pain.     Left     Left hip range of motion is within functional limits.   Neurovascular   Right       Right hip neurovascular exam is normal.   Left       Left hip neurovascular exam is normal.    Psychiatric: Patient has a normal mood and affect. behavior is normal. Judgment and thought content normal.   Results for  orders placed or performed in visit on 01/10/23  Basic Metabolic Panel (BMET)   Collection Time: 01/10/23 10:36 AM  Result Value Ref Range   Glucose 105 (H) 70 - 99 mg/dL   BUN 14 6 - 24 mg/dL   Creatinine, Ser 8.72 0.76 - 1.27 mg/dL   eGFR 66 >40 fO/fpw/8.26   BUN/Creatinine Ratio 11 9 - 20   Sodium 142 134 - 144 mmol/L   Potassium 4.5 3.5 - 5.2 mmol/L   Chloride 99 96 - 106 mmol/L   CO2 24 20 - 29 mmol/L   Calcium  9.7 8.7 - 10.2 mg/dL       Assessment & Plan:   Problem List Items Addressed This Visit   None Visit Diagnoses       Right hip pain    -  Primary        Assessment and Plan    Right Hip Pain Acute onset of severe right hip pain, No history of trauma or fever. Pain exacerbated by certain movements and positions. Tenderness over the right hip bone. -Refer to [facility] for immediate evaluation, possible x-ray, and potential injection therapy.        Follow up plan: Return if symptoms worsen or fail to improve.

## 2023-02-27 NOTE — Telephone Encounter (Signed)
  Chief Complaint: Right hip and leg pain. Symptoms: above Frequency: Saturday Pertinent Negatives: Patient denies fever, redness, swelling Disposition: [] ED /[] Urgent Care (no appt availability in office) / [x] Appointment(In office/virtual)/ []  [place] [facility]/ [] Home Care/ [] Refused Recommended Disposition /[] [place] [facility]/ []  Follow-up with PCP Additional Notes: Pt reports that pain started on Saturday and has gotten progressively worse. Pt states that it is a dull pain until he move a certain way, then pain becomes sharp and intense.  Appt for this afternoon in office.    Reason for Disposition  [1] SEVERE pain (e.g., excruciating, unable to do any normal activities) AND [2] not improved after 2 hours of pain medicine  Answer Assessment - Initial Assessment Questions 1. LOCATION and RADIATION: Where is the pain located?      Right hip - shoots down his leg/leg 2. QUALITY: What does the pain feel like?  (e.g., sharp, dull, aching, burning)     Dull pain, unitl he steps of turns then it becomes sharp 3. SEVERITY: How bad is the pain? What does it keep you from doing?   (Scale 1-10; or mild, moderate, severe)   -  MILD (1-3): doesn't interfere with normal activities    -  MODERATE (4-7): interferes with normal activities (e.g., work or school) or awakens from sleep, limping    -  SEVERE (8-10): excruciating pain, unable to do any normal activities, unable to walk     severe 4. ONSET: When did the pain start? Does it come and go, or is it there all the time?     Saturday 5. WORK OR EXERCISE: Has there been any recent work or exercise that involved this part of the body?      no 6. CAUSE: What do you think is causing the hip pain?      Unsure 7. AGGRAVATING FACTORS: What makes the hip pain worse? (e.g., walking, climbing stairs, running)     Certain movements. 8. OTHER SYMPTOMS: Do you have any other symptoms? (e.g., back pain, pain shooting down  leg,  fever, rash)     no  Protocols used: Hip Pain-A-AH

## 2023-03-06 NOTE — Progress Notes (Unsigned)
Cardiology Office Note:  .   Date:  03/07/2023  ID:  Willie Hess, DOB 04-16-1965, MRN [id number] PCP: Berniece Salines, FNP  [facility] Providers Cardiologist:  Julien Nordmann, MD Electrophysiologist:  Lanier Prude, MD    History of Present Illness: .   Willie Hess is a 58 y.o. male with a past medical history of nonobstructive coronary disease, nonischemic cardiomyopathy with chronic HFrEF (EF 20 to 25%, 04/2021), hyperlipidemia, prediabetes, GERD, severe OSA, nonsustained ventricular tachycardia with ICD implantation (04/06/2022), prior tobacco use (quit 2021), who is here today for follow-up.   Initially evaluated in March 2023 for abnormal EKG with LVH and a strong family history of CAD and heart failure.  He had complaints of fatigue and associated dyspnea on exertion with angina.  Echocardiogram revealed severely reduced LV function with an EF of 20 to 25% and global hypokinesis.  Underwent diagnostic catheterization which showed mild, nonobstructive LAD disease, otherwise normal coronary arteries with elevated filling pressure with an LVEDP of 40, wedge of 35, normal cardiac output and index.  He was treated with GDMT.  Cardiac MRI in December 2023 showed an EF of 25% with no LGE.  He was referred to EP for ICD implantation.  ICD implant February 2024 without postoperative complications.  Remote monitoring of his ICD continue to show stable device function.    Was last seen in clinic 01/10/2023.  States that since changing from furosemide to torsemide it noted an increase in urination and her dropped several pounds of weight.  He continued to have complaints of abdominal swelling.  Denied any other associated symptoms.  States he has been compliant with his current medication regimen.  Blood pressure and heart rate were slightly elevated.  He had blood work that was ordered. He was encouraged to continue to maintain all follow-up appointments with advanced heart failure.  He  returns to clinic today stating that he has been doing well. Denies any chest pain, shortness of breath, palpitations, or peripheral edema.  States that he has not noted a huge drop in his weight but has had roughly a 5 pound weight loss in the last several weeks.  States that he has been compliant with his current medications without any adverse side effects.  He has questions today regarding Ozempic, or Zepbound for potential weight loss.  Denies any hospitalizations or visits to the emergency department.  ROS: 10 point review of systems is reviewed and considered negative except what is been listed in the HPI  Studies Reviewed: Marland Kitchen        TTE 01/03/22 1. Left ventricular ejection fraction, by estimation, is 25 to 30%. The  left ventricle has severely decreased function. The left ventricle  demonstrates global hypokinesis. The left ventricular internal cavity size  was moderately dilated. Left  ventricular diastolic parameters are consistent with Grade II diastolic  dysfunction (pseudonormalization). The average left ventricular global  longitudinal strain is -9.1 %. The global longitudinal strain is abnormal.   2. Right ventricular systolic function is normal. The right ventricular  size is normal. Tricuspid regurgitation signal is inadequate for assessing  PA pressure.   3. Left atrial size was mildly dilated.   4. The mitral valve is normal in structure. Trivial mitral valve  regurgitation. No evidence of mitral stenosis.   5. The aortic valve is normal in structure. Aortic valve regurgitation is  trivial. Aortic valve sclerosis/calcification is present, without any  evidence of aortic stenosis.   6. Mildly dilated  pulmonary artery.   7. The inferior vena cava is normal in size with greater than 50%  respiratory variability, suggesting right atrial pressure of 3 mmHg.    [facility] 05/2021   Mid LAD lesion is 20% stenosed.   LV end diastolic pressure is severely elevated.   There is no  aortic valve stenosis.   Conclusions: Mild, non-obstructive coronary artery disease with 20% mid LAD stenosis.  No significant disease noted in the LCx or RCA.  Findings are consistent with non-ischemic cardiomyopathy. Severely elevated left heart filling pressures. Moderately elevated right heart and pulmonary artery pressures. Mildly-moderately reduced Fick cardiac output/index.   Recommendations: Initiate diuresis and goal directed medical therapy with metoprolol succinate 12.5 mg daily and losartan 25 mg daily.  Further optimization of GDMT per Dr. Mariah Milling at follow-up. Primary prevention of coronary artery disease. Risk Assessment/Calculations:             Physical Exam:   VS:  BP 120/82   Pulse 100   Ht 6\' 2"  (1.88 m)   Wt 218 lb (98.9 kg)   SpO2 99%   BMI 27.99 kg/m    Wt Readings from Last 3 Encounters:  03/07/23 218 lb (98.9 kg)  02/27/23 223 lb 8 oz (101.4 kg)  01/10/23 218 lb 6.4 oz (99.1 kg)    GEN: Well nourished, well developed in no acute distress NECK: No JVD; No carotid bruits CARDIAC: RRR, no murmurs, rubs, gallops RESPIRATORY:  Clear to auscultation without rales, wheezing or rhonchi  ABDOMEN: Soft, non-tender, non-distended EXTREMITIES:  No edema; No deformity   ASSESSMENT AND PLAN: .   Chronic HFrEF/NICM with an EF of 20 to 25% by echocardiogram and nonobstructive coronary artery disease over the left heart catheterization.  ICD inserted 03/2022.  5 pound weight drop since changing furosemide to torsemide.  Still complains of some abdominal bloating.  Often has dietary indiscretions this is not his heart change diet for an entire household.  He is continued on GDMT Jardiance 10 mg daily, Toprol-XL 37.5 mg a.m. and 50 mg in p.m., and Entresto 24/26 mg twice daily, and spironolactone 25 mg daily torsemide 20 mg daily. Encouraged to continue to monitor weight, limit sodium intake and fluid intake.  Nonobstructive coronary artery disease on heart catheterization  without angina.  He denies have any chest pain or anginal equivalents.  He is continued on aspirin 81 mg daily and rosuvastatin 20 mg daily.  Hypertension with blood pressure today 120/82.  He is continued on his current medication regimen without changes today his blood pressure is stable.  He is encouraged to continue to monitor his pressure 1 to 2 hours postmedication administration.  Hyperlipidemia with last LDL 61.  He is continued on rosuvastatin 20 mg daily.    Nonsustained ventricular tachycardia-previously.  He is continued on beta-blocker therapy with occasional PVCs on event monitor noted.  His last mode upload was May 2024.  He has been given the telephone number of device clinic to ensure his device at home is functioning appropriately.  ICD in situ with in person appointment in May for device check.  Encouraged to maintain all follow-up appointments with EP.  Obstructive sleep apnea on CPAP with continued compliance encouraged.  Overweight with a BMI 27.9.  He is requesting Ozempic or an additional medication to help with weight loss considering his longstanding history of HFrEF and nonobstructive coronary artery disease.  I have submitted prescriptions to the pharmacy of choice as weight loss would be beneficial.  Dispo: Patient to return to clinic to see MD/APP in 3 to 4 months or sooner if needed  Signed, Tniyah Nakagawa, NP

## 2023-03-07 ENCOUNTER — Ambulatory Visit: Payer: Managed Care, Other (non HMO) | Attending: Cardiology | Admitting: Cardiology

## 2023-03-07 ENCOUNTER — Encounter: Payer: Self-pay | Admitting: Cardiology

## 2023-03-07 ENCOUNTER — Encounter: Payer: Self-pay | Admitting: *Deleted

## 2023-03-07 VITALS — BP 120/82 | HR 100 | Ht 74.0 in | Wt 218.0 lb

## 2023-03-07 DIAGNOSIS — I5020 Unspecified systolic (congestive) heart failure: Secondary | ICD-10-CM | POA: Diagnosis not present

## 2023-03-07 DIAGNOSIS — I4729 Other ventricular tachycardia: Secondary | ICD-10-CM | POA: Diagnosis not present

## 2023-03-07 DIAGNOSIS — I428 Other cardiomyopathies: Secondary | ICD-10-CM

## 2023-03-07 DIAGNOSIS — I10 Essential (primary) hypertension: Secondary | ICD-10-CM

## 2023-03-07 DIAGNOSIS — Z95810 Presence of automatic (implantable) cardiac defibrillator: Secondary | ICD-10-CM

## 2023-03-07 DIAGNOSIS — E782 Mixed hyperlipidemia: Secondary | ICD-10-CM

## 2023-03-07 DIAGNOSIS — I2510 Atherosclerotic heart disease of native coronary artery without angina pectoris: Secondary | ICD-10-CM

## 2023-03-07 DIAGNOSIS — E663 Overweight: Secondary | ICD-10-CM

## 2023-03-07 DIAGNOSIS — G4733 Obstructive sleep apnea (adult) (pediatric): Secondary | ICD-10-CM

## 2023-03-07 MED ORDER — SEMAGLUTIDE-WEIGHT MANAGEMENT 0.25 MG/0.5ML ~~LOC~~ SOAJ
0.2500 mg | SUBCUTANEOUS | 0 refills | Status: AC
Start: 2023-03-07 — End: 2023-04-04

## 2023-03-07 MED ORDER — SEMAGLUTIDE-WEIGHT MANAGEMENT 0.5 MG/0.5ML ~~LOC~~ SOAJ: 0.5000 mg | SUBCUTANEOUS | 0 refills | Status: DC

## 2023-03-07 NOTE — Patient Instructions (Addendum)
Medication Instructions:  - Your physician has recommended you make the following change in your medication:   1) Start taking Wegovy  Month 1: 0.25 mg once a week.  Month 2: 0.5 mg once a week. (Call or send [place] a MyChart message when you are on week 2, so we can send your next dose in for you).  Month 3: 1 mg once a week.  Month 4: 1.7 mg once a week.  Month 5 and beyond: 2.4 mg once a week (maintenance dose)    *If you need a refill on your cardiac medications before your next appointment, please call your pharmacy*   Lab Work: - none ordered  If you have labs (blood work) drawn today and your tests are completely normal, you will receive your results only by: MyChart Message (if you have MyChart) OR A paper copy in the mail If you have any lab test that is abnormal or we need to change your treatment, we will call you to review the results.   Testing/Procedures: - none ordered   Follow-Up: At [facility], you and your health needs are our priority.  As part of our continuing mission to provide you with exceptional heart care, we have created designated Provider Care Teams.  These Care Teams include your primary Cardiologist (physician) and Advanced Practice Providers (APPs -  Physician Assistants and Nurse Practitioners) who all work together to provide you with the care you need, when you need it.  We recommend signing up for the patient portal called "MyChart".  Sign up information is provided on this After Visit Summary.  MyChart is used to connect with patients for Virtual Visits (Telemedicine).  Patients are able to view lab/test results, encounter notes, upcoming appointments, etc.  Non-urgent messages can be sent to your provider as well.   To learn more about what you can do with MyChart, go to [email or web address].    Your next appointment:   1) 3-4  months with Dr. Janalyn Harder, NP  2) May 2025 with Dr. Ernestina Patches, NP  Your  last 2 remote transmission have not come through. Please call the [facility] Team at [phone] to discuss.   Provider:   As above     Other Instructions  Semaglutide Injection (Weight Management) What is this medication? SEMAGLUTIDE (SEM a GLOO tide) promotes weight loss. It may also be used to maintain weight loss.  It works by decreasing appetite. It can be used to lower the risk of heart attack and stroke in people affected by excess weight. Changes to diet and exercise are often combined with this medication. This medicine may be used for other purposes; ask your health care provider or pharmacist if you have questions. COMMON BRAND NAME(S): [id number] What should I tell my care team before I take this medication? They need to know if you have any of these conditions: Diabetes Eye disease caused by diabetes Gallbladder disease Have or have had depression Have or have had pancreatitis Having surgery Kidney disease Personal or family history of MEN 2, a condition that causes endocrine gland tumors Personal or family history of thyroid cancer Stomach or intestine problems, such as problems digesting food Suicidal thoughts, plans, or attempt An unusual or allergic reaction to semaglutide, other medications, foods, dyes, or preservatives Pregnant or trying to get pregnant Breastfeeding How should I use this medication? This medication is injected under the skin. You will be taught how to prepare and give it. Take  it as directed on the prescription label. It is given once every week (every 7 days). Keep taking it unless your care team tells you to stop. It is important that you put your used needles and pens in a special sharps container. Do not put them in a trash can. If you do not have a sharps container, call your pharmacist or care team to get one. A special MedGuide will be given to you by the pharmacist with each prescription and refill. Be sure to read this  information carefully each time. This medication comes with INSTRUCTIONS FOR USE. Ask your pharmacist for directions on how to use this medication. Read the information carefully. Talk to your pharmacist or care team if you have questions. Talk to your care team about the use of this medication in children. While it may be prescribed for children as young as 12 years for selected conditions, precautions do apply. Overdosage: If you think you have taken too much of this medicine contact a poison control center or emergency room at once. NOTE: This medicine is only for you. Do not share this medicine with others. What if I miss a dose? If you miss a dose and the next scheduled dose is more than 2 days away, take the missed dose as soon as possible. If you miss a dose and the next scheduled dose is less than 2 days away, do not take the missed dose. Take the next dose at your regular time. Do not take double or extra doses. If you miss your dose for 2 weeks or more, take the next dose at your regular time or call your care team to talk about how to restart this medication. What may interact with this medication? Insulin and other medications for diabetes This list may not describe all possible interactions. Give your health care provider a list of all the medicines, herbs, non-prescription drugs, or dietary supplements you use. Also tell them if you smoke, drink alcohol, or use illegal drugs. Some items may interact with your medicine. What should I watch for while using this medication? Visit your care team for regular checks on your progress. Tell your care team if your condition does not start to get better or if it gets worse. Tell your care team if you are taking medication to treat diabetes, such as insulin or glipizide. This may increase your risk of low blood sugar. Know the symptoms of low blood sugar and how to treat it. Talk to your care team about your risk of cancer. You may be more at risk  for certain types of cancer if you take this medication. Talk to your care team right away if you have a lump or swelling in your neck, hoarseness that does not go away, trouble swallowing, shortness of breath, or trouble breathing. Make sure you stay hydrated while taking this medication. Drink water often. Eat fruits and veggies that have a high water content. Drink more water when it is hot or you are active. Talk to your care team right away if you have fever, infection, vomiting, diarrhea, or if you sweat a lot while taking this medication. The loss of too much body fluid may make it dangerous for you to take this medication. If you are going to need surgery or a procedure, tell your care team that you are taking this medication. What side effects may I notice from receiving this medication? Side effects that you should report to your care team as soon as  possible: Allergic reactions--skin rash, itching, hives, swelling of the face, lips, tongue, or throat Change in vision Dehydration--increased thirst, dry mouth, feeling faint or lightheaded, headache, dark yellow or brown urine Gallbladder problems--severe stomach pain, nausea, vomiting, fever Heart palpitations--rapid, pounding, or irregular heartbeat Kidney injury--decrease in the amount of urine, swelling of the ankles, hands, or feet Pancreatitis--severe stomach pain that spreads to your back or gets worse after eating or when touched, fever, nausea, vomiting Thoughts of suicide or self-harm, worsening mood, feelings of depression Thyroid cancer--new mass or lump in the neck, pain or trouble swallowing, trouble breathing, hoarseness Side effects that usually do not require medical attention (report these to your care team if they continue or are bothersome): Diarrhea Loss of appetite Nausea Upset stomach This list may not describe all possible side effects. Call your doctor for medical advice about side effects. You may report side  effects to FDA at 1-800-FDA-1088. Where should I keep my medication? Keep out of the reach of children and pets. Refrigeration (preferred): Store in the refrigerator. Do not freeze. Keep this medication in the original container until you are ready to take it. Get rid of any unused medication after the expiration date. Room temperature: If needed, prior to cap removal, the pen can be stored at room temperature for up to 28 days. Protect from light. If it is stored at room temperature, get rid of any unused medication after 28 days or after it expires, whichever is first. It is important to get rid of the medication as soon as you no longer need it or it is expired. You can do this in two ways: Take the medication to a medication take-back program. Check with your pharmacy or law enforcement to find a location. If you cannot return the medication, follow the directions in the MedGuide. NOTE: This sheet is a summary. It may not cover all possible information. If you have questions about this medicine, talk to your doctor, pharmacist, or health care provider.  2024 Elsevier/Gold Standard (2023-01-13 00:00:00)

## 2023-03-08 ENCOUNTER — Telehealth: Payer: Self-pay | Admitting: Pharmacy Technician

## 2023-03-08 ENCOUNTER — Other Ambulatory Visit (HOSPITAL_COMMUNITY): Payer: Self-pay

## 2023-03-08 NOTE — Telephone Encounter (Signed)
Pharmacy Patient Advocate Encounter   Received notification from Fax that prior authorization for [facility] 0.25MG /0.5ML auto-injectors is required/requested.   Insurance verification completed.   The patient is insured through [facility] .   Per test claim: PA required; PA submitted to above mentioned insurance via CoverMyMeds Key/confirmation #/[id number] Status is pending

## 2023-03-09 NOTE — Telephone Encounter (Signed)
Pharmacy Patient Advocate Encounter  Received notification from [facility] that Prior Authorization for Wegovy 0.25MG /0.5ML auto-injectors  has been APPROVED from 03/08/23 to 03/07/24. Spoke to pharmacy to process.Copay is $24.99.    PA #/Case ID/Reference #: [id number]

## 2023-03-28 ENCOUNTER — Ambulatory Visit (INDEPENDENT_AMBULATORY_CARE_PROVIDER_SITE_OTHER): Payer: Managed Care, Other (non HMO) | Admitting: Nurse Practitioner

## 2023-03-28 VITALS — BP 124/80 | HR 118 | Temp 98.0°F | Resp 18 | Ht 74.0 in | Wt 209.4 lb

## 2023-03-28 DIAGNOSIS — Z0001 Encounter for general adult medical examination with abnormal findings: Secondary | ICD-10-CM | POA: Diagnosis not present

## 2023-03-28 DIAGNOSIS — E785 Hyperlipidemia, unspecified: Secondary | ICD-10-CM | POA: Diagnosis not present

## 2023-03-28 DIAGNOSIS — R7303 Prediabetes: Secondary | ICD-10-CM | POA: Diagnosis not present

## 2023-03-28 DIAGNOSIS — Z0000 Encounter for general adult medical examination without abnormal findings: Secondary | ICD-10-CM

## 2023-03-28 DIAGNOSIS — Z130 Encounter for screening for diseases of the blood and blood-forming organs and certain disorders involving the immune mechanism: Secondary | ICD-10-CM

## 2023-03-28 DIAGNOSIS — Z122 Encounter for screening for malignant neoplasm of respiratory organs: Secondary | ICD-10-CM

## 2023-03-28 DIAGNOSIS — Z125 Encounter for screening for malignant neoplasm of prostate: Secondary | ICD-10-CM

## 2023-03-28 NOTE — Addendum Note (Signed)
Addended by: Davene Costain on: 03/28/2023 09:21 AM   Modules accepted: Orders

## 2023-03-28 NOTE — Progress Notes (Signed)
Name: Willie Hess   MRN: [id number]    DOB: January 12, 1966   Date:03/28/2023       Progress Note  Subjective  Chief Complaint  Chief Complaint  Patient presents with   Annual Exam    HPI  Patient presents for annual CPE.   IPSS Questionnaire (AUA-7): Over the past month.   1)  How often have you had a sensation of not emptying your bladder completely after you finish urinating?  0 - Not at all  2)  How often have you had to urinate again less than two hours after you finished urinating? 0 - Not at all  3)  How often have you found you stopped and started again several times when you urinated?  0 - Not at all  4) How difficult have you found it to postpone urination?  0 - Not at all  5) How often have you had a weak urinary stream?  0 - Not at all  6) How often have you had to push or strain to begin urination?  0 - Not at all  7) How many times did you most typically get up to urinate from the time you went to bed until the time you got up in the morning?  2 - 2 times  Total score:  0-7 mildly symptomatic   8-19 moderately symptomatic   20-35 severely symptomatic     Diet: tries to eat well balanced Exercise: drums every weekend Sleep: 6 hours Last dental exam:goes every 6 months Last eye exam: a few years ago Health Maintenance  Topic Date Due   Pneumococcal Vaccination (1 of 2 - PCV) Never done   Screening for Lung Cancer  04/18/2023   COVID-19 Vaccine (1) 04/13/2023*   Flu Shot  05/15/2023*   Zoster (Shingles) Vaccine (1 of 2) 05/28/2023*   Colon Cancer Screening  04/21/2026   DTaP/Tdap/Td vaccine (2 - Td or Tdap) 10/12/2026   Hepatitis C Screening  Completed   HIV Screening  Completed   HPV Vaccine  Aged Out  *Topic was postponed. The date shown is not the original due date.    Depression: phq 9 is negative    02/27/2023    1:35 PM 04/27/2022    2:25 PM 03/25/2022    8:33 AM 11/24/2021    7:58 AM 11/22/2021    9:43 AM  Depression screen PHQ 2/9  Decreased Interest  0 0 0 0 0  Down, Depressed, Hopeless 0 0 0 0 0  PHQ - 2 Score 0 0 0 0 0  Altered sleeping 0 0     Tired, decreased energy 0 0     Change in appetite 0 0     Feeling bad or failure about yourself  0 0     Trouble concentrating 0 0     Moving slowly or fidgety/restless 0 0     Suicidal thoughts 0 0     PHQ-9 Score 0 0     Difficult doing work/chores Not difficult at all Not difficult at all       Hypertension:  BP Readings from Last 3 Encounters:  03/28/23 124/80  03/07/23 120/82  02/27/23 120/68    Obesity: Wt Readings from Last 3 Encounters:  03/28/23 209 lb 6.4 oz (95 kg)  03/07/23 218 lb (98.9 kg)  02/27/23 223 lb 8 oz (101.4 kg)   BMI Readings from Last 3 Encounters:  03/28/23 26.89 kg/m  03/07/23 27.99 kg/m  02/27/23 28.70 kg/m  Lipids:  Lab Results  Component Value Date   CHOL 287 (H) 03/25/2022   CHOL 225 (H) 03/19/2021   CHOL 248 (H) 04/09/2020   Lab Results  Component Value Date   HDL 74 03/25/2022   HDL 61 03/19/2021   HDL 58 04/09/2020   Lab Results  Component Value Date   LDLCALC 167 (H) 03/25/2022   LDLCALC 136 (H) 03/19/2021   LDLCALC 153 (H) 04/09/2020   Lab Results  Component Value Date   TRIG 252 (H) 03/25/2022   TRIG 160 (H) 03/19/2021   TRIG 207 (H) 04/09/2020   Lab Results  Component Value Date   CHOLHDL 3.9 03/25/2022   CHOLHDL 3.7 03/19/2021   CHOLHDL 4.3 04/09/2020   No results found for: "LDLDIRECT" Glucose:  Glucose  Date Value Ref Range Status  01/10/2023 105 (H) 70 - 99 mg/dL Final  16/11/9602 93 70 - 99 mg/dL Final  54/10/8117 147 (H) 70 - 99 mg/dL Final   Glucose, Bld  Date Value Ref Range Status  04/04/2022 104 (H) 70 - 99 mg/dL Final    Comment:    Glucose reference range applies only to samples taken after fasting for at least 8 hours.  11/24/2021 142 (H) 70 - 99 mg/dL Final    Comment:    Glucose reference range applies only to samples taken after fasting for at least 8 hours.  09/16/2014 132 (H) 65  - 99 mg/dL Final    Flowsheet Row Office Visit from 03/28/2023 in [facility]  AUDIT-C Score 1         Married STD testing and prevention (HIV/chl/gon/syphilis): completed Hep C: completed  Skin cancer: Discussed monitoring for atypical lesions Colorectal cancer: 04/20/2021 Prostate cancer: due No results found for: "PSA"   Lung cancer:   Low Dose CT Chest recommended if Age 58-80 years, 30 pack-year currently smoking OR have quit w/in 15years. Patient does qualify.   AAA:  The USPSTF recommends one-time screening with ultrasonography in men ages 6 to 31 years who have ever smoked ECG:  01/10/2023  Vaccines:  HPV: up to at age 58 , ask insurance if age between 58-45  Shingrix: 10-58 yo and ask insurance if covered when patient above 58 yo Pneumonia:  educated and discussed with patient. Flu:  educated and discussed with patient.  Advanced Care Planning: A voluntary discussion about advance care planning including the explanation and discussion of advance directives.  Discussed health care proxy and Living will, and the patient was able to identify a health care proxy as wife.  Patient does not have a living will at present time. If patient does have living will, I have requested they bring this to the clinic to be scanned in to their chart.  Patient Active Problem List   Diagnosis Date Noted   Nonischemic cardiomyopathy (HCC) 04/06/2022   Nonsustained ventricular tachycardia (HCC) 04/06/2022   OSA (obstructive sleep apnea) 06/15/2021   HFrEF (heart failure with reduced ejection fraction) (HCC) 05/25/2021   Excessive daytime sleepiness 04/29/2021   History of colonic polyps    Polyp of transverse colon    Hyperlipidemia 04/28/2020   Prediabetes 09/25/2017   Hx of smoking 07/27/2017   Benign neoplasm of cecum    Benign neoplasm of ascending colon    Benign neoplasm of transverse colon    Benign neoplasm of sigmoid colon    GERD without  esophagitis 12/11/2014    Past Surgical History:  Procedure Laterality Date   COLONOSCOPY WITH  PROPOFOL N/A 11/12/2015   Procedure: COLONOSCOPY WITH PROPOFOL;  Surgeon: Midge Minium, MD;  Location: [facility];  Service: Endoscopy;  Laterality: N/A;   COLONOSCOPY WITH PROPOFOL N/A 04/20/2021   Procedure: COLONOSCOPY WITH PROPOFOL;  Surgeon: Midge Minium, MD;  Location: [facility] ENDOSCOPY;  Service: Endoscopy;  Laterality: N/A;   ESOPHAGOGASTRODUODENOSCOPY     HERNIA REPAIR     ICD IMPLANT N/A 04/06/2022   Procedure: ICD IMPLANT;  Surgeon: Lanier Prude, MD;  Location: [facility] INVASIVE CV LAB;  Service: Cardiovascular;  Laterality: N/A;   INSERTION OF MESH Left 08/24/2017   Procedure: INSERTION OF MESH;  Surgeon: Leafy Ro, MD;  Location: [facility] ORS;  Service: General;  Laterality: Left;   POLYPECTOMY  11/12/2015   Procedure: POLYPECTOMY;  Surgeon: Midge Minium, MD;  Location: [facility];  Service: Endoscopy;;   RIGHT/LEFT HEART CATH AND CORONARY ANGIOGRAPHY  05/25/2021   Procedure: RIGHT/LEFT HEART CATH AND CORONARY ANGIOGRAPHY;  Surgeon: Yvonne Kendall, MD;  Location: [facility] INVASIVE CV LAB;  Service: Cardiovascular;;   ROBOT ASSISTED INGUINAL HERNIA REPAIR Left 08/24/2017   Procedure: ROBOT ASSISTED INGUINAL HERNIA REPAIR;  Surgeon: Leafy Ro, MD;  Location: [facility] ORS;  Service: General;  Laterality: Left;   URETEROSCOPY WITH HOLMIUM LASER LITHOTRIPSY Left 09/23/2014   Procedure: URETEROSCOPY, CYSTOSCOPY WITH STENT PLACEMENT, HOLMIUM STAND BY ;  Surgeon: Orson Ape, MD;  Location: [facility] ORS;  Service: Urology;  Laterality: Left;   VASECTOMY     WISDOM TOOTH EXTRACTION      Family History  Problem Relation Age of Onset   Thyroid disease Mother    Heart disease Father    Heart disease Sister    Other Brother        Lyme Disease   Prostate cancer Brother    Heart disease Paternal Grandmother    Emphysema Paternal Grandfather     Social History    Socioeconomic History   Marital status: Married    Spouse name: Lauren   Number of children: 2   Years of education: Not on file   Highest education level: Associate degree: occupational, Scientist, product/process development, or vocational program  Occupational History   Not on file  Tobacco Use   Smoking status: Former    Current packs/day: 0.00    Average packs/day: 1 pack/day for 36.0 years (36.0 ttl pk-yrs)    Types: Cigarettes    Start date: 61    Quit date: 2020    Years since quitting: 5.1   Smokeless tobacco: Never  Vaping Use   Vaping status: Never Used  Substance and Sexual Activity   Alcohol use: No   Drug use: No   Sexual activity: Yes    Partners: Female    Birth control/protection: Surgical  Other Topics Concern   Not on file  Social History Narrative   Works at [employer]   Social Drivers of [employer] Strain: Medium Risk (03/28/2023)   Overall Financial Resource Strain (CARDIA)    Difficulty of Paying Living Expenses: Somewhat hard  Food Insecurity: Food Insecurity Present (03/28/2023)   Hunger Vital Sign    Worried About Running Out of Food in the Last Year: Sometimes true    Ran Out of Food in the Last Year: Never true  Transportation Needs: No Transportation Needs (03/28/2023)   PRAPARE - Administrator, Civil Service (Medical): No    Lack of Transportation (Non-Medical): No  Physical Activity: Insufficiently Active (03/28/2023)   Exercise Vital Sign  Days of Exercise per Week: 2 days    Minutes of Exercise per Session: 60 min  Stress: Stress Concern Present (03/28/2023)   [employer] of Occupational Health - Occupational Stress Questionnaire    Feeling of Stress : To some extent  Social Connections: Moderately Integrated (03/28/2023)   Social Connection and Isolation Panel [NHANES]    Frequency of Communication with Friends and Family: More than three times a week    Frequency of Social Gatherings with Friends and Family: Once a week     Attends Religious Services: More than 4 times per year    Active Member of [employer] or Organizations: No    Attends Banker Meetings: Not on file    Marital Status: Married  Catering manager Violence: Not At Risk (03/25/2022)   Humiliation, Afraid, Rape, and Kick questionnaire    Fear of Current or Ex-Partner: No    Emotionally Abused: No    Physically Abused: No    Sexually Abused: No     Current Outpatient Medications:    empagliflozin (JARDIANCE) 10 MG TABS tablet, TAKE 1 TABLET BY MOUTH DAILY  BEFORE BREAKFAST, Disp: 90 tablet, Rfl: 2   levocetirizine (XYZAL) 5 MG tablet, TAKE 1 TABLET(5 MG) BY MOUTH EVERY EVENING, Disp: 90 tablet, Rfl: 1   metoprolol succinate (TOPROL-XL) 25 MG 24 hr tablet, Take 1.5 tablets (37.5 mg total) by mouth in the morning AND 2 tablets (50 mg total) every evening., Disp: 315 tablet, Rfl: 0   Omeprazole-Sodium Bicarbonate (ZEGERID) 20-1100 MG CAPS capsule, Take 2 capsules by mouth daily. , Disp: , Rfl:    potassium chloride (KLOR-CON) 10 MEQ tablet, Take 1 tablet (10 mEq total) by mouth daily., Disp: 90 tablet, Rfl: 3   rosuvastatin (CRESTOR) 20 MG tablet, TAKE 1 TABLET(20 MG) BY MOUTH DAILY, Disp: 90 tablet, Rfl: 0   sacubitril-valsartan (ENTRESTO) 24-26 MG, TAKE 1 TABLET BY MOUTH TWICE  DAILY, Disp: 180 tablet, Rfl: 2   Semaglutide-Weight Management 0.25 MG/0.5ML SOAJ, Inject 0.25 mg into the skin once a week for 28 days., Disp: 2 mL, Rfl: 0   [START ON 04/05/2023] Semaglutide-Weight Management 0.5 MG/0.5ML SOAJ, Inject 0.5 mg into the skin once a week for 28 days., Disp: 2 mL, Rfl: 0   spironolactone (ALDACTONE) 25 MG tablet, Take 1 tablet (25 mg total) by mouth daily., Disp: 90 tablet, Rfl: 0   torsemide (DEMADEX) 20 MG tablet, Take 1 tablet (20 mg total) by mouth 2 (two) times daily., Disp: 180 tablet, Rfl: 3  Allergies  Allergen Reactions   Bee Venom Swelling     ROS  Constitutional: Negative for fever or weight change.  Respiratory:  Negative for cough and shortness of breath.   Cardiovascular: Negative for chest pain or palpitations.  Gastrointestinal: Negative for abdominal pain, no bowel changes.  Musculoskeletal: Negative for gait problem or joint swelling.  Skin: Negative for rash.  Neurological: Negative for dizziness or headache.  No other specific complaints in a complete review of systems (except as listed in HPI above).    Objective  Vitals:   03/28/23 0852  BP: 124/80  Pulse: (!) 118  Resp: 18  Temp: 98 F (36.7 C)  SpO2: 97%  Weight: 209 lb 6.4 oz (95 kg)  Height: 6\' 2"  (1.88 m)    Body mass index is 26.89 kg/m.  Physical Exam Vitals reviewed.  Constitutional:      Appearance: Normal appearance.  HENT:     Head: Normocephalic.     Right  Ear: Tympanic membrane normal.     Left Ear: Tympanic membrane normal.     Nose: Nose normal.  Eyes:     Extraocular Movements: Extraocular movements intact.     Conjunctiva/sclera: Conjunctivae normal.     Pupils: Pupils are equal, round, and reactive to light.  Neck:     Thyroid: No thyroid mass, thyromegaly or thyroid tenderness.  Cardiovascular:     Rate and Rhythm: Normal rate and regular rhythm.     Pulses: Normal pulses.     Heart sounds: Normal heart sounds.  Pulmonary:     Effort: Pulmonary effort is normal.     Breath sounds: Normal breath sounds.  Abdominal:     General: Bowel sounds are normal.     Palpations: Abdomen is soft.  Musculoskeletal:        General: Normal range of motion.     Cervical back: Normal range of motion and neck supple.     Right lower leg: No edema.     Left lower leg: No edema.  Skin:    General: Skin is warm and dry.     Capillary Refill: Capillary refill takes less than 2 seconds.  Neurological:     General: No focal deficit present.     Mental Status: He is alert and oriented to person, place, and time. Mental status is at baseline.  Psychiatric:        Mood and Affect: Mood normal.        Behavior:  Behavior normal.        Thought Content: Thought content normal.        Judgment: Judgment normal.      Recent Results (from the past 2160 hours)  Basic Metabolic Panel (BMET)     Status: Abnormal   Collection Time: 01/10/23 10:36 AM  Result Value Ref Range   Glucose 105 (H) 70 - 99 mg/dL   BUN 14 6 - 24 mg/dL   Creatinine, Ser 1.61 0.76 - 1.27 mg/dL   eGFR 66 >09 UE/AVW/0.98   BUN/Creatinine Ratio 11 9 - 20   Sodium 142 134 - 144 mmol/L   Potassium 4.5 3.5 - 5.2 mmol/L   Chloride 99 96 - 106 mmol/L   CO2 24 20 - 29 mmol/L   Calcium 9.7 8.7 - 10.2 mg/dL     Fall Risk:    03/04/1476    8:53 AM 04/27/2022    2:25 PM 03/25/2022    8:32 AM 11/24/2021    7:58 AM 11/22/2021    9:43 AM  Fall Risk   Falls in the past year? 0 0 0 0 0  Number falls in past yr: 0 0 0 0 0  Injury with Fall? 0 0 0 0 0  Risk for fall due to :  No Fall Risks     Follow up  Falls prevention discussed;Education provided;Falls evaluation completed  Falls evaluation completed Falls evaluation completed      Functional Status Survey: Is the patient deaf or have difficulty hearing?: No Does the patient have difficulty seeing, even when wearing glasses/contacts?: No Does the patient have difficulty concentrating, remembering, or making decisions?: No Does the patient have difficulty walking or climbing stairs?: No Does the patient have difficulty dressing or bathing?: No Does the patient have difficulty doing errands alone such as visiting a doctor's office or shopping?: No    Assessment & Plan  1. Annual physical exam (Primary)  - Ambulatory Referral Lung Cancer Screening [facility] - CBC  with Differential/Platelet - COMPLETE METABOLIC PANEL WITH GFR - Lipid panel - Hemoglobin A1c - PSA  2. Screening for prostate cancer  - PSA  3. Screening for lung cancer  - Ambulatory Referral Lung Cancer Screening [facility]  4. Hyperlipidemia, unspecified hyperlipidemia type  -  COMPLETE METABOLIC PANEL WITH GFR - Lipid panel  5. Prediabetes  - COMPLETE METABOLIC PANEL WITH GFR - Hemoglobin A1c  6. Screening for deficiency anemia  - CBC with Differential/Platelet    -Prostate cancer screening and PSA options (with potential risks and benefits of testing vs not testing) were discussed along with recent recs/guidelines. -USPSTF grade A and B recommendations reviewed with patient; age-appropriate recommendations, preventive care, screening tests, etc discussed and encouraged; healthy living encouraged; see AVS for patient education given to patient -Discussed importance of 150 minutes of physical activity weekly, eat two servings of fish weekly, eat one serving of tree nuts ( cashews, pistachios, pecans, almonds.Marland Kitchen) every other day, eat 6 servings of fruit/vegetables daily and drink plenty of water and avoid sweet beverages.  -Reviewed Health Maintenance: yes

## 2023-03-29 ENCOUNTER — Encounter: Payer: Self-pay | Admitting: Nurse Practitioner

## 2023-03-29 LAB — COMPREHENSIVE METABOLIC PANEL
ALT: 49 [IU]/L — ABNORMAL HIGH (ref 0–44)
AST: 47 [IU]/L — ABNORMAL HIGH (ref 0–40)
Albumin: 4.3 g/dL (ref 3.8–4.9)
Alkaline Phosphatase: 81 [IU]/L (ref 44–121)
BUN/Creatinine Ratio: 11 (ref 9–20)
BUN: 13 mg/dL (ref 6–24)
Bilirubin Total: 0.5 mg/dL (ref 0.0–1.2)
CO2: 29 mmol/L (ref 20–29)
Calcium: 9.5 mg/dL (ref 8.7–10.2)
Chloride: 93 mmol/L — ABNORMAL LOW (ref 96–106)
Creatinine, Ser: 1.17 mg/dL (ref 0.76–1.27)
Globulin, Total: 3 g/dL (ref 1.5–4.5)
Glucose: 101 mg/dL — ABNORMAL HIGH (ref 70–99)
Potassium: 3.4 mmol/L — ABNORMAL LOW (ref 3.5–5.2)
Sodium: 138 mmol/L (ref 134–144)
Total Protein: 7.3 g/dL (ref 6.0–8.5)
eGFR: 73 mL/min/{1.73_m2} (ref >59)

## 2023-03-29 LAB — CBC WITH DIFFERENTIAL/PLATELET
Basophils Absolute: 0.1 10*3/uL (ref 0.0–0.2)
Basos: 1 %
EOS (ABSOLUTE): 0.1 10*3/uL (ref 0.0–0.4)
Eos: 1 %
Hematocrit: 48.8 % (ref 37.5–51.0)
Hemoglobin: 16.7 g/dL (ref 13.0–17.7)
Immature Grans (Abs): 0.1 10*3/uL (ref 0.0–0.1)
Immature Granulocytes: 1 %
Lymphocytes Absolute: 1.5 10*3/uL (ref 0.7–3.1)
Lymphs: 19 %
MCH: 33.2 pg — ABNORMAL HIGH (ref 26.6–33.0)
MCHC: 34.2 g/dL (ref 31.5–35.7)
MCV: 97 fL (ref 79–97)
Monocytes Absolute: 0.6 10*3/uL (ref 0.1–0.9)
Monocytes: 8 %
Neutrophils Absolute: 5.5 10*3/uL (ref 1.4–7.0)
Neutrophils: 70 %
Platelets: 316 10*3/uL (ref 150–450)
RBC: 5.03 x10E6/uL (ref 4.14–5.80)
RDW: 12.8 % (ref 11.6–15.4)
WBC: 7.9 10*3/uL (ref 3.4–10.8)

## 2023-03-29 LAB — LIPID PANEL
Chol/HDL Ratio: 5.2 {ratio} — ABNORMAL HIGH (ref 0.0–5.0)
Cholesterol, Total: 269 mg/dL — ABNORMAL HIGH (ref 100–199)
HDL: 52 mg/dL (ref >39)
LDL Chol Calc (NIH): 127 mg/dL — ABNORMAL HIGH (ref 0–99)
Triglycerides: 503 mg/dL — ABNORMAL HIGH (ref 0–149)
VLDL Cholesterol Cal: 90 mg/dL — ABNORMAL HIGH (ref 5–40)

## 2023-03-29 LAB — HEMOGLOBIN A1C
Est. average glucose Bld gHb Est-mCnc: 114 mg/dL
Hgb A1c MFr Bld: 5.6 % (ref 4.8–5.6)

## 2023-03-29 LAB — PSA: Prostate Specific Ag, Serum: 0.5 ng/mL (ref 0.0–4.0)

## 2023-04-11 ENCOUNTER — Ambulatory Visit (INDEPENDENT_AMBULATORY_CARE_PROVIDER_SITE_OTHER): Payer: Managed Care, Other (non HMO)

## 2023-04-11 DIAGNOSIS — I428 Other cardiomyopathies: Secondary | ICD-10-CM

## 2023-04-12 ENCOUNTER — Other Ambulatory Visit: Payer: Self-pay | Admitting: Acute Care

## 2023-04-12 DIAGNOSIS — Z87891 Personal history of nicotine dependence: Secondary | ICD-10-CM

## 2023-04-12 LAB — CUP PACEART REMOTE DEVICE CHECK
Battery Remaining Longevity: 168 mo
Battery Remaining Percentage: 100 %
Brady Statistic RV Percent Paced: 0 %
Date Time Interrogation Session: 20250225070200
HighPow Impedance: 87 Ohm
Implantable Lead Connection Status: 753985
Implantable Lead Implant Date: 20240221
Implantable Lead Location: 753860
Implantable Lead Model: 673
Implantable Lead Serial Number: 216708
Implantable Pulse Generator Implant Date: 20240221
Lead Channel Impedance Value: 827 Ohm
Lead Channel Pacing Threshold Amplitude: 0.7 V
Lead Channel Pacing Threshold Pulse Width: 0.4 ms
Lead Channel Setting Pacing Amplitude: 2 V
Lead Channel Setting Pacing Pulse Width: 0.4 ms
Lead Channel Setting Sensing Sensitivity: 0.6 mV
Pulse Gen Serial Number: 321783
Zone Setting Status: 755011

## 2023-04-13 ENCOUNTER — Other Ambulatory Visit (HOSPITAL_COMMUNITY): Payer: Self-pay

## 2023-04-13 ENCOUNTER — Telehealth: Payer: Self-pay | Admitting: Pharmacy Technician

## 2023-04-13 ENCOUNTER — Encounter: Payer: Self-pay | Admitting: Pharmacist

## 2023-04-13 MED ORDER — WEGOVY 1 MG/0.5ML ~~LOC~~ SOAJ: 1.0000 mg | SUBCUTANEOUS | 0 refills | Status: DC

## 2023-04-13 NOTE — Telephone Encounter (Signed)
 Pharmacy Patient Advocate Encounter  Received notification from [facility] that Prior Authorization for wegovy has beenAPPROVED from 03/08/23 to 03/07/24. However, this can not be filled until the patient enrolls in Weight Watchers thru [facility] for Obesity drug coverage- [email or web address]. once he enrolls, his pharmacy should be able to fill it.   PA #/Case ID/Reference #: [id number]

## 2023-04-13 NOTE — Telephone Encounter (Signed)
 Pharmacy Patient Advocate Encounter   Received notification from Fax that prior authorization for [facility] 1MG  is required/requested.   Insurance verification completed.   The patient is insured through [facility] .   Per test claim: PA required; PA submitted to above mentioned insurance via CoverMyMeds Key/confirmation #/EOC [id number] Status is pending

## 2023-04-27 ENCOUNTER — Ambulatory Visit
Admission: RE | Admit: 2023-04-27 | Discharge: 2023-04-27 | Disposition: A | Discharge status: Home / Self Care | Payer: Managed Care, Other (non HMO) | Source: Ambulatory Visit | Attending: Acute Care | Admitting: Acute Care

## 2023-04-27 ENCOUNTER — Encounter: Payer: Self-pay | Admitting: Cardiology

## 2023-04-27 DIAGNOSIS — Z87891 Personal history of nicotine dependence: Secondary | ICD-10-CM

## 2023-04-28 ENCOUNTER — Telehealth: Payer: Self-pay | Admitting: Cardiovascular Disease

## 2023-04-28 MED ORDER — METOPROLOL SUCCINATE ER 25 MG PO TB24: ORAL_TABLET | ORAL | 3 refills | Status: DC

## 2023-04-28 MED ORDER — SPIRONOLACTONE 25 MG PO TABS: 25.0000 mg | ORAL_TABLET | Freq: Every day | ORAL | 3 refills | Status: AC

## 2023-04-28 NOTE — Telephone Encounter (Signed)
*  STAT* If patient is at the pharmacy, call can be transferred to refill team.   1. Which medications need to be refilled? (please list name of each medication and dose if known) metoprolol succinate (TOPROL-XL) 25 MG 24 hr tablet   spironolactone (ALDACTONE) 25 MG tablet  2. Which pharmacy/location (including street and city if local pharmacy) is medication to be sent to? [id number] [place], [place] - [address] AT [facility] OF [address] [facility] [phone]   3. Do they need a 30 day or 90 day supply? 90

## 2023-04-28 NOTE — Telephone Encounter (Signed)
 Pt's medication was sent to pt's pharmacy as requested. Confirmation received.

## 2023-05-22 NOTE — Progress Notes (Signed)
 Remote ICD transmission.

## 2023-05-22 NOTE — Addendum Note (Signed)
 Addended by: Elease Etienne A on: 05/22/2023 09:15 AM   Modules accepted: Orders

## 2023-05-23 ENCOUNTER — Other Ambulatory Visit: Payer: Self-pay

## 2023-05-23 DIAGNOSIS — Z87891 Personal history of nicotine dependence: Secondary | ICD-10-CM

## 2023-05-23 DIAGNOSIS — Z122 Encounter for screening for malignant neoplasm of respiratory organs: Secondary | ICD-10-CM

## 2023-05-31 ENCOUNTER — Telehealth: Payer: Self-pay

## 2023-05-31 DIAGNOSIS — I472 Ventricular tachycardia, unspecified: Secondary | ICD-10-CM

## 2023-05-31 NOTE — Telephone Encounter (Signed)
 Notified patient.  Lab orders put in - he will go tomorrow to draw station at the cardiology office in [place].  Also knows to increase his metoprolol to 50mg  bid.   Will keep appointment in May for now.

## 2023-05-31 NOTE — Telephone Encounter (Addendum)
 Notified patient.  He remembers having a brief episode of dizziness that morning but no other sx's. No changes to health or medications. No missed doses.  Currently on Metoprolol succinate 37.5 mg q am/ 50mg  qpm.   Overall doing well, no complaints.  Has appointment with Suzann Riddle on 07/07/23.  Gave [place] DMV driving restrictions for post VT w/therapy. Patient verbalizes understanding. ER precautions given if becomes symptomatic and/or receives a shock.   Forwarding to Dr. Marven Slimmer for review. ? Labs and/or need to move appt up with APP/provider?

## 2023-05-31 NOTE — Telephone Encounter (Addendum)
 Alert remote transmission:   Antitachycardia pacing (ATP) therapy delivered to convert arrhythmia. Event occurred 4/14 @ 08:46, duration 19sec, EGM c/w sustained VT, HR 198-212,  pace terminated with 1 burst of ATP  Overall note elevated heart rates with daily mean 113bpm.   LM on wife's number as could not leave message on husband's line.  Need to assess sx's, give driving restrictions and ensure taking meds.

## 2023-06-01 ENCOUNTER — Encounter: Payer: Self-pay | Admitting: *Deleted

## 2023-06-02 ENCOUNTER — Other Ambulatory Visit: Payer: Self-pay

## 2023-06-02 ENCOUNTER — Telehealth: Payer: Self-pay

## 2023-06-02 DIAGNOSIS — Z79899 Other long term (current) drug therapy: Secondary | ICD-10-CM

## 2023-06-02 LAB — BASIC METABOLIC PANEL WITH GFR
BUN/Creatinine Ratio: 10 (ref 9–20)
BUN: 11 mg/dL (ref 6–24)
CO2: 26 mmol/L (ref 20–29)
Calcium: 8.8 mg/dL (ref 8.7–10.2)
Chloride: 97 mmol/L (ref 96–106)
Creatinine, Ser: 1.07 mg/dL (ref 0.76–1.27)
Glucose: 96 mg/dL (ref 70–99)
Potassium: 3.7 mmol/L (ref 3.5–5.2)
Sodium: 140 mmol/L (ref 134–144)
eGFR: 81 mL/min/{1.73_m2} (ref >59)

## 2023-06-02 LAB — MAGNESIUM: Magnesium: 1.4 mg/dL — ABNORMAL LOW (ref 1.6–2.3)

## 2023-06-02 MED ORDER — MAG-OXIDE 200 MG PO TABS: 400.0000 mg | ORAL_TABLET | Freq: Two times a day (BID) | ORAL | 3 refills | Status: DC

## 2023-06-02 NOTE — Telephone Encounter (Signed)
 Notified pt that it is ok to continue the 20 meq of potassium daily. Pt verbalized understanding. All questions if any were answered.

## 2023-06-02 NOTE — Telephone Encounter (Signed)
-----   Message from Ozell Barter Tillery sent at 06/02/2023  8:30 AM EDT ----- Electrolytes were very low.   Needs to start Mag Ox 400 mg BID (New med)  He should Take 40 meq of potassium x 1 and then increase his daily dose to 20 meq daily.   Would recheck BMET and Mg early/middle of next week.

## 2023-06-02 NOTE — Telephone Encounter (Signed)
 Spoke with pt regarding his lab results. Mag Ox 400 mg BID ordered. Pt aware. Pt aware to take 40 meq of potassium once and then increase his daily dose to 20 meq daily. Pt stated he has already been taking 20 meq daily. Will notify ordering provider. BMET and Mag ordered and released. Pt plans to go to [facility] on 4/22. Pt verbalized understanding. All questions if any were answered.

## 2023-06-06 ENCOUNTER — Encounter: Payer: Self-pay | Admitting: *Deleted

## 2023-06-06 ENCOUNTER — Ambulatory Visit: Payer: Managed Care, Other (non HMO) | Attending: Cardiology | Admitting: Cardiology

## 2023-06-06 ENCOUNTER — Encounter: Payer: Self-pay | Admitting: Cardiology

## 2023-06-06 VITALS — BP 118/84 | HR 103 | Ht 73.5 in | Wt 212.8 lb

## 2023-06-06 DIAGNOSIS — G4733 Obstructive sleep apnea (adult) (pediatric): Secondary | ICD-10-CM

## 2023-06-06 DIAGNOSIS — I428 Other cardiomyopathies: Secondary | ICD-10-CM

## 2023-06-06 DIAGNOSIS — I2510 Atherosclerotic heart disease of native coronary artery without angina pectoris: Secondary | ICD-10-CM

## 2023-06-06 DIAGNOSIS — I5020 Unspecified systolic (congestive) heart failure: Secondary | ICD-10-CM | POA: Diagnosis not present

## 2023-06-06 DIAGNOSIS — I472 Ventricular tachycardia, unspecified: Secondary | ICD-10-CM | POA: Diagnosis not present

## 2023-06-06 DIAGNOSIS — I4729 Other ventricular tachycardia: Secondary | ICD-10-CM

## 2023-06-06 DIAGNOSIS — Z79899 Other long term (current) drug therapy: Secondary | ICD-10-CM

## 2023-06-06 DIAGNOSIS — E782 Mixed hyperlipidemia: Secondary | ICD-10-CM

## 2023-06-06 MED ORDER — METOPROLOL SUCCINATE ER 50 MG PO TB24: 50.0000 mg | ORAL_TABLET | Freq: Two times a day (BID) | ORAL | 3 refills | Status: AC

## 2023-06-06 NOTE — Progress Notes (Signed)
 Cardiology Office Note:  .   Date:  06/06/2023  ID:  Willie Hess, DOB [birth year], MRN [id number] PCP: Quinton Buckler, FNP  [facility] Providers Cardiologist:  Belva Boyden, MD Electrophysiologist:  Boyce Byes, MD    History of Present Illness: .   Willie Hess is a 58 y.o. male with a past medical history of nonobstructive coronary artery disease, ischemic cardiomyopathy with chronic HFrEF (EF 20 to 25% 04/2021), hyperlipidemia, prediabetes, GERD, severe OSA, nonsustained ventricular tachycardia with ICD implantation (04/06/2022), prior tobacco use, (quit 2021), who is here today for follow-up of his nonobstructive coronary artery disease and chronic HFrEF.   Initially evaluated in March 2023 for abnormal EKG with LVH and a strong family history of CAD and heart failure.  He had complaints of fatigue and associated dyspnea on exertion with angina.  Echocardiogram revealed severely reduced LV function with an EF of 20 to 25% and global hypokinesis.  Underwent diagnostic catheterization which showed mild, nonobstructive LAD disease, otherwise normal coronary arteries with elevated filling pressure with an LVEDP of 40, wedge of 35, normal cardiac output and index.  He was treated with GDMT.  Cardiac MRI in December 2023 showed an EF of 25% with no LGE.  He was referred to EP for ICD implantation.  ICD implant February 2024 without postoperative complications.  Remote monitoring of his ICD continue to show stable device function.    He was last seen in clinic 03/07/2023 send he been doing well.  He had recently noted a drop in his weight.  He had been continued on GDMT.  There were no medication changes that were made and no further testing needed to be ordered.  On 04 16 alert remote transmission came and ATP therapy delivered to control for an arrhythmia at 0 846 the duration was noted to be 19 seconds consistent with sustained V. tach with a heart rate of 198-212 which terminated  with 1 burst of ATP overall noted elevated heart rates with a daily rate of 113 bpm.  Patient was called and remembered having brief episodes of dizziness in the morning but no other symptoms.  No changes in medications and no missed doses.  Metoprolol  was increased to 50 mg twice daily and he was sent for labs.  Labs revealed slightly lower normal potassium and a low magnesium.  He was advised to take 40 mill equivalents of potassium and then increase potassium to 20 mill equivalents daily along with starting magnesium oxide 400 mg twice daily.  He would need to have a repeat labs completed within the following week.  He returns to clinic today stating overall he has been doing well.  Has had no further episodes of dizziness lightheadedness or noting elevated heart rates.  Stated that he denies issues with pharmacy.  Previously prescribed magnesium EKG was not done but has increased his metoprolol  and started on potassium.  Has had several issues of wondering if he is absorbing potassium as he feels as though he has noticed for potassium tablets and prior BMs.  Denies any chest pain, shortness of breath.  Continues to complain of abdominal bloating without weight gain.  Denies any hospitalizations or visits to the emergency department.  ROS: 10 point review of systems has been reviewed and considered negative except ones been listed in the HPI  Studies Reviewed: Aaron Aas        TTE 01/03/22 1. Left ventricular ejection fraction, by estimation, is 25 to 30%. The  left  ventricle has severely decreased function. The left ventricle  demonstrates global hypokinesis. The left ventricular internal cavity size  was moderately dilated. Left  ventricular diastolic parameters are consistent with Grade II diastolic  dysfunction (pseudonormalization). The average left ventricular global  longitudinal strain is -9.1 %. The global longitudinal strain is abnormal.   2. Right ventricular systolic function is normal. The  right ventricular  size is normal. Tricuspid regurgitation signal is inadequate for assessing  PA pressure.   3. Left atrial size was mildly dilated.   4. The mitral valve is normal in structure. Trivial mitral valve  regurgitation. No evidence of mitral stenosis.   5. The aortic valve is normal in structure. Aortic valve regurgitation is  trivial. Aortic valve sclerosis/calcification is present, without any  evidence of aortic stenosis.   6. Mildly dilated pulmonary artery.   7. The inferior vena cava is normal in size with greater than 50%  respiratory variability, suggesting right atrial pressure of 3 mmHg.    [facility] 05/2021   Mid LAD lesion is 20% stenosed.   LV end diastolic pressure is severely elevated.   There is no aortic valve stenosis.   Conclusions: Mild, non-obstructive coronary artery disease with 20% mid LAD stenosis.  No significant disease noted in the LCx or RCA.  Findings are consistent with non-ischemic cardiomyopathy. Severely elevated left heart filling pressures. Moderately elevated right heart and pulmonary artery pressures. Mildly-moderately reduced Fick cardiac output/index.   Recommendations: Initiate diuresis and goal directed medical therapy with metoprolol  succinate 12.5 mg daily and losartan  25 mg daily.  Further optimization of GDMT per Dr. Gollan at follow-up. Primary prevention of coronary artery disease. Risk Assessment/Calculations:             Physical Exam:   VS:  BP 118/84   Pulse (!) 103   Ht 6' 1.5" (1.867 m)   Wt 212 lb 12.8 oz (96.5 kg)   SpO2 98%   BMI 27.69 kg/m    Wt Readings from Last 3 Encounters:  06/06/23 212 lb 12.8 oz (96.5 kg)  03/28/23 209 lb 6.4 oz (95 kg)  03/07/23 218 lb (98.9 kg)    GEN: Well nourished, well developed in no acute distress NECK: No JVD; No carotid bruits CARDIAC: RRR, no murmurs, rubs, gallops RESPIRATORY:  Clear to auscultation without rales, wheezing or rhonchi  ABDOMEN: Soft, non-tender,  non-distended EXTREMITIES:  No edema; No deformity   ASSESSMENT AND PLAN: .   Chronic HFrEF/NICM with an EF of 20 to 25% by echocardiogram nonobstructive coronary artery disease by left heart catheterization.  ICD was inserted 03/2022.  He has continued to maintain stable weights.  Continued on GDMT of Jardiance  10 mg daily, Toprol -XL 50 mg twice daily, Entresto  24/26 mg twice daily and spironolactone  25 mg daily with torsemide  20 mg daily.  He has been at his potassium increased to 20 mEq daily and started on magnesium.  Nonobstructive coronary artery disease on left heart catheterization without angina.  Denies any chest pain or anginal equivalents.  He is continued on aspirin  81 mg daily and rosuvastatin  20 mg daily.  Also discussed after correction of electrolytes if he continues to have electrical abnormalities he may benefit from having ischemic evaluation as well.  Primary hypertension low blood pressure today 118/84.  Blood pressures remain stable.  He is continued on his current medication regimen.  He has been encouraged to continue to monitor his pressure 1 to 2 hours postmedication administration as well.  Mixed hyperlipidemia with  an LDL of 61 which remains at goal.  He is continued on rosuvastatin  20 mg daily.  Nonsustained ventricular tachycardia continued on Toprol -XL with increased to 50 mg twice daily.  Recent episode of V. tach which converted with ATP.  Upcoming appointment next month with recommendations forthcoming from primary EP.  He has been encouraged to continue with potassium, the pharmacy and start his magnesium explained electrolyte derangements and the impact on heart rhythms during visit today.  He will follow-up with the pharmacy as previously [facility] advised him that did not have magnesium in stock.  He is to have the labs done next week.  ICD in situ was an in person appointment in May for device check.  He has been encouraged to keep all follow-up appointments with  the EP.  Obstructive sleep apnea where he has been compliant with CPAP.       Dispo: Patient return to clinic as MD/APP in 3 months or sooner if needed.  Signed, Chandy Tarman, NP

## 2023-06-06 NOTE — Patient Instructions (Signed)
 Medication Instructions:  No changes at this time.   *If you need a refill on your cardiac medications before your next appointment, please call your pharmacy*  Lab Work: BMET & MAG next week. No appointment needed. Lab office is 8-4 and they are closed every day between 1-2 for lunch.   If you have labs (blood work) drawn today and your tests are completely normal, you will receive your results only by: MyChart Message (if you have MyChart) OR A paper copy in the mail If you have any lab test that is abnormal or we need to change your treatment, we will call you to review the results.  Testing/Procedures: None  Follow-Up: At [facility], you and your health needs are our priority.  As part of our continuing mission to provide you with exceptional heart care, our providers are all part of one team.  This team includes your primary Cardiologist (physician) and Advanced Practice Providers or APPs (Physician Assistants and Nurse Practitioners) who all work together to provide you with the care you need, when you need it.  Your next appointment:   3 month(s)  Provider:   Timothy Gollan, MD or Ronald Cockayne, NP

## 2023-06-16 ENCOUNTER — Encounter: Payer: Self-pay | Admitting: *Deleted

## 2023-06-16 ENCOUNTER — Telehealth: Payer: Self-pay | Admitting: Cardiology

## 2023-06-16 NOTE — Telephone Encounter (Signed)
*  STAT* If patient is at the pharmacy, call can be transferred to refill team.   1. Which medications need to be refilled? (please list name of each medication and dose if known)   potassium chloride  (KLOR-CON ) 10 MEQ tablet    2. Which pharmacy/location (including street and city if local pharmacy) is medication to be sent to?  [id number] - [place], [place] - [address] AT [facility] OF [address]     3. Do they need a 30 day or 90 day supply? 90 day, pt was advised to start taking 2 a day so new prescription and instructions is needed.    Pt is out of medication

## 2023-06-17 LAB — BASIC METABOLIC PANEL WITH GFR
BUN/Creatinine Ratio: 11 (ref 9–20)
BUN: 11 mg/dL (ref 6–24)
CO2: 19 mmol/L — ABNORMAL LOW (ref 20–29)
Calcium: 9.2 mg/dL (ref 8.7–10.2)
Chloride: 96 mmol/L (ref 96–106)
Creatinine, Ser: 1.01 mg/dL (ref 0.76–1.27)
Glucose: 123 mg/dL — ABNORMAL HIGH (ref 70–99)
Potassium: 3.1 mmol/L — ABNORMAL LOW (ref 3.5–5.2)
Sodium: 140 mmol/L (ref 134–144)
eGFR: 87 mL/min/{1.73_m2} (ref >59)

## 2023-06-17 LAB — MAGNESIUM: Magnesium: 1.6 mg/dL (ref 1.6–2.3)

## 2023-06-19 ENCOUNTER — Other Ambulatory Visit: Payer: Self-pay | Admitting: *Deleted

## 2023-06-19 MED ORDER — POTASSIUM CHLORIDE CRYS ER 20 MEQ PO TBCR: 20.0000 meq | EXTENDED_RELEASE_TABLET | Freq: Every day | ORAL | 0 refills | Status: DC

## 2023-06-19 NOTE — Telephone Encounter (Signed)
**Note De-identified  [place] Obfuscation** Please advise 

## 2023-06-19 NOTE — Telephone Encounter (Signed)
 Update rx sent in for correct dose with current sig per 06/02/23 lab results.

## 2023-06-22 ENCOUNTER — Other Ambulatory Visit: Payer: Self-pay

## 2023-06-22 DIAGNOSIS — E876 Hypokalemia: Secondary | ICD-10-CM

## 2023-06-28 ENCOUNTER — Ambulatory Visit: Payer: Self-pay

## 2023-07-06 DIAGNOSIS — Z95810 Presence of automatic (implantable) cardiac defibrillator: Secondary | ICD-10-CM | POA: Insufficient documentation

## 2023-07-06 NOTE — Progress Notes (Signed)
 Electrophysiology Clinic Note    Date:  07/07/2023  Patient ID:  Willie Hess, Willie Hess Nov 18, 1965, MRN [id number] PCP:  Quinton Buckler, FNP  Cardiologist:  Belva Boyden, MD HF cardiologist: Bensimhon Electrophysiologist: Boyce Byes, MD   Discussed the use of AI scribe software for clinical note transcription with the patient, who gave verbal consent to proceed.   Patient Profile    Chief Complaint: VT on ICD follow-up  History of Present Illness: Willie Hess is a 58 y.o. male with PMH notable for HFrEF, NICM, ICD in situ, NSVT, OSA on CPAP; seen today for Boyce Byes, MD for routine electrophysiology followup.   He last saw Dr. Marven Slimmer 06/2022 at which time was doing well.  Device clinic was alerted 05/2023 for tachy episode that converted with ATP. Patient did recall having brief episode of dizziness. Metop increaed to 50mg  BID.  Labs at that time revealed hypoMag and hypoK and PO repletion was increased.   He saw NP Hammock 05/2023 where he continued to feel well. Recheck of electrolytes showed more severe hypoK down to 3.1.   On follow-up today, he is not completely sure how he takes his medications or doses of those medications. He is fairly certain he is taking 20meq of potassium daily.  He denies chest pain, chest pressure, palpitations. Heart rate remains consistently high at approximately 100 beats per minute over the past two to three years. He is on metoprolol  twice daily, but does not believe it is working.  He experiences fluid retention in the abdomen, which persists despite diuretic therapy with torsemide . He is uncertain about the exact dosing regimen, possibly taking it twice daily. He previously switched from Lasix  to torsemide . Despite weight loss with Wegovy , abdominal distension continues. He denies lower extremity edema. No SOB, DOE, PND, able to sleep flat, good appetite.   He is aware that he is not supposed to be driving d/t VT with ATP in  April, but admits to driving daily so that he can work.       Arrhythmia/Device History BSX single chamber ICD, imp 03/2022; dx HFrEF    ROS:  Please see the history of present illness. All other systems are reviewed and otherwise negative.    Physical Exam    VS:  BP 120/78 (BP Location: Left Arm, Patient Position: Sitting, Cuff Size: Normal)   Pulse (!) 105   Ht 6\' 2"  (1.88 m)   Wt 215 lb 9.6 oz (97.8 kg)   SpO2 97%   BMI 27.68 kg/m  BMI: Body mass index is 27.68 kg/m.  Wt Readings from Last 3 Encounters:  07/07/23 215 lb 9.6 oz (97.8 kg)  06/06/23 212 lb 12.8 oz (96.5 kg)  03/28/23 209 lb 6.4 oz (95 kg)     GEN- The patient is well appearing, alert and oriented x 3 today.   Lungs- Clear to ausculation bilaterally, normal work of breathing.  Heart- Regular, tachycardic rate and rhythm, no murmurs, rubs or gallops. JVD at mid-neck at 30 degrees Abd - soft, non tender, easily compressible Extremities- No peripheral edema, warm, dry Skin-  device pocket well-healed, no tethering   Device interrogation done today and reviewed by myself:  Battery 13 years Lead thresholds, impedence, sensing stable  Frequent NSVT episodes  Reviewed VT episode from 4/14, ATP x 2 Histograms remained elevated.    Studies Reviewed   Previous EP, cardiology notes.    EKG is ordered. Personal review of EKG from today  shows:    EKG Interpretation Date/Time:  Friday Jul 07 2023 10:43:06 EDT Ventricular Rate:  105 PR Interval:  152 QRS Duration:  102 QT Interval:  362 QTC Calculation: 478 R Axis:   -30  Text Interpretation: Sinus tachycardia Left axis deviation Left ventricular hypertrophy with repolarization abnormality ( R in aVL , Cornell product ) Confirmed by Soyla Bainter [phone]) on 07/07/2023 10:45:23 AM    Cardiac MRI, 02/02/2022 1. Mildly dilated LV size, severely reduced LV function.  LVEF 25%.  2. There is no late gadolinium enhancement in the left ventricular  myocardium.  3. No significant valvular abnormalities.  4. No evidence for infiltrative disease.  5. No evidence for prior infarct.  6. Normal RV size and function.  7. Findings consistent with non ischemic cardiomyopathy.  TTE, 01/03/2022  1. Left ventricular ejection fraction, by estimation, is 25 to 30%. The left ventricle has severely decreased function. The left ventricle demonstrates global hypokinesis. The left ventricular internal cavity size was moderately dilated. Left ventricular diastolic parameters are consistent with Grade II diastolic dysfunction (pseudonormalization). The average left ventricular global longitudinal strain is -9.1 %. The global longitudinal strain is abnormal.   2. Right ventricular systolic function is normal. The right ventricular size is normal. Tricuspid regurgitation signal is inadequate for assessing PA pressure.   3. Left atrial size was mildly dilated.   4. The mitral valve is normal in structure. Trivial mitral valve regurgitation. No evidence of mitral stenosis.   5. The aortic valve is normal in structure. Aortic valve regurgitation is trivial. Aortic valve sclerosis/calcification is present, without any evidence of aortic stenosis.   6. Mildly dilated pulmonary artery.   7. The inferior vena cava is normal in size with greater than 50% respiratory variability, suggesting right atrial pressure of 3 mmHg.    Assessment and Plan     #) NSVT #) hypomag #) hypokalemia #) HFrEF #) ICD #) tachycardia  Patient heart rates remain quite elevated while on 50mg  toprol  BID Will start digoxin 0.25mcg daily with labwork early next week I've asked him to review medication list on AVS with home pill bottles and dosages to confirm correct, and call office to update Will update BMP, mag next week I do not appreciate fluid overload on exam, though patient strongly believes that he has abdominal distention.  Will coordinate return appt with HF MD to assess  appropriateness of RHC At this time, continue 20mg  torsemide  BID  I re-address his driving restriction, advised that per [place] DMV he is not to drive d/t VT episodes on his ICD. He verbalized understanding.           Current medicines are reviewed at length with the patient today.   The patient does not have concerns regarding his medicines.  The following changes were made today:   START 0.25mcg digoxin daily  Labs/ tests ordered today include:  Orders Placed This Encounter  Procedures   Brain natriuretic peptide   Comprehensive metabolic panel with GFR   Magnesium   Digoxin level   EKG 12-Lead     Disposition:   Lab work early next week Follow-up with Dr. Bensimhon at next available Follow up with Dr. Marven Slimmer or EP APP in 3 months   Signed, Adaline Holly, NP  07/07/23  12:52 PM  Electrophysiology [facility]

## 2023-07-07 ENCOUNTER — Ambulatory Visit: Payer: Managed Care, Other (non HMO) | Attending: Cardiology | Admitting: Cardiology

## 2023-07-07 VITALS — BP 120/78 | HR 105 | Ht 74.0 in | Wt 215.6 lb

## 2023-07-07 DIAGNOSIS — Z95810 Presence of automatic (implantable) cardiac defibrillator: Secondary | ICD-10-CM | POA: Diagnosis not present

## 2023-07-07 DIAGNOSIS — I5020 Unspecified systolic (congestive) heart failure: Secondary | ICD-10-CM | POA: Diagnosis not present

## 2023-07-07 DIAGNOSIS — I4729 Other ventricular tachycardia: Secondary | ICD-10-CM

## 2023-07-07 DIAGNOSIS — E876 Hypokalemia: Secondary | ICD-10-CM

## 2023-07-07 LAB — CUP PACEART INCLINIC DEVICE CHECK
Date Time Interrogation Session: 20250523130306
Implantable Lead Connection Status: 753985
Implantable Lead Implant Date: 20240221
Implantable Lead Location: 753860
Implantable Lead Model: 673
Implantable Lead Serial Number: 216708
Implantable Pulse Generator Implant Date: 20240221
Pulse Gen Serial Number: 321783

## 2023-07-07 MED ORDER — DIGOXIN 250 MCG PO TABS: 0.2500 mg | ORAL_TABLET | Freq: Every day | ORAL | 0 refills | Status: DC

## 2023-07-07 NOTE — Patient Instructions (Addendum)
 Medication Instructions:  Call us  with what medications you are taking at home.   START Digoxin 0.25 mg daily   *If you need a refill on your cardiac medications before your next appointment, please call your pharmacy*  Lab Work: Your provider would like for you to return on Tuesday 07/11/2023 to have the following labs drawn: BNP, CMET, MAG, DIG (hold your digoxin medication this AM).   Please go to [facility] [address] ([facility] [address], [place] [address] You do not need an appointment.  They are open from 8 am- 4:30 pm.  Lunch from 1:00 pm- 2:00 pm You do not need to be fasting.   If you have labs (blood work) drawn today and your tests are completely normal, you will receive your results only by: MyChart Message (if you have MyChart) OR A paper copy in the mail If you have any lab test that is abnormal or we need to change your treatment, we will call you to review the results.  Follow-Up: At [facility], you and your health needs are our priority.  As part of our continuing mission to provide you with exceptional heart care, our providers are all part of one team.  This team includes your primary Cardiologist (physician) and Advanced Practice Providers or APPs (Physician Assistants and Nurse Practitioners) who all work together to provide you with the care you need, when you need it.  Your next appointment:   3 month(s)  Provider:   Harvie Liner, MD    We recommend signing up for the patient portal called "MyChart".  Sign up information is provided on this After Visit Summary.  MyChart is used to connect with patients for Virtual Visits (Telemedicine).  Patients are able to view lab/test results, encounter notes, upcoming appointments, etc.  Non-urgent messages can be sent to your provider as well.   To learn more about what you can do with MyChart, go to [email or web address].   Other Instructions Please schedule to see HF clinic  - Dr. Bensimhon

## 2023-07-11 ENCOUNTER — Ambulatory Visit: Payer: Managed Care, Other (non HMO)

## 2023-07-11 ENCOUNTER — Encounter: Payer: Self-pay | Admitting: Emergency Medicine

## 2023-07-11 DIAGNOSIS — I428 Other cardiomyopathies: Secondary | ICD-10-CM

## 2023-07-11 DIAGNOSIS — I5020 Unspecified systolic (congestive) heart failure: Secondary | ICD-10-CM

## 2023-07-12 ENCOUNTER — Ambulatory Visit: Payer: Self-pay | Admitting: Cardiology

## 2023-07-12 LAB — CUP PACEART REMOTE DEVICE CHECK
Battery Remaining Longevity: 150 mo
Battery Remaining Percentage: 100 %
Brady Statistic RV Percent Paced: 0 %
Date Time Interrogation Session: 20250527065900
HighPow Impedance: 89 Ohm
Implantable Lead Connection Status: 753985
Implantable Lead Implant Date: 20240221
Implantable Lead Location: 753860
Implantable Lead Model: 673
Implantable Lead Serial Number: 216708
Implantable Pulse Generator Implant Date: 20240221
Lead Channel Impedance Value: 832 Ohm
Lead Channel Pacing Threshold Amplitude: 0.5 V
Lead Channel Pacing Threshold Pulse Width: 0.4 ms
Lead Channel Setting Pacing Amplitude: 2 V
Lead Channel Setting Pacing Pulse Width: 0.4 ms
Lead Channel Setting Sensing Sensitivity: 0.6 mV
Pulse Gen Serial Number: 321783
Zone Setting Status: 755011

## 2023-07-12 LAB — COMPREHENSIVE METABOLIC PANEL WITH GFR
ALT: 23 IU/L (ref 0–44)
AST: 22 IU/L (ref 0–40)
Albumin: 4.1 g/dL (ref 3.8–4.9)
Alkaline Phosphatase: 73 IU/L (ref 44–121)
BUN/Creatinine Ratio: 11 (ref 9–20)
BUN: 13 mg/dL (ref 6–24)
Bilirubin Total: 0.3 mg/dL (ref 0.0–1.2)
CO2: 18 mmol/L — ABNORMAL LOW (ref 20–29)
Calcium: 9.5 mg/dL (ref 8.7–10.2)
Chloride: 100 mmol/L (ref 96–106)
Creatinine, Ser: 1.16 mg/dL (ref 0.76–1.27)
Globulin, Total: 2.8 g/dL (ref 1.5–4.5)
Glucose: 112 mg/dL — ABNORMAL HIGH (ref 70–99)
Potassium: 4.1 mmol/L (ref 3.5–5.2)
Sodium: 138 mmol/L (ref 134–144)
Total Protein: 6.9 g/dL (ref 6.0–8.5)
eGFR: 73 mL/min/{1.73_m2} (ref >59)

## 2023-07-12 LAB — BRAIN NATRIURETIC PEPTIDE: BNP: 42.6 pg/mL (ref 0.0–100.0)

## 2023-07-12 LAB — DIGOXIN LEVEL: Digoxin, Serum: 0.4 ng/mL — ABNORMAL LOW (ref 0.5–0.9)

## 2023-07-12 LAB — MAGNESIUM: Magnesium: 2 mg/dL (ref 1.6–2.3)

## 2023-07-16 ENCOUNTER — Ambulatory Visit: Payer: Self-pay | Admitting: Cardiology

## 2023-07-20 ENCOUNTER — Other Ambulatory Visit: Payer: Self-pay | Admitting: Cardiovascular Disease

## 2023-08-03 ENCOUNTER — Telehealth: Payer: Self-pay | Admitting: Cardiovascular Disease

## 2023-08-03 MED ORDER — DIGOXIN 250 MCG PO TABS: 0.2500 mg | ORAL_TABLET | Freq: Every day | ORAL | 3 refills | Status: DC

## 2023-08-03 MED ORDER — ROSUVASTATIN CALCIUM 20 MG PO TABS
20.0000 mg | ORAL_TABLET | Freq: Every day | ORAL | 3 refills | Status: AC
Start: 2023-08-03 — End: ?

## 2023-08-03 NOTE — Telephone Encounter (Signed)
 RX sent to requested Pharmacy

## 2023-08-03 NOTE — Telephone Encounter (Signed)
*  STAT* If patient is at the pharmacy, call can be transferred to refill team.   1. Which medications need to be refilled? (please list name of each medication and dose if known)   digoxin  (LANOXIN ) 0.25 MG tablet    Take 1 tablet (0.25 mg total) by mouth daily.  rosuvastatin  (CRESTOR ) 20 MG tablet   TAKE 1 TABLET(20 MG) BY MOUTH DAILY   2. Would you like to learn more about the convenience, safety, & potential cost savings by using the [facility]? No   3. Are you open to using the [facility] No   4. Which pharmacy/location (including street and city if local pharmacy) is medication to be sent to? [id number] - [place], [place] - [address] AT [facility] OF [address]     5. Do they need a 30 day or 90 day supply? Rosuvastatin  90 Day Supply Digoxin  - 30 Day Supply   Pt is currently out of medications.

## 2023-08-10 ENCOUNTER — Telehealth: Payer: Self-pay | Admitting: Internal Medicine

## 2023-08-10 NOTE — Telephone Encounter (Signed)
 Called to confirm/remind patient of their appointment at [facility] on 08/11/23.   Appointment:   [x] Confirmed  [] Left mess   [] No answer/No voice mail  [] VM Full/unable to leave message  [] Phone not in service  Patient reminded to bring all medications and/or complete list.  Confirmed patient has transportation. Gave directions, instructed to utilize valet parking.

## 2023-08-11 ENCOUNTER — Ambulatory Visit: Attending: Internal Medicine | Admitting: Internal Medicine

## 2023-08-11 VITALS — BP 132/85 | HR 99 | Wt 222.0 lb

## 2023-08-11 DIAGNOSIS — R7303 Prediabetes: Secondary | ICD-10-CM | POA: Diagnosis not present

## 2023-08-11 DIAGNOSIS — E785 Hyperlipidemia, unspecified: Secondary | ICD-10-CM | POA: Diagnosis not present

## 2023-08-11 DIAGNOSIS — R0602 Shortness of breath: Secondary | ICD-10-CM | POA: Diagnosis not present

## 2023-08-11 DIAGNOSIS — Z5986 Financial insecurity: Secondary | ICD-10-CM | POA: Diagnosis not present

## 2023-08-11 DIAGNOSIS — Z87891 Personal history of nicotine dependence: Secondary | ICD-10-CM | POA: Insufficient documentation

## 2023-08-11 DIAGNOSIS — Z79899 Other long term (current) drug therapy: Secondary | ICD-10-CM | POA: Insufficient documentation

## 2023-08-11 DIAGNOSIS — I5022 Chronic systolic (congestive) heart failure: Secondary | ICD-10-CM | POA: Diagnosis not present

## 2023-08-11 DIAGNOSIS — I5020 Unspecified systolic (congestive) heart failure: Secondary | ICD-10-CM

## 2023-08-11 DIAGNOSIS — I2510 Atherosclerotic heart disease of native coronary artery without angina pectoris: Secondary | ICD-10-CM | POA: Insufficient documentation

## 2023-08-11 DIAGNOSIS — I428 Other cardiomyopathies: Secondary | ICD-10-CM | POA: Diagnosis not present

## 2023-08-11 DIAGNOSIS — G4733 Obstructive sleep apnea (adult) (pediatric): Secondary | ICD-10-CM | POA: Insufficient documentation

## 2023-08-11 DIAGNOSIS — K219 Gastro-esophageal reflux disease without esophagitis: Secondary | ICD-10-CM | POA: Diagnosis not present

## 2023-08-11 DIAGNOSIS — I472 Ventricular tachycardia, unspecified: Secondary | ICD-10-CM | POA: Diagnosis not present

## 2023-08-11 DIAGNOSIS — Z95810 Presence of automatic (implantable) cardiac defibrillator: Secondary | ICD-10-CM | POA: Insufficient documentation

## 2023-08-11 DIAGNOSIS — Z7982 Long term (current) use of aspirin: Secondary | ICD-10-CM | POA: Insufficient documentation

## 2023-08-11 MED ORDER — DIGOXIN 125 MCG PO TABS: 0.1250 mg | ORAL_TABLET | Freq: Every day | ORAL | 3 refills | Status: AC

## 2023-08-11 MED ORDER — IVABRADINE HCL 5 MG PO TABS: 2.5000 mg | ORAL_TABLET | Freq: Two times a day (BID) | ORAL | 11 refills | Status: AC

## 2023-08-11 MED ORDER — ENTRESTO 49-51 MG PO TABS: 1.0000 | ORAL_TABLET | Freq: Two times a day (BID) | ORAL | 11 refills | Status: AC

## 2023-08-11 NOTE — Progress Notes (Addendum)
 ADVANCED HF CLINIC CONSULT NOTE  Referring Physician: Medford Meager, NP Primary Care: Gareth Mliss FALCON, FNP Primary Cardiologist: Evalene Lunger, MD   HPI:  Mr. Yuan is a 58 yo male with a history of nonobstructive CAD, nonischemic cardiomyopathy, chronic HFrEF (EF 20 to 25% March 2023), hyperlipidemia, prediabetes, GERD, severe OSA (AHI 58 in 4/23), and prior tobacco abuse (quit 09-12-2019), who is referred for further evalaution of his HF.   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 ECG with LVH. +strong FHx of CAD and heart failure. Echo EF 20-25% with global HK. Cath showed mild, nonobstructive LAD disease and otherwise normal coronary arteries with elevated filling pressures (LVEDP of 40, wedge of 35), and normal cardiac output and index.  He was subsequently placed on guideline directed medical therapy and furosemide .  cMRI 12/23 LVEF 25% RVEF =51% no LGE or infiltrative process.   Zio 12/23L PVCs 1.6%. 9 runs NSVT   Father had CHF/AF (NICM) died at 72 in Sep 11, 2001.  Paternal uncle with same issues.  Oldest sister with valve surgery in 51s  Seen in EP clinic for device alert with VT with ATP. K and Mag were low. Supped. Increased Toprol .  Here for f/u. We have not seen since 1/24. Feels pretty good. Working FT at [employer] in [employer]. Gets SOB with mild activity. Feels bloated. NO CP or LE edema. Wearing CPAP. Compliant with meds     Past Medical History:  Diagnosis Date   Allergy    Arthritis    Benign neoplasm of ascending colon    Benign neoplasm of cecum    Benign neoplasm of sigmoid colon    Benign neoplasm of transverse colon    Chronic HFrEF (heart failure with reduced ejection fraction) (HCC)    a. 04/2021 Echo: EF 20-25%.   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 04/2021 Echo: EF 20-25%, glob HK, GrI DD, nl RV fxn, mod dil LA, mild MR, triv AI; b. 05/2021 Cath: nonobs dzs. LVEDP 40, RA 12, RV 50/12, PA 50/30, PCWP 35, CO/CI 4.5/2.0.   Obstructive sleep apnea    Prediabetes 09/25/2017   Preventative health care 10/12/2015   Sleep apnea     Current Outpatient Medications  Medication Sig Dispense Refill   digoxin  (LANOXIN ) 0.25 MG tablet Take 1 tablet (0.25 mg total) by mouth daily. 90 tablet 3   empagliflozin  (JARDIANCE ) 10 MG TABS tablet TAKE 1 TABLET BY MOUTH DAILY  BEFORE BREAKFAST 90 tablet 2   levocetirizine (XYZAL ) 5 MG tablet TAKE 1 TABLET(5 MG) BY MOUTH EVERY EVENING 90 tablet 1   Magnesium Oxide -Mg Supplement (MAG-OXIDE) 200 MG TABS Take 2 tablets (400 mg total) by mouth in the morning and at bedtime. 180 tablet 3   metoprolol  succinate (TOPROL -XL) 50 MG 24 hr tablet Take 1 tablet (50 mg total) by mouth 2 (two) times daily. Take with or immediately following a meal. 180 tablet 3   Omeprazole-Sodium Bicarbonate (ZEGERID) 20-1100 MG CAPS capsule Take 2 capsules by mouth daily.      potassium chloride  SA (KLOR-CON  M) 20 MEQ tablet Take 1 tablet (20 mEq total)  by mouth daily. 90 tablet 0   rosuvastatin  (CRESTOR ) 20 MG tablet Take 1 tablet (20 mg total) by mouth daily. 90 tablet 3   sacubitril-valsartan (ENTRESTO ) 24-26 MG TAKE 1 TABLET BY MOUTH TWICE  DAILY 180 tablet 2   spironolactone  (ALDACTONE ) 25 MG tablet Take 1 tablet (25 mg total) by mouth daily. 90 tablet 3   torsemide  (DEMADEX ) 20 MG tablet Take 1 tablet (20 mg total) by mouth 2 (two) times daily. 180 tablet 3   No current facility-administered medications for this visit.    Allergies  Allergen Reactions   Bee Venom Swelling      Social History   Socioeconomic History   Marital status: Married    Spouse name: Lauren   Number of children: 2   Years of education: Not on file   Highest education  level: Associate degree: occupational, Scientist, product/process development, or vocational program  Occupational History   Not on file  Tobacco Use   Smoking status: Former    Current packs/day: 0.00    Average packs/day: 1 pack/day for 36.0 years (36.0 ttl pk-yrs)    Types: Cigarettes    Start date: 28    Quit date: 2020    Years since quitting: 5.4   Smokeless tobacco: Never  Vaping Use   Vaping status: Never Used  Substance and Sexual Activity   Alcohol use: No   Drug use: No   Sexual activity: Yes    Partners: Female    Birth control/protection: Surgical  Other Topics Concern   Not on file  Social History Narrative   Works at [employer]   Social Drivers of [employer] Strain: Medium Risk (03/28/2023)   Overall Financial Resource Strain (CARDIA)    Difficulty of Paying Living Expenses: Somewhat hard  Food Insecurity: Food Insecurity Present (03/28/2023)   Hunger Vital Sign    Worried About Running Out of Food in the Last Year: Sometimes true    Ran Out of Food in the Last Year: Never true  Transportation Needs: No Transportation Needs (03/28/2023)   PRAPARE - Administrator, Civil Service (Medical): No    Lack of Transportation (Non-Medical): No  Physical Activity: Insufficiently Active (03/28/2023)   Exercise Vital Sign    Days of Exercise per Week: 2 days    Minutes of Exercise per Session: 60 min  Stress: Stress Concern Present (03/28/2023)   [employer] of Occupational Health - Occupational Stress Questionnaire    Feeling of Stress : To some extent  Social Connections: Moderately Integrated (03/28/2023)   Social Connection and Isolation Panel    Frequency of Communication with Friends and Family: More than three times a week    Frequency of Social Gatherings with Friends and Family: Once a week    Attends Religious Services: More than 4 times per year    Active Member of [employer] or Organizations: No    Attends Banker Meetings: Not on file     Marital Status: Married  Catering manager Violence: Not At Risk (03/25/2022)   Humiliation, Afraid, Rape, and Kick questionnaire    Fear of Current or Ex-Partner: No    Emotionally Abused: No    Physically Abused: No    Sexually Abused: No      Family History  Problem Relation Age of Onset   Thyroid disease Mother    Heart disease Father    Heart disease Sister    Other Brother  Lyme Disease   Prostate cancer Brother    Heart disease Paternal Grandmother    Emphysema Paternal Grandfather     Vitals:   08/11/23 1549  BP: 132/85  Pulse: 99  SpO2: 98%  Weight: 222 lb (100.7 kg)    PHYSICAL EXAM: General:  Well appearing. No resp difficulty HEENT: normal Neck: supple. no JVD. Carotids 2+ bilat; no bruits. No lymphadenopathy or thryomegaly appreciated. Cor: PMI nondisplaced. Regular rate & rhythm. No rubs, gallops or murmurs. Lungs: clear Abdomen: soft, nontender, nondistended. No hepatosplenomegaly. No bruits or masses. Good bowel sounds. Extremities: no cyanosis, clubbing, rash, edema Neuro: alert & orientedx3, cranial nerves grossly intact. moves all 4 extremities w/o difficulty. Affect pleasant  ASSESSMENT & PLAN:  1.  Chronic HFrEF/NICM:   - Onset 3/23 ECHO EF 20-25% - Cath 3/23 mild nonobstructive CAD - cMRI 12/23 LVEF 35% RVEF 51% no LGE or infiltrative process - Zio 12/23  1.6% PVCs + 9 runs NSVT - s/p BSci ICD  - ICD interogated HL score 0 No recurrent VT or AF HR 96 AL 2.2 hr/day Personally reviewed - Stable NYHA II-early III - Continue Torpol 50 bid  - Continue Jardiance  10 - Increase Entresto  24/26 bid -> 49/51 bid - Continue spiro 25 daily - Recently started on digoxin  0.25 for HR lowering per EP  - Start ivabradine  2.5 bid  - Needs ICD. Has referral to EP - Dr. Cindie 1/31 - Unclear etiology. Suspect potential familial CM. Has seen Dr. Fairy in [place]. I will need to f/u on results (I sent her a message) - Due for repeat ECHO - Recent  bloodwork reviewed 07/11/23 K 4.1 Scr 1.16   2.  VT  - s/p ICD - followed by EP  - Keep K > 4.0 Mg > 2.0  3. Non-obstructive CAD - no s/s angina - continue ASA/statin   4. Severe OSA - AHI 58 on HST 4/23 - compliant with CPAP  Toribio Fuel, MD  4:12 PM

## 2023-08-11 NOTE — Patient Instructions (Signed)
 Medication Changes:  DECREASE Digoxin  to 0.125 mg daily  INCREASE Entresto  to 49/51 mb Twice daily  START Corlanor 2.5 mg ( 1/2 Tab) Twice daily  Testing/Procedures:  Please have your echo completed. You will check in for this at the [facility]. You have to arrive 15 MINS EARLY for preparation, otherwise you will have to reschedule.  You will be called to have this test arranged   Follow-Up in: 4 months ( October) ** PLEASE CALL THE OFFICE IN AUGUST TO ARRANGE YOUR FOLLOW UP APPOINTMENT.**  At the [facility], you and your health needs are our priority. We have a designated team specialized in the treatment of Heart Failure. This Care Team includes your primary Heart Failure Specialized Cardiologist (physician), Advanced Practice Providers (APPs- Physician Assistants and Nurse Practitioners), and Pharmacist who all work together to provide you with the care you need, when you need it.   You may see any of the following providers on your designated Care Team at your next follow up:  Dr. Toribio Fuel Dr. Ezra Shuck Dr. Ria Commander Dr. Odis Brownie Ellouise Class, FNP Jaun Bash, RPH-CPP  Please be sure to bring in all your medications bottles to every appointment.   Need to Contact Us :  If you have any questions or concerns before your next appointment please send us  a message through [place] or call our office at [phone].    TO LEAVE A MESSAGE FOR THE NURSE SELECT OPTION 2, PLEASE LEAVE A MESSAGE INCLUDING: YOUR NAME DATE OF BIRTH CALL BACK NUMBER REASON FOR CALL**this is important as we prioritize the call backs  YOU WILL RECEIVE A CALL BACK THE SAME DAY AS LONG AS YOU CALL BEFORE 4:00 PM

## 2023-08-11 NOTE — Addendum Note (Signed)
 Addended by: Anna Beaird M on: 08/11/2023 04:43 PM   Modules accepted: Orders

## 2023-08-31 NOTE — Progress Notes (Signed)
 Remote ICD transmission.

## 2023-08-31 NOTE — Addendum Note (Signed)
 Addended by: TAWNI DRILLING D on: 08/31/2023 01:10 PM   Modules accepted: Orders

## 2023-09-12 ENCOUNTER — Telehealth: Payer: Self-pay | Admitting: Student

## 2023-09-12 NOTE — Telephone Encounter (Signed)
 Pt has a followup appt with Dr. Bensimhon.

## 2023-09-12 NOTE — Telephone Encounter (Signed)
*  STAT* If patient is at the pharmacy, call can be transferred to refill team.   1. Which medications need to be refilled? (please list name of each medication and dose if known) Potassium Chloride    2. Would you like to learn more about the convenience, safety, & potential cost savings by using the [facility]?   3. Are you open to using the [facility] (Type [facility].    4. Which pharmacy/location (including street and city if local pharmacy) is medication to be sent to?[facility] [place]   5. Do they need a 30 day or 90 day supply? 90 days and refills

## 2023-09-18 NOTE — Telephone Encounter (Signed)
 Patient's wife is following up regarding request because patient is completely out of medication. Please advise.\

## 2023-09-20 ENCOUNTER — Ambulatory Visit: Attending: Cardiology | Admitting: Cardiology

## 2023-09-20 ENCOUNTER — Other Ambulatory Visit: Payer: Self-pay | Admitting: Student

## 2023-09-20 ENCOUNTER — Encounter: Payer: Self-pay | Admitting: Cardiology

## 2023-09-20 VITALS — BP 115/70 | HR 93 | Ht 74.0 in | Wt 221.0 lb

## 2023-09-20 DIAGNOSIS — E782 Mixed hyperlipidemia: Secondary | ICD-10-CM

## 2023-09-20 DIAGNOSIS — I5020 Unspecified systolic (congestive) heart failure: Secondary | ICD-10-CM

## 2023-09-20 DIAGNOSIS — I428 Other cardiomyopathies: Secondary | ICD-10-CM

## 2023-09-20 DIAGNOSIS — I10 Essential (primary) hypertension: Secondary | ICD-10-CM

## 2023-09-20 DIAGNOSIS — I4729 Other ventricular tachycardia: Secondary | ICD-10-CM

## 2023-09-20 DIAGNOSIS — G4733 Obstructive sleep apnea (adult) (pediatric): Secondary | ICD-10-CM

## 2023-09-20 DIAGNOSIS — Z95810 Presence of automatic (implantable) cardiac defibrillator: Secondary | ICD-10-CM

## 2023-09-20 DIAGNOSIS — I2510 Atherosclerotic heart disease of native coronary artery without angina pectoris: Secondary | ICD-10-CM

## 2023-09-20 NOTE — Progress Notes (Signed)
 Cardiology Office Note   Date:  09/20/2023  ID:  CASMERE HOLLENBECK, DOB 1965-03-02, MRN [id number] PCP: Gareth Mliss FALCON, FNP  [facility] Providers Cardiologist:  Evalene Lunger, MD Electrophysiologist:  OLE ONEIDA HOLTS, MD     History of Present Illness Willie Hess is a 58 y.o. male with a past medical history of nonobstructive coronary disease, ischemic cardiomyopathy with chronic HFrEF (EF 20 to 25% 04/2018), hyperlipidemia, prediabetes, GERD, severe OSA, nonsustained ventricular tachycardia with ICD implantation (04/06/2022), prior tobacco use (quit 2021), who is here today to follow-up on his nonobstructive coronary disease chronic HFrEF.   Initially evaluated in March 2023 for abnormal EKG with LVH and a strong family history of CAD and heart failure.  He had complaints of fatigue and associated dyspnea on exertion with angina.  Echocardiogram revealed severely reduced LV function with an EF of 20 to 25% and global hypokinesis.  Underwent diagnostic catheterization which showed mild, nonobstructive LAD disease, otherwise normal coronary arteries with elevated filling pressure with an LVEDP of 40, wedge of 35, normal cardiac output and index.  He was treated with GDMT.  Cardiac MRI in December 2023 showed an EF of 25% with no LGE.  He was referred to EP for ICD implantation.  ICD implant February 2024 without postoperative complications.  Remote monitoring of his ICD continue to show stable device function.   Was seen in clinic 03/07/2023 was doing well.  Most recently noted a drop in his weight.  He was continued on GDMT.  There were no medication changes that were made and no further testing that was ordered.  He had an alert through remote transmission that ATP therapy was delivered to control an arrhythmia at 846 and the duration was noted to be 19 sinus again was consistent with sustained V. tach and heart rate of 198-212 which terminated with 1 burst of ATP overall noted elevated  heart rates with a daily rate of 130 bpm.  Patient was called to report having brief episodes of dizziness in the morning but no other symptoms.  He had undergone no medication changes were made and no missed doses.  Metoprolol  was increased to 50 mg twice daily and he was sent for labs.  Labs revealed slightly lower normal potassium and low magnesium and he was supplemented.  With repeat labs.  He was seen in clinic 06/06/2023 stating overall he had been doing well.  Had no further episodes of dizziness lightheadedness or noted elevated heart rates.  Had several issues 1 if he was exorbitant previously prescribed magnesium potassium that was ordered.  Denied any other associated symptoms.  He was evaluated in clinic on/23/25 by EP.  He was started on digoxin  0.25 mcg daily with lab work early next week.  Updated BMP and mag was also ordered.  He continued to have concerns of abdominal swelling even after being on diuretic therapy.  Although fluid overload was not appreciated on exam.  Follow-up was recommended with advanced heart failure to assess appropriateness of right heart catheterization.   He was last seen in clinic 08/11/2023 by Dr. Cherrie of advanced heart failure.  Entresto  was increased from 24/26 twice daily to 49/51 mg twice daily.  He was started on ivabradine  2.5 mg twice daily.  Scheduled for repeat echocardiogram. Unclear etiology and suspect potential familial CM.  They reached out to genetics.  He returns to clinic today stating overall from a cardiac perspective he has been doing well.  Continues to complain of  abdominal bloating but without any other associated symptoms of weight gain, shortness of breath, peripheral edema.  Recently followed up with advanced heart failure and was started on [employer] and Entresto  was increased.  Digoxin  was also lowered.  He states that he has done well with his current medication regimen and has not missed any doses or had any adverse side  effects.  Denies any hospitalizations or visits to the emergency department.  ROS: 10 point review of systems has been reviewed and considered negative except ones been listed in the HPI  Studies Reviewed EKG Interpretation Date/Time:  Wednesday September 20 2023 10:38:36 EDT Ventricular Rate:  93 PR Interval:  154 QRS Duration:  94 QT Interval:  332 QTC Calculation: 412 R Axis:   -25  Text Interpretation: Normal sinus rhythm Septal infarct , age undetermined ST & T wave abnormality, consider lateral ischemia When compared with ECG of 07-Jul-2023 10:43, No significant change since last tracing Confirmed by Gerard Frederick ([id number]) on 09/20/2023 10:44:23 AM    Cardiac MRI, 02/02/2022 1. Mildly dilated LV size, severely reduced LV function.  LVEF 25%.  2. There is no late gadolinium enhancement in the left ventricular myocardium.  3. No significant valvular abnormalities.  4. No evidence for infiltrative disease.  5. No evidence for prior infarct.  6. Normal RV size and function.  7. Findings consistent with non ischemic cardiomyopathy.  TTE 01/03/22 1. Left ventricular ejection fraction, by estimation, is 25 to 30%. The  left ventricle has severely decreased function. The left ventricle  demonstrates global hypokinesis. The left ventricular internal cavity size  was moderately dilated. Left  ventricular diastolic parameters are consistent with Grade II diastolic  dysfunction (pseudonormalization). The average left ventricular global  longitudinal strain is -9.1 %. The global longitudinal strain is abnormal.   2. Right ventricular systolic function is normal. The right ventricular  size is normal. Tricuspid regurgitation signal is inadequate for assessing  PA pressure.   3. Left atrial size was mildly dilated.   4. The mitral valve is normal in structure. Trivial mitral valve  regurgitation. No evidence of mitral stenosis.   5. The aortic valve is normal in structure. Aortic valve  regurgitation is  trivial. Aortic valve sclerosis/calcification is present, without any  evidence of aortic stenosis.   6. Mildly dilated pulmonary artery.   7. The inferior vena cava is normal in size with greater than 50%  respiratory variability, suggesting right atrial pressure of 3 mmHg.    [facility] 05/2021   Mid LAD lesion is 20% stenosed.   LV end diastolic pressure is severely elevated.   There is no aortic valve stenosis.   Conclusions: Mild, non-obstructive coronary artery disease with 20% mid LAD stenosis.  No significant disease noted in the LCx or RCA.  Findings are consistent with non-ischemic cardiomyopathy. Severely elevated left heart filling pressures. Moderately elevated right heart and pulmonary artery pressures. Mildly-moderately reduced Fick cardiac output/index.   Recommendations: Initiate diuresis and goal directed medical therapy with metoprolol  succinate 12.5 mg daily and losartan  25 mg daily.  Further optimization of GDMT per Dr. Gollan at follow-up. Primary prevention of coronary artery disease. Risk Assessment/Calculations           Physical Exam VS:  BP 115/70 (BP Location: Left Arm, Patient Position: Sitting, Cuff Size: Normal)   Pulse 93   Ht 6' 2 (1.88 m)   Wt 221 lb (100.2 kg)   SpO2 98%   BMI 28.37 kg/m  Wt Readings from Last 3 Encounters:  09/20/23 221 lb (100.2 kg)  08/11/23 222 lb (100.7 kg)  07/07/23 215 lb 9.6 oz (97.8 kg)    GEN: Well nourished, well developed in no acute distress NECK: No JVD; No carotid bruits CARDIAC: RRR, no murmurs, rubs, gallops RESPIRATORY:  Clear to auscultation without rales, wheezing or rhonchi  ABDOMEN: Soft, non-tender, non-distended EXTREMITIES:  No edema; No deformity   ASSESSMENT AND PLAN Chronic HFrEF/NICM with an LVEF on cardiac MRI office 25% and 25-30% on last echocardiogram completed in 2023.  ICD inserted 03/2022.  He is continue to maintain stable weights even though he has concerns of  abdominal bloating and fluid retention.  He appears to be euvolemic on exam.  Suffers from NYHA class I-II symptoms.  He is continued on GDMT of Jardiance  10 mg daily, Toprol -XL 50 mg twice daily, Entresto  49/51 mg twice daily spironolactone  25 mg daily, torsemide  20 mg twice daily.  Upcoming echocardiogram to be repeated in September and then he will need follow-up with advanced heart failure clinic.  Nonobstructive coronary artery disease on left heart catheterization without angina.  Denies any angina or anginal equivalents.  He is continue aspirin  81 mg daily and rosuvastatin  20 mg daily.  EKG reveals sinus rhythm with rate of 93 with an old septal infarct with no acute changes from prior study.  No further ischemic evaluation needed at this time.  Primary hypertension with a blood pressure of 115/70.  Blood pressure has remained stable.  He is continued on his current medication regimen.  He is also been encouraged to monitor pressures 1 to 2 hours postmedication administration as well.  Mixed hyperlipidemia with an LDL of 127.  Has upcoming labs scheduled with his PCP.  This time is for continued on rosuvastatin  20 mg daily.  Recommended goal is 70 or less.  Previously he is maintained at goal.  Questionable if he was taking his statin medication.  Nonsustained ventricular tachycardia continued on Toprol -XL 50 mg twice daily and in [place] 2.5 mg twice daily.  No further episodes of V. tach or ATP.  Upcoming appointments with EP for further evaluation and device interrogation.  ICD in situ with an inpatient appointment later in August with Dr. Cindie.  He has been encouraged to keep all follow-ups with EP.  Obstructive sleep apnea continues to be compliant with CPAP therapy       Dispo: Patient to return to clinic to see MD/APP in 6 months or sooner if needed for further evaluation.  Signed, Samir Ishaq, NP

## 2023-09-20 NOTE — Patient Instructions (Signed)
 Medication Instructions:  Your physician recommends that you continue on your current medications as directed. Please refer to the Current Medication list given to you today.   *If you need a refill on your cardiac medications before your next appointment, please call your pharmacy*  Lab Work: No labs ordered today  If you have labs (blood work) drawn today and your tests are completely normal, you will receive your results only by: MyChart Message (if you have MyChart) OR A paper copy in the mail If you have any lab test that is abnormal or we need to change your treatment, we will call you to review the results.  Testing/Procedures: No test ordered today   Follow-Up: At [facility], you and your health needs are our priority.  As part of our continuing mission to provide you with exceptional heart care, our providers are all part of one team.  This team includes your primary Cardiologist (physician) and Advanced Practice Providers or APPs (Physician Assistants and Nurse Practitioners) who all work together to provide you with the care you need, when you need it.  Your next appointment:   6 month(s)  Provider:   Timothy Gollan, MD or Tylene Lunch, NP

## 2023-10-10 ENCOUNTER — Ambulatory Visit (INDEPENDENT_AMBULATORY_CARE_PROVIDER_SITE_OTHER): Payer: Managed Care, Other (non HMO)

## 2023-10-10 DIAGNOSIS — I428 Other cardiomyopathies: Secondary | ICD-10-CM | POA: Diagnosis not present

## 2023-10-10 NOTE — Progress Notes (Deleted)
  Electrophysiology Office Follow up Visit Note:    Date:  10/10/2023   ID:  Willie Hess, DOB Hess 12, 1967, MRN [id number]  PCP:  Gareth Mliss FALCON, FNP  [facility] Cardiologist:  Evalene Lunger, MD  [facility] Electrophysiologist:  OLE ONEIDA HOLTS, MD    Interval History:     Willie Hess is a 58 y.o. male who presents for a follow up visit.   The patient last saw Willie Hess 23, 2025.  He has chronic systolic heart failure, nonischemic cardiomyopathy, ICD, nonsustained VT, sleep apnea.  At that appointment he reported confusion about his medication regimen.  He saw Dr. Cherrie August 11, 2023.      Past medical, surgical, social and family history were reviewed.  ROS:   Please see the history of present illness.    All other systems reviewed and are negative.  EKGs/Labs/Other Studies Reviewed:    The following studies were reviewed today:  October 11, 2023 in clinic device interrogation personally reviewed ***        Physical Exam:    VS:  There were no vitals taken for this visit.    Wt Readings from Last 3 Encounters:  09/20/23 221 lb (100.2 kg)  08/11/23 222 lb (100.7 kg)  07/07/23 215 lb 9.6 oz (97.8 kg)     GEN: no distress CARD: RRR, No MRG ICD pocket well-healed RESP: No IWOB. CTAB.      ASSESSMENT:    No diagnosis found. PLAN:    In order of problems listed above:  #Chronic systolic heart failure #ICD in situ NYHA class II.  Follows with Dr. Cherrie in the heart failure clinic.  Appears warm and dry on exam today.  ICD functioning appropriately.  Continue remote monitoring.  Continue digoxin , Jardiance , ivabradine , metoprolol , Entresto , spironolactone , torsemide   Follow-up with the EP APP in 12 months    Signed, OLE HOLTS, MD, [facility], [facility] 10/10/2023 9:32 AM    Electrophysiology  [facility]

## 2023-10-11 ENCOUNTER — Ambulatory Visit: Admitting: Cardiology

## 2023-10-11 LAB — CUP PACEART REMOTE DEVICE CHECK
Battery Remaining Longevity: 162 mo
Battery Remaining Percentage: 100 %
Brady Statistic RV Percent Paced: 0 %
Date Time Interrogation Session: 20250826003200
HighPow Impedance: 91 Ohm
Implantable Lead Connection Status: 753985
Implantable Lead Implant Date: 20240221
Implantable Lead Location: 753860
Implantable Lead Model: 673
Implantable Lead Serial Number: 216708
Implantable Pulse Generator Implant Date: 20240221
Lead Channel Impedance Value: 893 Ohm
Lead Channel Pacing Threshold Amplitude: 0.5 V
Lead Channel Pacing Threshold Pulse Width: 0.4 ms
Lead Channel Setting Pacing Amplitude: 2 V
Lead Channel Setting Pacing Pulse Width: 0.4 ms
Lead Channel Setting Sensing Sensitivity: 0.6 mV
Pulse Gen Serial Number: 321783
Zone Setting Status: 755011

## 2023-10-13 ENCOUNTER — Ambulatory Visit: Payer: Self-pay | Admitting: Cardiology

## 2023-10-13 ENCOUNTER — Telehealth: Payer: Self-pay | Admitting: Cardiology

## 2023-10-13 MED ORDER — MAG-OXIDE 200 MG PO TABS: 400.0000 mg | ORAL_TABLET | Freq: Two times a day (BID) | ORAL | 2 refills | Status: AC

## 2023-10-13 NOTE — Telephone Encounter (Signed)
*  STAT* If patient is at the pharmacy, call can be transferred to refill team.   1. Which medications need to be refilled? (please list name of each medication and dose if known) Magnesium Oxide -Mg Supplement (MAG-OXIDE) 200 MG TABS   Take 2 tablets (400 mg total) by mouth in the morning and at bedtime.    2. Would you like to learn more about the convenience, safety, & potential cost savings by using the [facility]? No     3. Are you open to using the [facility] No   4. Which pharmacy/location (including street and city if local pharmacy) is medication to be sent to?[id number] - [place],  - [address] AT [facility] OF [address]    5. Do they need a 30 day or 90 day supply? 90 Day Supply

## 2023-10-13 NOTE — Telephone Encounter (Signed)
 Pt's medication was sent to pt's pharmacy as requested. Confirmation received.

## 2023-11-01 NOTE — Progress Notes (Signed)
Remote ICD Transmission.

## 2023-11-21 NOTE — Progress Notes (Unsigned)
  Electrophysiology Office Follow up Visit Note:    Date:  11/22/2023   ID:  Willie Hess, DOB [birth year], MRN [id number]  PCP:  Willie Mliss FALCON, FNP  [facility] Cardiologist:  Evalene Lunger, MD  [facility] Electrophysiologist:  OLE ONEIDA HOLTS, MD    Interval History:     Willie Hess is a 58 y.o. male who presents for a follow up visit.   The patient was last seen by Willie Hess 23, 2025.  He has a history of chronic systolic heart failure secondary to nonischemic cardiomyopathy.  He has an ICD in place.  He also has sleep apnea treated with CPAP.  He was started on ivabradine  by Dr. Cherrie on August 11, 2023.  He is doing well today.  No syncope or presyncope.  Sometimes he will get a brief lightheadedness episode if he stands up too fast.  No problems with his incision.        Past medical, surgical, social and family history were reviewed.  ROS:   Please see the history of present illness.    All other systems reviewed and are negative.  EKGs/Labs/Other Studies Reviewed:    The following studies were reviewed today:  November 22, 2023 in-clinic device interrogation personally reviewed Battery and lead parameter stable No high-voltage therapies Some NSVT episodes reviewed.        Physical Exam:    VS:  BP 128/80   Pulse 96   Ht 6' 2 (1.88 m)   Wt 223 lb 12.8 oz (101.5 kg)   SpO2 98%   BMI 28.73 kg/m     Wt Readings from Last 3 Encounters:  11/22/23 223 lb 12.8 oz (101.5 kg)  09/20/23 221 lb (100.2 kg)  08/11/23 222 lb (100.7 kg)     GEN: no distress.  Overweight CARD: RRR, No MRG.  Generator pocket well-healed RESP: No IWOB. CTAB.      ASSESSMENT:    1. NICM (nonischemic cardiomyopathy) (HCC)   2. HFrEF (heart failure with reduced ejection fraction) (HCC)   3. ICD (implantable cardioverter-defibrillator) in place    PLAN:    In order of problems listed above:  #Chronic systolic heart failure #Nonischemic cardiomyopathy #ICD  in situ Device functioning appropriately.  Continue remote monitoring.  NYHA class II.  Follows with heart failure clinic.  Continue torsemide , spironolactone , Entresto , metoprolol , ivabradine , Jardiance , digoxin .  I did discuss my upcoming transition from [facility].  He will follow-up in the future with Dr. Kennyth.      Follow-up 1 year with Dr. Kennyth.   Signed, OLE HOLTS, MD, [facility], [facility] 11/22/2023 10:29 AM    Electrophysiology [facility]

## 2023-11-22 ENCOUNTER — Encounter: Payer: Self-pay | Admitting: Cardiology

## 2023-11-22 ENCOUNTER — Encounter: Payer: Self-pay | Admitting: *Deleted

## 2023-11-22 ENCOUNTER — Ambulatory Visit: Attending: Cardiology | Admitting: Cardiology

## 2023-11-22 VITALS — BP 128/80 | HR 96 | Ht 74.0 in | Wt 223.8 lb

## 2023-11-22 DIAGNOSIS — I5020 Unspecified systolic (congestive) heart failure: Secondary | ICD-10-CM

## 2023-11-22 DIAGNOSIS — I428 Other cardiomyopathies: Secondary | ICD-10-CM

## 2023-11-22 DIAGNOSIS — Z95810 Presence of automatic (implantable) cardiac defibrillator: Secondary | ICD-10-CM

## 2023-11-22 LAB — CUP PACEART INCLINIC DEVICE CHECK
Date Time Interrogation Session: 20251008113924
Implantable Lead Connection Status: 753985
Implantable Lead Implant Date: 20240221
Implantable Lead Location: 753860
Implantable Lead Model: 673
Implantable Lead Serial Number: 216708
Implantable Pulse Generator Implant Date: 20240221
Pulse Gen Serial Number: 321783

## 2023-11-22 NOTE — Patient Instructions (Signed)
 Medication Instructions:  Your physician recommends that you continue on your current medications as directed. Please refer to the Current Medication list given to you today.  *If you need a refill on your cardiac medications before your next appointment, please call your pharmacy*   Follow-Up: At [facility], you and your health needs are our priority.  As part of our continuing mission to provide you with exceptional heart care, our providers are all part of one team.  This team includes your primary Cardiologist (physician) and Advanced Practice Providers or APPs (Physician Assistants and Nurse Practitioners) who all work together to provide you with the care you need, when you need it.  Your next appointment:   1 year(s)  Provider:   Fonda Kitty, MD     Thank you for choosing [facility]!!   [phone]

## 2023-11-27 ENCOUNTER — Ambulatory Visit: Payer: Self-pay | Admitting: Cardiology

## 2023-12-18 ENCOUNTER — Telehealth: Payer: Self-pay | Admitting: Cardiovascular Disease

## 2023-12-18 ENCOUNTER — Other Ambulatory Visit: Payer: Self-pay | Admitting: Internal Medicine

## 2023-12-18 MED ORDER — TORSEMIDE 20 MG PO TABS: 20.0000 mg | ORAL_TABLET | Freq: Two times a day (BID) | ORAL | 1 refills | Status: AC

## 2023-12-18 MED ORDER — POTASSIUM CHLORIDE CRYS ER 20 MEQ PO TBCR: 20.0000 meq | EXTENDED_RELEASE_TABLET | Freq: Every day | ORAL | 1 refills | Status: AC

## 2023-12-18 NOTE — Telephone Encounter (Signed)
*  STAT* If patient is at the pharmacy, call can be transferred to refill team.   1. Which medications need to be refilled? (please list name of each medication and dose if known)   torsemide  (DEMADEX ) 20 MG tablet   potassium chloride  SA (KLOR-CON  M) 20 MEQ tablet   2. Would you like to learn more about the convenience, safety, & potential cost savings by using the [facility]? no   3. Are you open to using the [facility] (Type [facility].  ). no   4. Which pharmacy/location (including street and city if local pharmacy) is medication to be sent to? [id number] - [place], [place] - [address] AT [facility] OF [address]     5. Do they need a 30 day or 90 day supply? 90 day    Pt is out of medication

## 2023-12-18 NOTE — Telephone Encounter (Signed)
 Refill for Torsemide  and Potassium has been sent to [facility].

## 2024-01-09 ENCOUNTER — Ambulatory Visit: Payer: Managed Care, Other (non HMO)

## 2024-01-09 DIAGNOSIS — I428 Other cardiomyopathies: Secondary | ICD-10-CM | POA: Diagnosis not present

## 2024-01-10 LAB — CUP PACEART REMOTE DEVICE CHECK
Battery Remaining Longevity: 162 mo
Battery Remaining Percentage: 100 %
Brady Statistic RV Percent Paced: 0 %
Date Time Interrogation Session: 20251125004800
HighPow Impedance: 88 Ohm
Implantable Lead Connection Status: 753985
Implantable Lead Implant Date: 20240221
Implantable Lead Location: 753860
Implantable Lead Model: 673
Implantable Lead Serial Number: 216708
Implantable Pulse Generator Implant Date: 20240221
Lead Channel Impedance Value: 850 Ohm
Lead Channel Pacing Threshold Amplitude: 0.5 V
Lead Channel Pacing Threshold Pulse Width: 0.4 ms
Lead Channel Setting Pacing Amplitude: 2 V
Lead Channel Setting Pacing Pulse Width: 0.4 ms
Lead Channel Setting Sensing Sensitivity: 0.6 mV
Pulse Gen Serial Number: 321783
Zone Setting Status: 755011

## 2024-01-15 ENCOUNTER — Ambulatory Visit: Payer: Self-pay | Admitting: Cardiology

## 2024-01-15 NOTE — Progress Notes (Signed)
 Remote ICD Transmission

## 2024-02-03 ENCOUNTER — Other Ambulatory Visit: Payer: Self-pay | Admitting: Cardiology

## 2024-02-14 ENCOUNTER — Ambulatory Visit: Payer: Self-pay

## 2024-02-14 NOTE — Telephone Encounter (Signed)
 Patient states that he has to work until 5:30pm today and states he will go to Urgent Care tomorrow.  FYI Only or Action Required?: FYI only for provider: Urgent Care advised due to no openings in PCP office or offices within the surrounding region.  Patient was last seen in primary care on 03/28/2023 by Gareth Mliss FALCON, FNP.  Called Nurse Triage reporting Foot Pain.  Symptoms began 4-5 days ago.  Interventions attempted: OTC medications: Aleve  and Rest, hydration, or home remedies.  Symptoms are: gradually worsening.  Triage Disposition: See Physician Within 24 Hours  Patient/caregiver understands and will follow disposition?: Yes    Message from Zebedee SAUNDERS sent at 02/14/2024 10:10 AM EST  Summary: Left foot swollen and painful   Reason for Triage: Pt's wife Gusman,Lauren L [phone] called on behalf of pt, Lauren stated pt is at work and his left foot is swollen and painful. She ask that he is called at [phone].       Reason for Disposition  [1] Swollen foot AND [2] no fever  (Exceptions: Localized bump from bunions, calluses, insect bite, sting.)  Answer Assessment - Initial Assessment Questions Symptoms x 4-5 days Left foot starting hurting near the base of his toes.   Swelling noted now just near the base of his toes---3 middle toes slightly swollen Pain on the ball of foot in this area underneath when walking Patient denies any known injuries or redness, bruising, cool temperature, fevers,  Tender to palpation 3-4 out of 10 pain when resting and 9 when stepping on it He has been walking on his heel. Patient has tried Aleve  over the counter but it doesn't help much  There are no available appointments at the patient's PCP office or within the surrounding offices within the region.  Patient is advised Urgent Care at this time.  Patient is advised to call back if anything changes or if they have any further questions/concerns.  Patient is also advised that if  symptoms worse, to go to the Emergency Room.  Patient verbalized understanding.  Patient states that he has to work until 5:30pm today and states he will go to Urgent Care tomorrow.  Protocols used: Foot Pain-A-AH

## 2024-02-15 ENCOUNTER — Ambulatory Visit
Admission: EM | Admit: 2024-02-15 | Discharge: 2024-02-15 | Disposition: A | Discharge status: Home / Self Care | Attending: Emergency Medicine | Admitting: Emergency Medicine

## 2024-02-15 DIAGNOSIS — M79672 Pain in left foot: Secondary | ICD-10-CM

## 2024-02-15 DIAGNOSIS — M109 Gout, unspecified: Secondary | ICD-10-CM | POA: Diagnosis not present

## 2024-02-15 MED ORDER — PREDNISONE 10 MG (21) PO TBPK: ORAL_TABLET | Freq: Every day | ORAL | 0 refills | Status: DC

## 2024-02-15 NOTE — ED Provider Notes (Signed)
 " Willie Hess    CSN: [id number] Arrival date & time: 02/15/24  1221      History   Chief Complaint Chief Complaint  Patient presents with   Foot Pain    HPI Willie Hess is a 59 y.o. male.  Patient presents with 5-day history of left foot pain, swelling, redness at the base of his middle toes.  No falls or injury.  No open wounds, fever, chills, numbness, weakness.  He has been treating his symptoms with ibuprofen; none today.  He reports no history of gout or foot problems.  The history is provided by the patient and medical records.    Past Medical History:  Diagnosis Date   Allergy    Arthritis    Benign neoplasm of ascending colon    Benign neoplasm of cecum    Benign neoplasm of sigmoid colon    Benign neoplasm of transverse colon    Chronic HFrEF (heart failure with reduced ejection fraction) (HCC)    a. 04/2021 Echo: EF 20-25%.   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 04/2021 Echo: EF 20-25%, glob HK, GrI DD, nl RV fxn, mod dil LA, mild MR, triv AI; b. 05/2021 Cath: nonobs dzs. LVEDP 40, RA 12, RV 50/12, PA 50/30, PCWP 35, CO/CI 4.5/2.0.   Obstructive sleep apnea    Prediabetes 09/25/2017   Preventative health care 10/12/2015   Sleep apnea     Patient Active Problem List   Diagnosis Date Noted   ICD (implantable cardioverter-defibrillator) in place 07/06/2023   Nonischemic cardiomyopathy (HCC) 04/06/2022   Nonsustained ventricular tachycardia (HCC) 04/06/2022   OSA (obstructive sleep apnea) 06/15/2021   HFrEF (heart failure with reduced ejection fraction) (HCC) 05/25/2021   Excessive daytime sleepiness 04/29/2021   History of colonic polyps    Polyp of transverse colon    Hyperlipidemia 04/28/2020   Prediabetes 09/25/2017   Hx of smoking  07/27/2017   Benign neoplasm of cecum    Benign neoplasm of ascending colon    Benign neoplasm of transverse colon    Benign neoplasm of sigmoid colon    GERD without esophagitis 12/11/2014    Past Surgical History:  Procedure Laterality Date   COLONOSCOPY WITH PROPOFOL  N/A 11/12/2015   Procedure: COLONOSCOPY WITH PROPOFOL ;  Surgeon: Rogelia Copping, MD;  Location: [facility];  Service: Endoscopy;  Laterality: N/A;   COLONOSCOPY WITH PROPOFOL  N/A 04/20/2021   Procedure: COLONOSCOPY WITH PROPOFOL ;  Surgeon: Copping Rogelia, MD;  Location: [facility] END[facility]COPY;  Service: Endoscopy;  Laterality: N/A;   ESOPHAGOGASTRODUODENOSCOPY     HERNIA REPAIR     ICD IMPLANT N/A 04/06/2022   Procedure: ICD IMPLANT;  Surgeon: Cindie Ole DASEN, MD;  Location: [facility] INVASIVE CV LAB;  Service: Cardiovascular;  Laterality: N/A;   INSERTION OF MESH Left 08/24/2017   Procedure: INSERTION OF MESH;  Surgeon: Jordis Laneta FALCON, MD;  Location: [facility] ORS;  Service: General;  Laterality: Left;   POLYPECTOMY  11/12/2015   Procedure: POLYPECTOMY;  Surgeon: Rogelia Copping, MD;  Location: [facility];  Service: Endoscopy;;   RIGHT/LEFT HEART CATH AND CORONARY ANGIOGRAPHY  05/25/2021   Procedure: RIGHT/LEFT HEART CATH AND CORONARY ANGIOGRAPHY;  Surgeon: Mady Bruckner, MD;  Location: [facility]  INVASIVE CV LAB;  Service: Cardiovascular;;   ROBOT ASSISTED INGUINAL HERNIA REPAIR Left 08/24/2017   Procedure: ROBOT ASSISTED INGUINAL HERNIA REPAIR;  Surgeon: Jordis Laneta FALCON, MD;  Location: [facility] ORS;  Service: General;  Laterality: Left;   URETEROSCOPY WITH HOLMIUM LASER LITHOTRIPSY Left 09/23/2014   Procedure: URETEROSCOPY, CYSTOSCOPY WITH STENT PLACEMENT, HOLMIUM STAND BY ;  Surgeon: Ozell JONELLE Burkes, MD;  Location: [facility] ORS;  Service: Urology;  Laterality: Left;   VASECTOMY     WISDOM TOOTH EXTRACTION         Home Medications    Prior to Admission medications  Medication Sig Start Date End Date Taking? Authorizing Provider   predniSONE (STERAPRED UNI-PAK 21 TAB) 10 MG (21) TBPK tablet Take by mouth daily. As directed 02/15/24  Yes Corlis Burnard DEL, NP  digoxin  (LANOXIN ) 0.125 MG tablet Take 1 tablet (0.125 mg total) by mouth daily. 08/11/23   Bensimhon, Toribio JONELLE, MD  empagliflozin  (JARDIANCE ) 10 MG TABS tablet TAKE 1 TABLET BY MOUTH DAILY  BEFORE BREAKFAST 07/20/23   Gollan, Timothy J, MD  ivabradine  (CORLANOR) 5 MG TABS tablet Take 0.5 tablets (2.5 mg total) by mouth 2 (two) times daily with a meal. 08/11/23   Bensimhon, Toribio JONELLE, MD  levocetirizine (XYZAL ) 5 MG tablet TAKE 1 TABLET(5 MG) BY MOUTH EVERY EVENING 07/27/22   Pender, Julie F, FNP  Magnesium Oxide -Mg Supplement (MAG-OXIDE) 200 MG TABS Take 2 tablets (400 mg total) by mouth in the morning and at bedtime. 10/13/23   Cindie Ole DASEN, MD  metoprolol  succinate (TOPROL -XL) 50 MG 24 hr tablet Take 1 tablet (50 mg total) by mouth 2 (two) times daily. Take with or immediately following a meal. 06/06/23   Hammock, Tylene, NP  Omeprazole-Sodium Bicarbonate (ZEGERID) 20-1100 MG CAPS capsule Take 2 capsules by mouth daily.     [provider]  potassium chloride  SA (KLOR-CON  M) 20 MEQ tablet Take 1 tablet (20 mEq total) by mouth daily. 12/18/23   Gerard Tylene, NP  rosuvastatin  (CRESTOR ) 20 MG tablet Take 1 tablet (20 mg total) by mouth daily. 08/03/23   Gollan, Timothy J, MD  sacubitril-valsartan (ENTRESTO ) 49-51 MG Take 1 tablet by mouth 2 (two) times daily. 08/11/23   Bensimhon, Toribio JONELLE, MD  spironolactone  (ALDACTONE ) 25 MG tablet Take 1 tablet (25 mg total) by mouth daily. 04/28/23   Gollan, Timothy J, MD  torsemide  (DEMADEX ) 20 MG tablet Take 1 tablet (20 mg total) by mouth 2 (two) times daily. 12/18/23   Gerard Tylene, NP    Family History Family History  Problem Relation Age of Onset   Thyroid disease Mother    Heart disease Father    Heart disease Sister    Other Brother        Lyme Disease   Prostate cancer Brother    Heart disease Paternal Grandmother     Emphysema Paternal Grandfather     Social History Social History[1]   Allergies   Bee venom   Review of Systems Review of Systems  Constitutional:  Negative for chills and fever.  Musculoskeletal:  Positive for arthralgias, gait problem and joint swelling.  Skin:  Positive for color change. Negative for wound.  Neurological:  Negative for weakness and numbness.     Physical Exam Triage Vital Signs ED Triage Vitals  Encounter Vitals Group     BP 02/15/24 1241 106/71     Girls Systolic BP Percentile --      Girls Diastolic BP Percentile --  Boys Systolic BP Percentile --      Boys Diastolic BP Percentile --      Pulse Rate 02/15/24 1241 84     Resp 02/15/24 1241 18     Temp 02/15/24 1241 97.9 F (36.6 C)     Temp src --      SpO2 02/15/24 1241 97 %     Weight --      Height --      Head Circumference --      Peak Flow --      Pain Score 02/15/24 1239 7     Pain Loc --      Pain Education --      Exclude from Growth Chart --    No data found.  Updated Vital Signs BP 106/71   Pulse 84   Temp 97.9 F (36.6 C)   Resp 18   SpO2 97%   Visual Acuity Right Eye Distance:   Left Eye Distance:   Bilateral Distance:    Right Eye Near:   Left Eye Near:    Bilateral Near:     Physical Exam Constitutional:      General: He is not in acute distress. HENT:     Mouth/Throat:     Mouth: Mucous membranes are moist.  Cardiovascular:     Rate and Rhythm: Normal rate.  Pulmonary:     Effort: Pulmonary effort is normal. No respiratory distress.  Musculoskeletal:        General: Swelling and tenderness present. No deformity. Normal range of motion.       Feet:  Skin:    General: Skin is warm and dry.     Capillary Refill: Capillary refill takes less than 2 seconds.     Findings: Erythema present. No bruising or lesion.  Neurological:     General: No focal deficit present.     Mental Status: He is alert.     Sensory: No sensory deficit.     Motor: No  weakness.     Gait: Gait abnormal.     Comments: Limping gait.      UC Treatments / Results  Labs (all labs ordered are listed, but only abnormal results are displayed) Labs Reviewed - No data to display  EKG   Radiology No results found.  Procedures Procedures (including critical care time)  Medications Ordered in UC Medications - No data to display  Initial Impression / Assessment and Plan / UC Course  I have reviewed the triage vital signs and the nursing notes.  Pertinent labs & imaging results that were available during my care of the patient were reviewed by me and considered in my medical decision making (see chart for details).   Left foot pain, gout of left foot.  Afebrile and vital signs are stable.  No trauma.  No wounds or fever.  Patient does not have history of gout but his current symptoms appear to be gout.  Treating today with prednisone taper.  Instructed patient to follow-up with his PCP or a podiatrist.  Contact information for on-call podiatrist along with contact information for [facility] provided.  ED precautions given.  Education provided on foot pain and gout.  Patient agrees to plan of care.    Final Clinical Impressions(s) / UC Diagnoses   Final diagnoses:  Left foot pain  Acute gout of left foot, unspecified cause     Discharge Instructions      Take the prednisone taper as directed.  Follow up with your primary care provider or a podiatrist.  Go to the emergency department if you have worsening symptoms.      [facility] [address], [place], [place] [address] Phone: [phone]      ED Prescriptions     Medication Sig Dispense Auth. Provider   predniSONE (STERAPRED UNI-PAK 21 TAB) 10 MG (21) TBPK tablet Take by mouth daily. As directed 21 tablet Corlis Burnard DEL, NP      PDMP not reviewed this encounter.    [1]  Social History Tobacco Use   Smoking status: Former    Current packs/day: 0.00     Average packs/day: 1 pack/day for 36.0 years (36.0 ttl pk-yrs)    Types: Cigarettes    Start date: 45    Quit date: 2020    Years since quitting: 6.0   Smokeless tobacco: Never  Vaping Use   Vaping status: Never Used  Substance Use Topics   Alcohol use: No   Drug use: No     Corlis Burnard DEL, NP 02/15/24 1330  "

## 2024-02-15 NOTE — ED Triage Notes (Signed)
 Patient to Urgent Care with complaints of left sided foot pain/ swelling. No known injury.  Redness and swelling across toes.   Symptoms started Saturday.

## 2024-02-15 NOTE — Discharge Instructions (Addendum)
 Take the prednisone taper as directed.  Follow up with your primary care provider or a podiatrist.  Go to the emergency department if you have worsening symptoms.      [facility] [address], [place], [place] [address] Phone: [phone]

## 2024-02-29 ENCOUNTER — Ambulatory Visit: Admitting: Nurse Practitioner

## 2024-02-29 VITALS — BP 130/80 | HR 104 | Temp 98.0°F | Ht 74.0 in | Wt 222.0 lb

## 2024-02-29 DIAGNOSIS — M109 Gout, unspecified: Secondary | ICD-10-CM

## 2024-02-29 MED ORDER — ALLOPURINOL 100 MG PO TABS: 100.0000 mg | ORAL_TABLET | Freq: Every day | ORAL | 1 refills | Status: AC

## 2024-02-29 MED ORDER — COLCHICINE 0.6 MG PO TABS: ORAL_TABLET | ORAL | 0 refills | Status: DC

## 2024-02-29 NOTE — Progress Notes (Signed)
 "  BP 130/80   Pulse (!) 104   Temp 98 F (36.7 C)   Ht 6' 2 (1.88 m)   Wt 222 lb (100.7 kg)   SpO2 95%   BMI 28.50 kg/m    Subjective:    Patient ID: Willie Hess, male    DOB: 02-Sep-1965, 59 y.o.   MRN: [id number]  HPI: Willie Hess is a 59 y.o. male  Chief Complaint  Patient presents with   Foot Pain    Pt c/o left foot pain x2 weeks. Denies injury.    Discussed the use of AI scribe software for clinical note transcription with the patient, who gave verbal consent to proceed.  History of Present Illness Willie Hess is a 59 year old male who presents with left foot pain. He is accompanied by his partner, who is the driver.  Left foot pain - Acute onset two weeks ago while at work - Initially localized at the base of the middle toes, now radiating to the great toe - Pain severity is significant, interfering with sleep and comfort - No history of trauma to the foot - No prior history of gout  Swelling and functional limitation - Swelling began shortly after pain onset and has persisted - Swelling prevents bending of the great toe  Associated symptoms - No open wounds - No fever or chills - No numbness or weakness  Prior evaluation and treatment - Evaluated at urgent care on day of onset - Prescribed prednisone , which provided partial relief but did not resolve pain - Taking ibuprofen for pain management  Pain comparison - History of severe migraines in youth, described as the worst pain experienced - Current foot pain is the second worst pain experienced         02/29/2024    9:19 AM 02/27/2023    1:35 PM 04/27/2022    2:25 PM  Depression screen PHQ 2/9  Decreased Interest 0 0 0  Down, Depressed, Hopeless 0 0 0  PHQ - 2 Score 0 0 0  Altered sleeping  0 0  Tired, decreased energy  0 0  Change in appetite  0 0  Feeling bad or failure about yourself   0 0  Trouble concentrating  0 0  Moving slowly or fidgety/restless  0 0  Suicidal thoughts  0 0   PHQ-9 Score  0  0   Difficult doing work/chores  Not difficult at all Not difficult at all     Data saved with a previous flowsheet row definition    Relevant past medical, surgical, family and social history reviewed and updated as indicated. Interim medical history since our last visit reviewed. Allergies and medications reviewed and updated.  Review of Systems  Ten systems reviewed and is negative except as mentioned in HPI      Objective:      BP 130/80   Pulse (!) 104   Temp 98 F (36.7 C)   Ht 6' 2 (1.88 m)   Wt 222 lb (100.7 kg)   SpO2 95%   BMI 28.50 kg/m    Wt Readings from Last 3 Encounters:  02/29/24 222 lb (100.7 kg)  11/22/23 223 lb 12.8 oz (101.5 kg)  09/20/23 221 lb (100.2 kg)    Physical Exam GENERAL: Alert, cooperative, well developed, no acute distress. HEENT: Normocephalic, normal oropharynx, moist mucous membranes. CHEST: Clear to auscultation bilaterally, no wheezes, rhonchi, or crackles. CARDIOVASCULAR: Normal heart rate and rhythm, S1 and S2 normal  without murmurs. ABDOMEN: Soft, non-tender, non-distended, without organomegaly, normal bowel sounds. EXTREMITIES: No cyanosis or edema, swelling at the base of the left middle toes redness, tenderness to the left great toe NEUROLOGICAL: Cranial nerves grossly intact, moves all extremities without gross motor or sensory deficit.  Results for orders placed or performed in visit on 01/09/24  CUP PACEART REMOTE DEVICE CHECK   Collection Time: 01/09/24 12:48 AM  Result Value Ref Range   Date Time Interrogation Session [id number]    Pulse Generator Manufacturer BOST    Pulse Gen Model D232 VIGILANT EL ICD    Pulse Gen Serial Number [id number]    Clinic Name [facility]    Implantable Pulse Generator Type Implantable Cardiac Defibulator    Implantable Pulse Generator Implant Date 79759778    Implantable Lead Manufacturer BOST    Implantable Lead Model [id number] [employer] 4-Front S    Implantable Lead  Serial Number [id number]    Implantable Lead Implant Date 79759778    Implantable Lead Location Detail 1 UNKNOWN    Implantable Lead Location [id number]    Implantable Lead Connection Status [id number]    Lead Channel Setting Sensing Sensitivity 0.6 mV   Lead Channel Setting Sensing Adaptation Mode Adaptive Sensing    Lead Channel Setting Pacing Pulse Width 0.4 ms   Lead Channel Setting Pacing Amplitude 2.0 V   Zone Setting Status Active    Zone Setting Status [phone]    Lead Channel Impedance Value 850 ohm   Lead Channel Pacing Threshold Amplitude 0.5 V   Lead Channel Pacing Threshold Pulse Width 0.4 ms   HighPow Impedance 88 ohm   Battery Status BOS    Battery Remaining Longevity 162 mo   Battery Remaining Percentage 100 %   Brady Statistic RV Percent Paced 0 %          Assessment & Plan:   Problem List Items Addressed This Visit   None Visit Diagnoses       Acute gout of left foot, unspecified cause    -  Primary   Relevant Medications   allopurinol  (ZYLOPRIM ) 100 MG tablet   colchicine  0.6 MG tablet   Other Relevant Orders   Comprehensive metabolic panel with GFR   Uric acid        Assessment and Plan Assessment & Plan Acute gout of left foot Acute gout of the left foot with pain, swelling, and redness at the base of the middle toes for two weeks. No trauma, open wounds, fever, chills, numbness, or weakness. Initial treatment with ibuprofen and prednisone  provided partial relief. Differential diagnosis included arthritis, but presentation is consistent with gout. Colchicine  prescribed to manage acute symptoms. Allopurinol  to be initiated post-resolution to prevent future attacks. Uric acid levels to be checked post-resolution to assess treatment efficacy. - Prescribed colchicine : take one tablet initially, then another tablet 1-2 hours later if pain persists, and a third tablet if needed, not exceeding three tablets per day. - Will prescribe allopurinol  to be started after  resolution of acute symptoms to prevent future gout attacks. - Ordered lab work to check uric acid levels after resolution of acute symptoms. - Provided dietary guidance to avoid triggers such as alcohol and lunch meats.        Follow up plan: Return if symptoms worsen or fail to improve. "

## 2024-03-15 ENCOUNTER — Other Ambulatory Visit: Payer: Self-pay | Admitting: Nurse Practitioner

## 2024-03-15 ENCOUNTER — Encounter: Payer: Self-pay | Admitting: Nurse Practitioner

## 2024-03-15 DIAGNOSIS — M109 Gout, unspecified: Secondary | ICD-10-CM

## 2024-03-15 MED ORDER — COLCHICINE 0.6 MG PO TABS: ORAL_TABLET | ORAL | 0 refills | Status: AC

## 2024-03-15 MED ORDER — PREDNISONE 20 MG PO TABS: 40.0000 mg | ORAL_TABLET | Freq: Every day | ORAL | 0 refills | Status: AC

## 2024-03-18 ENCOUNTER — Encounter: Payer: Self-pay | Admitting: Nurse Practitioner

## 2024-03-29 ENCOUNTER — Encounter: Payer: Managed Care, Other (non HMO) | Admitting: Nurse Practitioner

## 2024-07-09 ENCOUNTER — Encounter

## 2024-10-08 ENCOUNTER — Encounter

## 2025-01-07 ENCOUNTER — Encounter

## 2025-04-08 ENCOUNTER — Encounter

## 2025-07-08 ENCOUNTER — Encounter
